# Patient Record
Sex: Male | Born: 1945 | Race: White | Hispanic: No
Health system: Southern US, Community
[De-identification: ages and names within clinical notes are randomized; demographics above are authoritative.]

## PROBLEM LIST (undated history)

## (undated) DIAGNOSIS — E7800 Pure hypercholesterolemia, unspecified: Secondary | ICD-10-CM

## (undated) DIAGNOSIS — K219 Gastro-esophageal reflux disease without esophagitis: Secondary | ICD-10-CM

## (undated) DIAGNOSIS — E119 Type 2 diabetes mellitus without complications: Secondary | ICD-10-CM

## (undated) DIAGNOSIS — I639 Cerebral infarction, unspecified: Secondary | ICD-10-CM

## (undated) DIAGNOSIS — IMO0001 Reserved for inherently not codable concepts without codable children: Secondary | ICD-10-CM

## (undated) DIAGNOSIS — I10 Essential (primary) hypertension: Secondary | ICD-10-CM

## (undated) DIAGNOSIS — K922 Gastrointestinal hemorrhage, unspecified: Secondary | ICD-10-CM

## (undated) DIAGNOSIS — M3313 Other dermatomyositis without myopathy: Secondary | ICD-10-CM

## (undated) DIAGNOSIS — K2970 Gastritis, unspecified, without bleeding: Secondary | ICD-10-CM

## (undated) DIAGNOSIS — K649 Unspecified hemorrhoids: Secondary | ICD-10-CM

## (undated) DIAGNOSIS — M3390 Dermatopolymyositis, unspecified, organ involvement unspecified: Secondary | ICD-10-CM

## (undated) HISTORY — DX: Other dermatomyositis without myopathy: M33.13

## (undated) HISTORY — DX: Cerebral infarction, unspecified: I63.9

## (undated) HISTORY — DX: Dermatopolymyositis, unspecified, organ involvement unspecified: M33.90

## (undated) HISTORY — DX: Type 2 diabetes mellitus without complications: E11.9

## (undated) HISTORY — DX: Gastritis, unspecified, without bleeding: K29.70

## (undated) HISTORY — DX: Gastrointestinal hemorrhage, unspecified: K92.2

---

## 2001-07-01 ENCOUNTER — Ambulatory Visit (HOSPITAL_COMMUNITY): Admission: RE | Admit: 2001-07-01 | Discharge: 2001-07-01 | Payer: Self-pay | Admitting: Family Medicine

## 2001-07-01 ENCOUNTER — Encounter: Payer: Self-pay | Admitting: Family Medicine

## 2001-07-04 ENCOUNTER — Ambulatory Visit (HOSPITAL_COMMUNITY): Admission: RE | Admit: 2001-07-04 | Discharge: 2001-07-04 | Payer: Self-pay | Admitting: Family Medicine

## 2001-07-04 ENCOUNTER — Encounter: Payer: Self-pay | Admitting: Family Medicine

## 2001-07-12 ENCOUNTER — Ambulatory Visit (HOSPITAL_COMMUNITY): Admission: RE | Admit: 2001-07-12 | Discharge: 2001-07-12 | Payer: Self-pay | Admitting: Family Medicine

## 2001-07-12 ENCOUNTER — Encounter: Payer: Self-pay | Admitting: Family Medicine

## 2003-08-03 ENCOUNTER — Ambulatory Visit (HOSPITAL_COMMUNITY): Admission: RE | Admit: 2003-08-03 | Discharge: 2003-08-03 | Payer: Self-pay | Admitting: Family Medicine

## 2003-10-19 ENCOUNTER — Ambulatory Visit (HOSPITAL_COMMUNITY): Admission: RE | Admit: 2003-10-19 | Discharge: 2003-10-19 | Payer: Self-pay | Admitting: Internal Medicine

## 2004-08-13 ENCOUNTER — Other Ambulatory Visit: Admission: RE | Admit: 2004-08-13 | Discharge: 2004-08-13 | Payer: Self-pay | Admitting: Dermatology

## 2006-08-09 ENCOUNTER — Ambulatory Visit (HOSPITAL_COMMUNITY): Admission: RE | Admit: 2006-08-09 | Discharge: 2006-08-09 | Payer: Self-pay | Admitting: Family Medicine

## 2007-07-04 ENCOUNTER — Ambulatory Visit (HOSPITAL_COMMUNITY): Admission: RE | Admit: 2007-07-04 | Discharge: 2007-07-04 | Payer: Self-pay | Admitting: *Deleted

## 2007-08-22 ENCOUNTER — Ambulatory Visit (HOSPITAL_COMMUNITY): Admission: RE | Admit: 2007-08-22 | Discharge: 2007-08-22 | Payer: Self-pay | Admitting: Family Medicine

## 2009-12-12 HISTORY — PX: COLONOSCOPY: SHX174

## 2009-12-12 HISTORY — PX: ESOPHAGOGASTRODUODENOSCOPY: SHX1529

## 2009-12-20 ENCOUNTER — Encounter (INDEPENDENT_AMBULATORY_CARE_PROVIDER_SITE_OTHER): Payer: Self-pay

## 2009-12-20 ENCOUNTER — Inpatient Hospital Stay (HOSPITAL_COMMUNITY): Admission: EM | Admit: 2009-12-20 | Discharge: 2009-12-22 | Payer: Self-pay | Source: Home / Self Care

## 2010-01-16 ENCOUNTER — Encounter: Payer: Self-pay | Admitting: Gastroenterology

## 2010-01-16 ENCOUNTER — Ambulatory Visit
Admission: RE | Admit: 2010-01-16 | Discharge: 2010-01-16 | Payer: Self-pay | Source: Home / Self Care | Attending: Gastroenterology | Admitting: Gastroenterology

## 2010-01-16 DIAGNOSIS — K922 Gastrointestinal hemorrhage, unspecified: Secondary | ICD-10-CM | POA: Insufficient documentation

## 2010-01-16 DIAGNOSIS — K219 Gastro-esophageal reflux disease without esophagitis: Secondary | ICD-10-CM | POA: Insufficient documentation

## 2010-01-29 ENCOUNTER — Encounter (INDEPENDENT_AMBULATORY_CARE_PROVIDER_SITE_OTHER): Payer: Self-pay

## 2010-01-29 LAB — CONVERTED CEMR LAB
Basophils Relative: 1 % (ref 0–1)
Eosinophils Absolute: 0.2 10*3/uL (ref 0.0–0.7)
Eosinophils Relative: 1 % (ref 0–5)
HCT: 36.5 % — ABNORMAL LOW (ref 39.0–52.0)
Lymphs Abs: 2.4 10*3/uL (ref 0.7–4.0)
MCHC: 31 g/dL (ref 30.0–36.0)
MCV: 88.8 fL (ref 78.0–100.0)
Platelets: 357 10*3/uL (ref 150–400)
WBC: 14.6 10*3/uL — ABNORMAL HIGH (ref 4.0–10.5)

## 2010-02-04 ENCOUNTER — Telehealth (INDEPENDENT_AMBULATORY_CARE_PROVIDER_SITE_OTHER): Payer: Self-pay

## 2010-02-13 NOTE — Progress Notes (Signed)
Summary: phone note/ in response to letter received  Phone Note Call from Patient   Caller: Patient Summary of Call: Pt received letter and returned call. (See lab and appends 01/16/2010 ) York Spaniel he was sick about that time, with a head cold that moved to his chest...Marland Kitchenhe had a cough for couple of weeks but is much improved now. Appreciated [place] checking on  him. Initial call taken by: Cloria Spring LPN,  February 04, 2010 10:23 AM

## 2010-02-13 NOTE — Assessment & Plan Note (Signed)
Summary: pt seen in ER recently,had EGD + TCS/told to fu in 2-4 weeks/ss   Visit Type:  Follow-up Visit Referring Provider:  ER Primary Care Provider:  Phillips Odor  CC:  F/U procedures.  History of Present Illness: Mr. David Pruitt is a pleasant 65 year old male who presents in hospital follow-up. was in [facility] from 12/9-12/11 secondary to lower GI bleed. Underwent upper endoscopy on 12/9 showing gastritis/duodenitis. Subsequently underwent colonoscopy 12/10 where source of bleed was noted to be secondary to diverticula, requiring clips. Biopsy negative for H. pylori. H/H stable at discharge at 9.6./28.3. Pt reports doing well. Has occasional reflux at night. Taking Protonix each morning. rare reflux during day. has tried pepcid in past and other medications which he is unsure about the name. Denies dysphagia/odynophagia. No melena or hematochezia.   Current Medications (verified): 1)  Plavix 75 Mg Tabs (Clopidogrel Bisulfate) .... Take 1 Tablet By Mouth Once A Day 2)  Januvia/met 1000mg  .... Take 1 Tablet By Mouth Two Times A Day 3)  Bp Med .... One Tablet Daily 4)  Cholesterol Med .... One Tablet Daily  Allergies (verified): No Known Drug Allergies  Past History:  Past Medical History: CVA DM EGD/colon: Dec 2011: gastritis, duodenitis, lower GI bleed secondary to diverticula, s/p bleed  Past Surgical History: None  Family History: +prostate ca No FH of colon ca or polyps  Social History: Married 2 biological children, 2 step-children Denies tobacco use ETOH once/week  Review of Systems General:  Denies fever, chills, and anorexia. Eyes:  Denies blurring, irritation, and discharge. ENT:  Denies sore throat, hoarseness, and difficulty swallowing. CV:  Denies chest pains and syncope. Resp:  Denies dyspnea at rest and wheezing. GI:  Complains of indigestion/heartburn; denies difficulty swallowing, pain on swallowing, nausea, abdominal pain, diarrhea, constipation, change in bowel  habits, and bloody BM's. GU:  Denies urinary burning, blood in urine, and urinary frequency. MS:  Denies joint pain / LOM, joint swelling, and joint stiffness. Derm:  Denies rash, itching, and dry skin. Neuro:  Denies weakness and syncope. Psych:  Denies depression and anxiety. Endo:  Denies cold intolerance and heat intolerance. Heme:  Denies bruising and bleeding.  Vital Signs:  Patient profile:   65 year old male Height:      70.5 inches Weight:      233 pounds BMI:     33.08 Temp:     98.9 degrees F oral Pulse rate:   76 / minute BP sitting:   130 / 92  (left arm) Cuff size:   large  Vitals Entered By: Cloria Spring LPN (January 16, 2010 11:24 AM)  Impression & Recommendations:  Problem # 1:  GASTROINTESTINAL HEMORRHAGE (ICD-58.57) 65 year old male recently admitted to hospital 12/9-12/11 secondary to lower GI bleed. EGD: gastritis/duodenitis. Colonoscopy showed source of bleed from single diverticula, s/p clips. Stable at d/c, denies any melena/hematochezia. No abdominal pain. Normal BMs. Stable.  Check CBC today  Orders: T-CBC w/Diff ([id number]) Est. Patient Level II (45409)  Problem # 2:  GERD (ICD-530.81) Hx of GERD, d/c home on protonix, still with occasional break-through symptoms. EGD showed gastritis/duodenitis.  Dexilant Samples, rx handwritten as well for 90 day supply with 4 refills, savings card given Wt loss of approximately 1-2# per week Follow-up 1 year or sooner as needed  Orders: T-CBC w/Diff ([id number]) Est. Patient Level II (56213)

## 2010-02-13 NOTE — Letter (Signed)
Summary: Plan of Care, Need to Discuss  [facility]  [address]   [place], [place] [address]   Phone: [phone]  Fax: [phone]    January 29, 2010  David Pruitt [address] [place], [place]  [address] [birth year]   Dear David Pruitt,   We are writing this letter to inform you of treatment plans and/or discuss your plan of care.  We have tried several times to contact you; however, we have yet to reach you.  We ask that you please contact our office for follow-up on your gastrointestinal issues.  We can  be reached at [phone] to schedule an appointment, or to speak with someone regarding your health care needs.  Please do not neglect your health.   Sincerely,    Cloria Spring LPN  [facility] Ph: [phone]    Fax: [phone]

## 2010-03-25 LAB — CBC
Hemoglobin: 12 g/dL — ABNORMAL LOW (ref 13.0–17.0)
Hemoglobin: 9.6 g/dL — ABNORMAL LOW (ref 13.0–17.0)
MCH: 30.7 pg (ref 26.0–34.0)
MCH: 31.2 pg (ref 26.0–34.0)
MCHC: 33.9 g/dL (ref 30.0–36.0)
MCV: 90.4 fL (ref 78.0–100.0)
Platelets: 208 10*3/uL (ref 150–400)
RBC: 3.13 MIL/uL — ABNORMAL LOW (ref 4.22–5.81)
RBC: 3.85 MIL/uL — ABNORMAL LOW (ref 4.22–5.81)

## 2010-03-25 LAB — DIFFERENTIAL
Basophils Relative: 1 % (ref 0–1)
Eosinophils Absolute: 0.2 10*3/uL (ref 0.0–0.7)
Eosinophils Absolute: 0.2 10*3/uL (ref 0.0–0.7)
Eosinophils Relative: 2 % (ref 0–5)
Lymphs Abs: 1.8 10*3/uL (ref 0.7–4.0)
Monocytes Absolute: 1.2 10*3/uL — ABNORMAL HIGH (ref 0.1–1.0)
Monocytes Relative: 11 % (ref 3–12)
Monocytes Relative: 11 % (ref 3–12)
Neutro Abs: 5.2 10*3/uL (ref 1.7–7.7)
Neutrophils Relative %: 65 % (ref 43–77)

## 2010-03-25 LAB — HEMOGLOBIN AND HEMATOCRIT, BLOOD
HCT: 28.6 % — ABNORMAL LOW (ref 39.0–52.0)
HCT: 31.3 % — ABNORMAL LOW (ref 39.0–52.0)
Hemoglobin: 10 g/dL — ABNORMAL LOW (ref 13.0–17.0)
Hemoglobin: 10.3 g/dL — ABNORMAL LOW (ref 13.0–17.0)
Hemoglobin: 11.1 g/dL — ABNORMAL LOW (ref 13.0–17.0)

## 2010-03-25 LAB — BASIC METABOLIC PANEL
CO2: 26 mEq/L (ref 19–32)
CO2: 27 mEq/L (ref 19–32)
Calcium: 8.5 mg/dL (ref 8.4–10.5)
Chloride: 102 mEq/L (ref 96–112)
Creatinine, Ser: 0.91 mg/dL (ref 0.4–1.5)
Creatinine, Ser: 1.26 mg/dL (ref 0.4–1.5)
GFR calc Af Amer: >60 mL/min (ref >60)
GFR calc Af Amer: >60 mL/min (ref >60)
GFR calc non Af Amer: >60 mL/min (ref >60)
Glucose, Bld: 157 mg/dL — ABNORMAL HIGH (ref 70–99)
Glucose, Bld: 158 mg/dL — ABNORMAL HIGH (ref 70–99)
Sodium: 139 mEq/L (ref 135–145)
Sodium: 139 mEq/L (ref 135–145)

## 2010-03-25 LAB — APTT: aPTT: 29 seconds (ref 24–37)

## 2010-03-25 LAB — GLUCOSE, CAPILLARY: Glucose-Capillary: 154 mg/dL — ABNORMAL HIGH (ref 70–99)

## 2010-03-25 LAB — PROTIME-INR: INR: 1 (ref 0.00–1.49)

## 2010-03-25 LAB — CROSSMATCH: ABO/RH(D): O POS

## 2010-03-25 LAB — HEPATIC FUNCTION PANEL: ALT: 51 U/L (ref 0–53)

## 2010-04-17 ENCOUNTER — Encounter: Payer: Self-pay | Admitting: Internal Medicine

## 2010-05-30 NOTE — Op Note (Signed)
NAME:  David Pruitt, David Pruitt             ACCOUNT NO.:  [id number]   MEDICAL RECORD NO.:  [id number]          PATIENT TYPE:  AMB   LOCATION:  DAY                           FACILITY:  [facility]   PHYSICIAN:  Lionel December, M.D.    DATE OF BIRTH:  February 01, 1945   DATE OF PROCEDURE:  10/19/2003  DATE OF DISCHARGE:                                 OPERATIVE REPORT   PROCEDURE:  Total colonoscopy.   INDICATIONS:  The patient is a 65 year old Caucasian male who is undergoing  screening colonoscopy.  He is not sure whether his father died of prostate  or colon carcinoma but he will find that out so that the family history is  fairly known.  Procedure risk reviewed with the patient and informed consent  was obtained.   PREOPERATIVE MEDICATIONS:  Demerol 50 mg IV, Versed 5 mg IV.   FINDINGS:  The procedure was performed in the endoscopy suite.  The  patient's vital signs and O2 saturation were monitored during the procedure  and remained stable.  The patient was placed in the left lateral recumbent  position.  Rectal examination was performed.  No abnormalities noted on  external or digital exam.  Preparation was satisfactory to fair.  He had a  lot of stool which had to be suctioned out.  Some areas washed to see the  underlying mucosa.  Scope was passed to the cecum.  Cecum was identified by  ileocecal valve and appendiceal orifice.  Pictures taken for the record.  There were a few small scattered diverticula at the sigmoid colon.   There were three small polyps, one at the cecum, second one at the  descending colon and third one at the sigmoid, all of which were ablated via  cold biopsy.  There was a swollen linear fold at the cecum which was  biopsied for histology, and there was another one at the mid transverse  colon which was also biopsied for histology.  Both of these were suspicious  for hyperplastic,  Mucoscopically did not appear to be edematous.  Mucosa  and the rest of the colon was normal.   Rectal mucosa similarly was normal.  Scope was retroflexed to examine anorectal junction and he had hemorrhoids  above and below the dentate line with erythema involving the internal  hemorrhoids.  Pictures taken for the record.  Endoscope was withdrawn.  The  patient tolerated the procedure well.   FINAL DIAGNOSIS:  1.  Three small polyps that were obliterated by cold biopsy (cecum,      transverse colon, and descending colon, and sigmoid).  2.  Swollen fold at cecum and transverse colon, biopsied for histology.      These changes are possibly hyperplastic.  3.  A few diverticula at the sigmoid.  4.  Internal and external hemorrhoids.   RECOMMENDATIONS:  1.  Standard instructions given.  2.  High-fiber diet.  3.  I will contact the patient with biopsy results and further      recommendations.  4.  He will resume his usual meds as before.     Naje  NR/MEDQ  D:  10/19/2003  T:  10/19/2003  Job:  [id number]   cc:   Kirk Ruths, M.D.  [address]  [place]  [place] [address]  Fax: [phone]

## 2010-08-15 ENCOUNTER — Other Ambulatory Visit: Payer: Self-pay | Admitting: Orthopedic Surgery

## 2010-08-15 ENCOUNTER — Ambulatory Visit
Admission: RE | Admit: 2010-08-15 | Discharge: 2010-08-15 | Disposition: A | Discharge status: Home / Self Care | Payer: 59 | Source: Ambulatory Visit | Attending: Orthopedic Surgery | Admitting: Orthopedic Surgery

## 2010-08-15 DIAGNOSIS — IMO0002 Reserved for concepts with insufficient information to code with codable children: Secondary | ICD-10-CM

## 2010-12-30 ENCOUNTER — Encounter: Payer: Self-pay | Admitting: Internal Medicine

## 2012-01-13 IMAGING — CR DG CHEST 1V PORT
1 series · 1 of 1 positions shown · non-contrast
Comparison: 08/22/2007

CLINICAL DATA: Shortness of breath.

PORTABLE CHEST - 1 VIEW

[view not recorded]
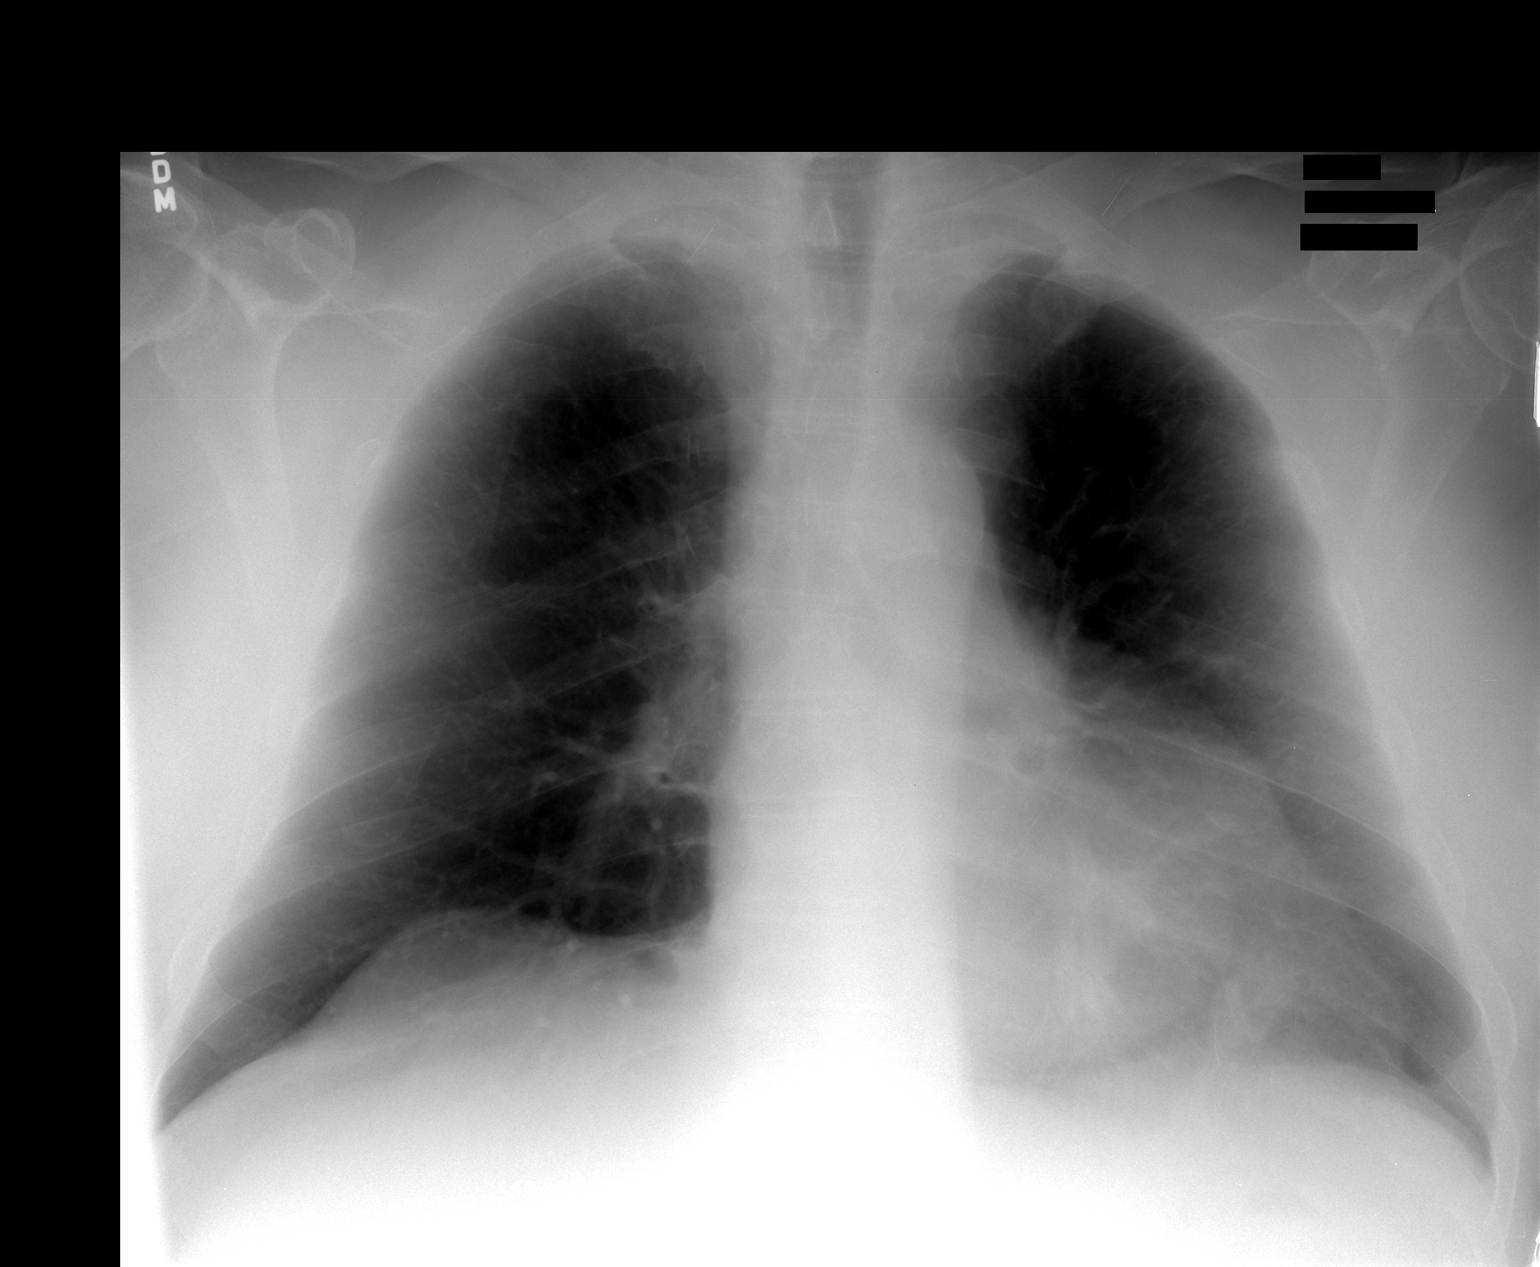

[1 of 1 positions shown; findings below may reference images not displayed]

FINDINGS: Heart is mildly enlarged.  There is mild peribronchial
thickening.  Chronic densities are noted in the left lung base,
unchanged.  No acute opacities or effusions.
IMPRESSION: Mild cardiomegaly.  Bronchitic changes.

## 2012-12-13 ENCOUNTER — Other Ambulatory Visit (HOSPITAL_COMMUNITY): Payer: Self-pay | Admitting: Orthopedic Surgery

## 2012-12-19 ENCOUNTER — Ambulatory Visit (HOSPITAL_COMMUNITY): Payer: 59

## 2013-03-17 ENCOUNTER — Ambulatory Visit (HOSPITAL_COMMUNITY)
Admission: RE | Admit: 2013-03-17 | Discharge: 2013-03-17 | Disposition: A | Discharge status: Home / Self Care | Payer: 59 | Source: Ambulatory Visit | Attending: Family Medicine | Admitting: Family Medicine

## 2013-03-17 ENCOUNTER — Other Ambulatory Visit (HOSPITAL_COMMUNITY): Payer: Self-pay | Admitting: Family Medicine

## 2013-03-17 DIAGNOSIS — I10 Essential (primary) hypertension: Secondary | ICD-10-CM

## 2013-03-17 DIAGNOSIS — E119 Type 2 diabetes mellitus without complications: Secondary | ICD-10-CM

## 2013-03-17 DIAGNOSIS — R05 Cough: Secondary | ICD-10-CM | POA: Insufficient documentation

## 2013-03-17 DIAGNOSIS — R059 Cough, unspecified: Secondary | ICD-10-CM | POA: Insufficient documentation

## 2013-03-17 DIAGNOSIS — J438 Other emphysema: Secondary | ICD-10-CM | POA: Insufficient documentation

## 2013-03-17 DIAGNOSIS — R0989 Other specified symptoms and signs involving the circulatory and respiratory systems: Secondary | ICD-10-CM | POA: Insufficient documentation

## 2013-03-17 DIAGNOSIS — R0609 Other forms of dyspnea: Secondary | ICD-10-CM | POA: Insufficient documentation

## 2013-03-17 DIAGNOSIS — E785 Hyperlipidemia, unspecified: Secondary | ICD-10-CM

## 2013-03-17 DIAGNOSIS — F17200 Nicotine dependence, unspecified, uncomplicated: Secondary | ICD-10-CM | POA: Insufficient documentation

## 2014-03-28 ENCOUNTER — Telehealth (INDEPENDENT_AMBULATORY_CARE_PROVIDER_SITE_OTHER): Payer: Self-pay | Admitting: *Deleted

## 2014-03-28 NOTE — Telephone Encounter (Signed)
Last exam was over 10 years ago. He therefore needs one this year. Please call patient.

## 2014-03-28 NOTE — Telephone Encounter (Signed)
Last TCS was 10/17/2003 - When is his next TCS due? Patient's return phone number is [phone].

## 2014-04-03 ENCOUNTER — Other Ambulatory Visit (INDEPENDENT_AMBULATORY_CARE_PROVIDER_SITE_OTHER): Payer: Self-pay | Admitting: *Deleted

## 2014-04-03 DIAGNOSIS — Z1211 Encounter for screening for malignant neoplasm of colon: Secondary | ICD-10-CM

## 2014-04-03 NOTE — Telephone Encounter (Signed)
TCS sch'd 05/24/14, patient aware

## 2014-04-13 ENCOUNTER — Telehealth (INDEPENDENT_AMBULATORY_CARE_PROVIDER_SITE_OTHER): Payer: Self-pay | Admitting: *Deleted

## 2014-04-13 DIAGNOSIS — Z1211 Encounter for screening for malignant neoplasm of colon: Secondary | ICD-10-CM

## 2014-04-13 MED ORDER — PEG 3350-KCL-NA BICARB-NACL 420 G PO SOLR: 4000.0000 mL | Freq: Once | ORAL | Status: DC

## 2014-04-13 NOTE — Telephone Encounter (Signed)
Patient needs trilyte 

## 2014-04-23 ENCOUNTER — Telehealth (INDEPENDENT_AMBULATORY_CARE_PROVIDER_SITE_OTHER): Payer: Self-pay | Admitting: *Deleted

## 2014-04-23 NOTE — Telephone Encounter (Signed)
Referring MD/PCP: golding   Procedure: tcs  Reason/Indication:  screening  Has patient had this procedure before?  Yes, 2005  If so, when, by whom and where?    Is there a family history of colon cancer?  no  Who?  What age when diagnosed?    Is patient diabetic?   yes      Does patient have prosthetic heart valve?  no  Do you have a pacemaker?  no  Has patient ever had endocarditis? no  Has patient had joint replacement within last 12 months?  no  Does patient tend to be constipated or take laxatives? no  Is patient on Coumadin, Plavix and/or Aspirin? yes  Medications: plavix 75 mg daily, janumet 50/1000 mg bid, glipizide 5 mg daily, lisinopril 10 mg daily, vytorin 10/20 mg daily, meloxicam 15 mg daily, fiber pill, muti vit, gingko biloba daily, chromium daily, biotin daily, garlic tab daily, co q 10 daily  Allergies: nkda  Medication Adjustment: plavix 5 days, hold diabetic meds evening before and morning of  Procedure date & time: 05/24/14 at 1030

## 2014-04-24 NOTE — Telephone Encounter (Signed)
agree

## 2014-05-24 ENCOUNTER — Encounter (HOSPITAL_COMMUNITY): Payer: Self-pay | Admitting: *Deleted

## 2014-05-24 ENCOUNTER — Ambulatory Visit (HOSPITAL_COMMUNITY)
Admission: RE | Admit: 2014-05-24 | Discharge: 2014-05-24 | Disposition: A | Discharge status: Home / Self Care | Payer: 59 | Source: Ambulatory Visit | Attending: Internal Medicine | Admitting: Internal Medicine

## 2014-05-24 ENCOUNTER — Encounter (HOSPITAL_COMMUNITY)
Admission: RE | Disposition: A | Discharge status: Home / Self Care | Payer: Self-pay | Source: Ambulatory Visit | Attending: Internal Medicine

## 2014-05-24 DIAGNOSIS — Z8673 Personal history of transient ischemic attack (TIA), and cerebral infarction without residual deficits: Secondary | ICD-10-CM | POA: Diagnosis not present

## 2014-05-24 DIAGNOSIS — Z7902 Long term (current) use of antithrombotics/antiplatelets: Secondary | ICD-10-CM | POA: Diagnosis not present

## 2014-05-24 DIAGNOSIS — K5730 Diverticulosis of large intestine without perforation or abscess without bleeding: Secondary | ICD-10-CM | POA: Insufficient documentation

## 2014-05-24 DIAGNOSIS — K219 Gastro-esophageal reflux disease without esophagitis: Secondary | ICD-10-CM | POA: Diagnosis not present

## 2014-05-24 DIAGNOSIS — K648 Other hemorrhoids: Secondary | ICD-10-CM | POA: Diagnosis not present

## 2014-05-24 DIAGNOSIS — Z1211 Encounter for screening for malignant neoplasm of colon: Secondary | ICD-10-CM

## 2014-05-24 DIAGNOSIS — Z791 Long term (current) use of non-steroidal anti-inflammatories (NSAID): Secondary | ICD-10-CM | POA: Diagnosis not present

## 2014-05-24 DIAGNOSIS — E780 Pure hypercholesterolemia: Secondary | ICD-10-CM | POA: Insufficient documentation

## 2014-05-24 DIAGNOSIS — K644 Residual hemorrhoidal skin tags: Secondary | ICD-10-CM | POA: Insufficient documentation

## 2014-05-24 DIAGNOSIS — Z09 Encounter for follow-up examination after completed treatment for conditions other than malignant neoplasm: Secondary | ICD-10-CM | POA: Diagnosis present

## 2014-05-24 DIAGNOSIS — I10 Essential (primary) hypertension: Secondary | ICD-10-CM | POA: Insufficient documentation

## 2014-05-24 DIAGNOSIS — Z87891 Personal history of nicotine dependence: Secondary | ICD-10-CM | POA: Insufficient documentation

## 2014-05-24 DIAGNOSIS — E119 Type 2 diabetes mellitus without complications: Secondary | ICD-10-CM | POA: Insufficient documentation

## 2014-05-24 DIAGNOSIS — Z79899 Other long term (current) drug therapy: Secondary | ICD-10-CM | POA: Diagnosis not present

## 2014-05-24 DIAGNOSIS — Z86010 Personal history of colonic polyps: Secondary | ICD-10-CM | POA: Diagnosis not present

## 2014-05-24 HISTORY — DX: Essential (primary) hypertension: I10

## 2014-05-24 HISTORY — DX: Unspecified hemorrhoids: K64.9

## 2014-05-24 HISTORY — DX: Pure hypercholesterolemia, unspecified: E78.00

## 2014-05-24 HISTORY — DX: Reserved for inherently not codable concepts without codable children: IMO0001

## 2014-05-24 HISTORY — PX: COLONOSCOPY: SHX5424

## 2014-05-24 HISTORY — DX: Gastro-esophageal reflux disease without esophagitis: K21.9

## 2014-05-24 HISTORY — DX: Type 2 diabetes mellitus without complications: E11.9

## 2014-05-24 LAB — GLUCOSE, CAPILLARY: Glucose-Capillary: 157 mg/dL — ABNORMAL HIGH (ref 65–99)

## 2014-05-24 SURGERY — COLONOSCOPY
Anesthesia: Moderate Sedation

## 2014-05-24 MED ORDER — SODIUM CHLORIDE 0.9 % IV SOLN
INTRAVENOUS | Status: DC
  Administered 2014-05-24: 10:00:00 via INTRAVENOUS

## 2014-05-24 MED ORDER — MIDAZOLAM HCL 5 MG/5ML IJ SOLN
INTRAMUSCULAR | Status: AC
  Filled 2014-05-24: qty 10

## 2014-05-24 MED ORDER — MEPERIDINE HCL 50 MG/ML IJ SOLN
INTRAMUSCULAR | Status: DC | PRN
Start: 2014-05-24 — End: 2014-05-24
  Administered 2014-05-24 (×2): 25 mg via INTRAVENOUS

## 2014-05-24 MED ORDER — MIDAZOLAM HCL 5 MG/5ML IJ SOLN
INTRAMUSCULAR | Status: DC | PRN
  Administered 2014-05-24: 1 mg via INTRAVENOUS
  Administered 2014-05-24: 2 mg via INTRAVENOUS
  Administered 2014-05-24: 1 mg via INTRAVENOUS
  Administered 2014-05-24: 2 mg via INTRAVENOUS

## 2014-05-24 MED ORDER — STERILE WATER FOR IRRIGATION IR SOLN
Status: DC | PRN
  Administered 2014-05-24: 10:00:00

## 2014-05-24 MED ORDER — MEPERIDINE HCL 50 MG/ML IJ SOLN
INTRAMUSCULAR | Status: AC
  Filled 2014-05-24: qty 1

## 2014-05-24 NOTE — OR Nursing (Signed)
1016 - [phone] Patient's respiration rate did not flow to CHL. Patient's respirations within normal limits during procedure. No complications noted. Ticket called in to Help Desk to assist with resolution.

## 2014-05-24 NOTE — Discharge Instructions (Signed)
Resume usual medications and high fiber diet. No driving for 24 hours. May wait 7 years before next colonoscopy..  Colonoscopy, Care After Refer to this sheet in the next few weeks. These instructions provide you with information on caring for yourself after your procedure. Your health care provider may also give you more specific instructions. Your treatment has been planned according to current medical practices, but problems sometimes occur. Call your health care provider if you have any problems or questions after your procedure. WHAT TO EXPECT AFTER THE PROCEDURE  After your procedure, it is typical to have the following:  A small amount of blood in your stool.  Moderate amounts of gas and mild abdominal cramping or bloating. HOME CARE INSTRUCTIONS  Do not drive, operate machinery, or sign important documents for 24 hours.  You may shower and resume your regular physical activities, but move at a slower pace for the first 24 hours.  Take frequent rest periods for the first 24 hours.  Walk around or put a warm pack on your abdomen to help reduce abdominal cramping and bloating.  Drink enough fluids to keep your urine clear or pale yellow.  You may resume your normal diet as instructed by your health care provider. Avoid heavy or fried foods that are hard to digest.  Avoid drinking alcohol for 24 hours or as instructed by your health care provider.  Only take over-the-counter or prescription medicines as directed by your health care provider.  If a tissue sample (biopsy) was taken during your procedure:  Do not take aspirin or blood thinners for 7 days, or as instructed by your health care provider.  Do not drink alcohol for 7 days, or as instructed by your health care provider.  Eat soft foods for the first 24 hours. SEEK MEDICAL CARE IF: You have persistent spotting of blood in your stool 2-3 days after the procedure. SEEK IMMEDIATE MEDICAL CARE IF:  You have more than a  small spotting of blood in your stool.  You pass large blood clots in your stool.  Your abdomen is swollen (distended).  You have nausea or vomiting.  You have a fever.  You have increasing abdominal pain that is not relieved with medicine. Document Released: 08/13/2003 Document Revised: 10/19/2012 Document Reviewed: 09/05/2012 [facility] Patient Information 2015 [place], [place]. This information is not intended to replace advice given to you by your health care provider. Make sure you discuss any questions you have with your health care provider.

## 2014-05-24 NOTE — Op Note (Signed)
COLONOSCOPY PROCEDURE REPORT  PATIENT:  David Pruitt  MR#:  [id number] Birthdate:  07/14/1945, 69 y.o., male Endoscopist:  Dr. Rogene Houston, MD Referred By:  Dr. Purvis Kilts, MD  Procedure Date: 05/24/2014  Procedure:   Colonoscopy  Indications:  Patient is 69 year old Caucasian male with history of colonic polyps. These were found on colonoscopy of 2005. He did not have any polyps when he had diagnostic colonoscopy for diverticular bleed in 2011. He is returning for surveillance colonoscopy. He does not have any GI symptoms in the form of abdominal pain change in bowel habits or rectal bleeding.  Informed Consent:  The procedure and risks were reviewed with the patient and informed consent was obtained.  Medications:  Demerol 50 mg IV Versed 6 mg IV  Description of procedure:  After a digital rectal exam was performed, that colonoscope was advanced from the anus through the rectum and colon to the area of the cecum, ileocecal valve and appendiceal orifice. The cecum was deeply intubated. These structures were well-seen and photographed for the record. From the level of the cecum and ileocecal valve, the scope was slowly and cautiously withdrawn. The mucosal surfaces were carefully surveyed utilizing scope tip to flexion to facilitate fold flattening as needed. The scope was pulled down into the rectum where a thorough exam including retroflexion was performed.  Findings:   Prep satisfactory. Scattered diverticula noted at sigmoid colon. Normal rectal mucosa. Small hemorrhoids noted below the dentate line.    Therapeutic/Diagnostic Maneuvers Performed:   None  Complications:  None  Cecal Withdrawal Time:  15 minutes  Impression:  Examination performed to cecum. Mild sigmoid colon diverticulosis. Small external hemorrhoids. No evidence of recurrent polyps.  Recommendations:  Standard instructions given. High fiber diet. May wait 7 years before next  colonoscopy.  REHMAN,NAJEEB U  05/24/2014 11:11 AM  CC: Dr. Hilma Favors, Betsy Coder, MD & Dr. Rayne Du ref. provider found

## 2014-05-24 NOTE — H&P (Signed)
David Pruitt is an 69 y.o. male.   Chief Complaint: Patient is here for colonoscopy. HPI: Patient is 69 year old Caucasian male with history of colonic polyps and is here for surveillance exam. Had 3 polyps removed back in 2005. He underwent colonoscopy in December 2011 for diverticular bleed and no polyp was found. He denies abdominal pain change in bowel habits or rectal bleeding. Family history is negative for CRC.  Past Medical History  Diagnosis Date  . CVA (cerebral infarction)   . DM (dermatomyositis)   . Gastritis   . Lower GI bleed     SECONDARY TO DIVERTICULA,S/P BLEED  . Hypertension   . Hypercholesteremia   . Diabetes mellitus without complication   . GERD (gastroesophageal reflux disease)   . Hemorrhoids   . Shortness of breath dyspnea     Past Surgical History  Procedure Laterality Date  . Esophagogastroduodenoscopy  12/11  . Colonoscopy  12/11    History reviewed. No pertinent family history. Social History:  reports that he has quit smoking. His smoking use included Cigarettes. He has a 40 pack-year smoking history. He does not have any smokeless tobacco history on file. He reports that he drinks alcohol. He reports that he does not use illicit drugs.  Allergies: No Known Allergies  Medications Prior to Admission  Medication Sig Dispense Refill  . clopidogrel (PLAVIX) 75 MG tablet Take 75 mg by mouth daily.    Marland Kitchen ezetimibe-simvastatin (VYTORIN) 10-20 MG per tablet Take 1 tablet by mouth daily.    Marland Kitchen glipiZIDE (GLUCOTROL) 5 MG tablet Take 5 mg by mouth daily before breakfast.    . lisinopril (PRINIVIL,ZESTRIL) 10 MG tablet Take 10 mg by mouth daily.    . meloxicam (MOBIC) 15 MG tablet Take 15 mg by mouth daily.    . Multiple Vitamins-Minerals (MULTIVITAMIN WITH MINERALS) tablet Take 1 tablet by mouth daily.    . polyethylene glycol-electrolytes (NULYTELY/GOLYTELY) 420 G solution Take 4,000 mLs by mouth once. 4000 mL 0  . SitaGLIPtin Phosphate (JANUVIA PO)  Take 1,000 mg by mouth 2 (two) times daily.        Results for orders placed or performed during the hospital encounter of 05/24/14 (from the past 48 hour(s))  Glucose, capillary     Status: Abnormal   Collection Time: 05/24/14  9:48 AM  Result Value Ref Range   Glucose-Capillary 157 (H) 65 - 99 mg/dL   Comment 1 Document in Chart    No results found.  ROS  Blood pressure 151/94, pulse 109, temperature 98.3 F (36.8 C), temperature source Oral, height 5' 10.5" (1.791 m), weight 212 lb (96.163 kg), SpO2 96 %. Physical Exam  Constitutional: He appears well-developed and well-nourished.  HENT:  Mouth/Throat: Oropharynx is clear and moist.  Eyes: Conjunctivae are normal.  Neck: No thyromegaly present.  Cardiovascular: Normal rate and regular rhythm.   No murmur heard. Respiratory: Effort normal and breath sounds normal.  GI: Soft. He exhibits no distension and no mass. There is no tenderness.  Musculoskeletal: He exhibits no edema.  Lymphadenopathy:    He has no cervical adenopathy.  Neurological: He is alert.  Skin: Skin is warm and dry.     Assessment/Plan History of colonic polyps. Surveillance colonoscopy.  REHMAN,NAJEEB U 05/24/2014, 10:26 AM

## 2014-05-25 ENCOUNTER — Encounter (HOSPITAL_COMMUNITY): Payer: Self-pay | Admitting: Internal Medicine

## 2015-08-02 DIAGNOSIS — N480 Leukoplakia of penis: Secondary | ICD-10-CM | POA: Insufficient documentation

## 2015-08-16 DIAGNOSIS — Z87891 Personal history of nicotine dependence: Secondary | ICD-10-CM | POA: Insufficient documentation

## 2015-08-16 DIAGNOSIS — G4733 Obstructive sleep apnea (adult) (pediatric): Secondary | ICD-10-CM | POA: Insufficient documentation

## 2015-08-16 DIAGNOSIS — E119 Type 2 diabetes mellitus without complications: Secondary | ICD-10-CM | POA: Insufficient documentation

## 2016-11-28 ENCOUNTER — Encounter (HOSPITAL_COMMUNITY): Payer: Self-pay | Admitting: Emergency Medicine

## 2016-11-28 ENCOUNTER — Emergency Department (HOSPITAL_COMMUNITY)
Admission: EM | Admit: 2016-11-28 | Discharge: 2016-11-28 | Disposition: A | Discharge status: Home / Self Care | Payer: 59 | Attending: Emergency Medicine | Admitting: Emergency Medicine

## 2016-11-28 DIAGNOSIS — K047 Periapical abscess without sinus: Secondary | ICD-10-CM | POA: Diagnosis not present

## 2016-11-28 DIAGNOSIS — Z87891 Personal history of nicotine dependence: Secondary | ICD-10-CM | POA: Diagnosis not present

## 2016-11-28 DIAGNOSIS — Z79899 Other long term (current) drug therapy: Secondary | ICD-10-CM | POA: Insufficient documentation

## 2016-11-28 DIAGNOSIS — Z7984 Long term (current) use of oral hypoglycemic drugs: Secondary | ICD-10-CM | POA: Diagnosis not present

## 2016-11-28 DIAGNOSIS — E119 Type 2 diabetes mellitus without complications: Secondary | ICD-10-CM | POA: Diagnosis not present

## 2016-11-28 DIAGNOSIS — I10 Essential (primary) hypertension: Secondary | ICD-10-CM | POA: Diagnosis not present

## 2016-11-28 DIAGNOSIS — K0889 Other specified disorders of teeth and supporting structures: Secondary | ICD-10-CM | POA: Diagnosis present

## 2016-11-28 LAB — CBC WITH DIFFERENTIAL/PLATELET
Basophils Absolute: 0 10*3/uL (ref 0.0–0.1)
Basophils Relative: 0 %
EOS PCT: 0 %
Eosinophils Absolute: 0 10*3/uL (ref 0.0–0.7)
HEMATOCRIT: 45.5 % (ref 39.0–52.0)
Hemoglobin: 14.7 g/dL (ref 13.0–17.0)
LYMPHS ABS: 0.8 10*3/uL (ref 0.7–4.0)
LYMPHS PCT: 7 %
MCH: 30.2 pg (ref 26.0–34.0)
MCHC: 32.3 g/dL (ref 30.0–36.0)
MCV: 93.6 fL (ref 78.0–100.0)
MONO ABS: 0.5 10*3/uL (ref 0.1–1.0)
Monocytes Relative: 4 %
NEUTROS ABS: 10.2 10*3/uL — AB (ref 1.7–7.7)
Neutrophils Relative %: 89 %
Platelets: 178 10*3/uL (ref 150–400)
RBC: 4.86 MIL/uL (ref 4.22–5.81)
RDW: 14 % (ref 11.5–15.5)
WBC: 11.5 10*3/uL — ABNORMAL HIGH (ref 4.0–10.5)

## 2016-11-28 LAB — BASIC METABOLIC PANEL
ANION GAP: 11 (ref 5–15)
BUN: 20 mg/dL (ref 6–20)
CHLORIDE: 101 mmol/L (ref 101–111)
CO2: 27 mmol/L (ref 22–32)
CREATININE: 1.28 mg/dL — AB (ref 0.61–1.24)
Calcium: 10.1 mg/dL (ref 8.9–10.3)
GFR calc non Af Amer: 55 mL/min — ABNORMAL LOW (ref >60)
Glucose, Bld: 115 mg/dL — ABNORMAL HIGH (ref 65–99)
POTASSIUM: 4.1 mmol/L (ref 3.5–5.1)
SODIUM: 139 mmol/L (ref 135–145)

## 2016-11-28 NOTE — ED Provider Notes (Signed)
[facility] EMERGENCY DEPARTMENT Provider Note   CSN: [id number] Arrival date & time: 11/28/16  David Pruitt     History   Chief Complaint Chief Complaint  Patient presents with  . Dental Pain  . Fever    HPI David Pruitt is a 71 y.o. male.  Patient states that he has a painful tooth that is swelling worse and he wanted to be checked in the emergency department although he has started clindamycin today   The history is provided by the patient.  Dental Pain   This is a new problem. The current episode started 12 to 24 hours ago. The problem occurs constantly. The problem has not changed since onset.The pain is at a severity of 5/10. The pain is moderate. Treatments tried: Antibiotic.    Past Medical History:  Diagnosis Date  . CVA (cerebral infarction)   . Diabetes mellitus without complication ([place])   . DM (dermatomyositis)   . Gastritis   . GERD (gastroesophageal reflux disease)   . Hemorrhoids   . Hypercholesteremia   . Hypertension   . Lower GI bleed    SECONDARY TO DIVERTICULA,S/P BLEED  . Shortness of breath dyspnea     Patient Active Problem List   Diagnosis Date Noted  . GERD 01/16/2010  . GASTROINTESTINAL HEMORRHAGE 01/16/2010    Past Surgical History:  Procedure Laterality Date  . COLONOSCOPY  12/11  . COLONOSCOPY N/A 05/24/2014   Performed by Rogene Houston, MD at [facility]  . ESOPHAGOGASTRODUODENOSCOPY  12/11       Home Medications    Prior to Admission medications   Medication Sig Start Date End Date Taking? Authorizing Provider  acetaminophen (TYLENOL) 500 MG tablet Take 1,000 mg every 6 (six) hours as needed by mouth.   Yes [provider]  clindamycin (CLEOCIN) 300 MG capsule Take 300 mg 4 (four) times daily by mouth.   Yes [provider]  clopidogrel (PLAVIX) 75 MG tablet Take 75 mg by mouth daily.   Yes [provider]  ezetimibe-simvastatin (VYTORIN) 10-20 MG per tablet Take 1 tablet by mouth daily.    Yes [provider]  glipiZIDE (GLUCOTROL XL) 5 MG 24 hr tablet Take 5 mg 2 (two) times daily by mouth.   Yes [provider]  lisinopril (PRINIVIL,ZESTRIL) 10 MG tablet Take 10 mg by mouth daily.   Yes [provider]  loratadine (CLARITIN) 10 MG tablet Take 10 mg daily by mouth.   Yes [provider]  meloxicam (MOBIC) 15 MG tablet Take 15 mg by mouth daily.   Yes [provider]  Multiple Vitamins-Minerals (MULTIVITAMIN WITH MINERALS) tablet Take 1 tablet by mouth daily.   Yes [provider]  sitaGLIPtin-metformin (JANUMET) 50-1000 MG tablet Take 1 tablet 2 (two) times daily with a meal by mouth.   Yes [provider]  vitamin B-12 (CYANOCOBALAMIN) 1000 MCG tablet Take 1,000 mcg daily by mouth.   Yes [provider]    Family History History reviewed. No pertinent family history.  Social History Social History   Tobacco Use  . Smoking status: Former Smoker    Packs/day: 1.00    Years: 40.00    Pack years: 40.00    Types: Cigarettes  Substance Use Topics  . Alcohol use: Yes    Comment: occasionally  . Drug use: No     Allergies   Patient has no known allergies.   Review of Systems Review of Systems  Constitutional: Negative for appetite  change and fatigue.  HENT: Negative for congestion, ear discharge and sinus pressure.        Toothache  Eyes: Negative for discharge.  Respiratory: Negative for cough.   Cardiovascular: Negative for chest pain.  Gastrointestinal: Negative for abdominal pain and diarrhea.  Genitourinary: Negative for frequency and hematuria.  Musculoskeletal: Negative for back pain.  Skin: Negative for rash.  Neurological: Negative for seizures and headaches.  Psychiatric/Behavioral: Negative for hallucinations.     Physical Exam Updated Vital Signs BP (!) 172/101 (BP Location: Right Arm)   Pulse (!) 122   Temp 99.6 F (37.6 C) (Oral)   Resp 16   Ht 5\' 10"  (1.778 m)   Wt  95.3 kg (210 lb)   SpO2 100%   BMI 30.13 kg/m   Physical Exam  Constitutional: He is oriented to person, place, and time. He appears well-developed.  HENT:  Head: Normocephalic.  Tender right upper tooth with swelling to go home.  Eyes: Conjunctivae and EOM are normal. No scleral icterus.  Neck: Neck supple. No thyromegaly present.  Cardiovascular: Normal rate and regular rhythm. Exam reveals no gallop and no friction rub.  No murmur heard. Pulmonary/Chest: No stridor. He has no wheezes. He has no rales. He exhibits no tenderness.  Abdominal: He exhibits no distension. There is no tenderness. There is no rebound.  Musculoskeletal: Normal range of motion. He exhibits no edema.  Lymphadenopathy:    He has no cervical adenopathy.  Neurological: He is oriented to person, place, and time. He exhibits normal muscle tone. Coordination normal.  Skin: No rash noted. No erythema.  Psychiatric: He has a normal mood and affect. His behavior is normal.     ED Treatments / Results  Labs (all labs ordered are listed, but only abnormal results are displayed) Labs Reviewed  CBC WITH DIFFERENTIAL/PLATELET - Abnormal; Notable for the following components:      Result Value   WBC 11.5 (*)    Neutro Abs 10.2 (*)    All other components within normal limits  BASIC METABOLIC PANEL - Abnormal; Notable for the following components:   Glucose, Bld 115 (*)    Creatinine, Ser 1.28 (*)    GFR calc non Af Amer 55 (*)    All other components within normal limits    EKG  EKG Interpretation None       Radiology No results found.  Procedures Procedures (including critical care time)  Medications Ordered in ED Medications - No data to display   Initial Impression / Assessment and Plan / ED Course  I have reviewed the triage vital signs and the nursing notes.  Pertinent labs & imaging results that were available during my care of the patient were reviewed by me and considered in my medical  decision making (see chart for details).     Patient with abscess tooth.  He will continue his clindamycin and follow-up with his dentist  Final Clinical Impressions(s) / ED Diagnoses   Final diagnoses:  Dental abscess    ED Discharge Orders    None       David Ferguson, MD 11/28/16 1950

## 2016-11-28 NOTE — Discharge Instructions (Signed)
Take your antibiotic and follow-up with your dentist as planned Monday

## 2016-11-28 NOTE — ED Triage Notes (Signed)
Pt reports right sided dental pain and swelling with fever and generally not feeling well.  Tylenol last taken at 1330.  Wife reports temp of 102 at home.  PCP called in Clindamycin with one dose taken.

## 2017-02-08 ENCOUNTER — Other Ambulatory Visit (HOSPITAL_COMMUNITY): Payer: Self-pay | Admitting: Family Medicine

## 2017-02-16 ENCOUNTER — Other Ambulatory Visit (HOSPITAL_COMMUNITY): Payer: Self-pay | Admitting: Family Medicine

## 2017-02-16 DIAGNOSIS — F17210 Nicotine dependence, cigarettes, uncomplicated: Secondary | ICD-10-CM

## 2017-02-26 DIAGNOSIS — E1165 Type 2 diabetes mellitus with hyperglycemia: Secondary | ICD-10-CM | POA: Diagnosis not present

## 2017-03-01 ENCOUNTER — Ambulatory Visit (HOSPITAL_COMMUNITY): Payer: 59

## 2017-03-03 ENCOUNTER — Ambulatory Visit (HOSPITAL_COMMUNITY): Payer: 59

## 2017-03-12 ENCOUNTER — Ambulatory Visit (HOSPITAL_COMMUNITY): Payer: 59

## 2017-03-19 ENCOUNTER — Ambulatory Visit (HOSPITAL_COMMUNITY): Payer: 59

## 2017-04-09 ENCOUNTER — Ambulatory Visit (HOSPITAL_COMMUNITY)
Admission: RE | Admit: 2017-04-09 | Discharge: 2017-04-09 | Disposition: A | Discharge status: Home / Self Care | Payer: 59 | Source: Ambulatory Visit | Attending: Family Medicine | Admitting: Family Medicine

## 2017-04-09 DIAGNOSIS — D510 Vitamin B12 deficiency anemia due to intrinsic factor deficiency: Secondary | ICD-10-CM | POA: Diagnosis not present

## 2017-04-09 DIAGNOSIS — I700 Atherosclerosis of aorta: Secondary | ICD-10-CM | POA: Insufficient documentation

## 2017-04-09 DIAGNOSIS — J439 Emphysema, unspecified: Secondary | ICD-10-CM | POA: Diagnosis not present

## 2017-04-09 DIAGNOSIS — F17210 Nicotine dependence, cigarettes, uncomplicated: Secondary | ICD-10-CM

## 2017-04-09 DIAGNOSIS — E6609 Other obesity due to excess calories: Secondary | ICD-10-CM | POA: Diagnosis not present

## 2017-04-09 DIAGNOSIS — J984 Other disorders of lung: Secondary | ICD-10-CM | POA: Diagnosis not present

## 2017-06-16 ENCOUNTER — Other Ambulatory Visit (HOSPITAL_COMMUNITY): Payer: Self-pay | Admitting: Family Medicine

## 2017-06-16 DIAGNOSIS — J984 Other disorders of lung: Secondary | ICD-10-CM

## 2017-06-24 ENCOUNTER — Ambulatory Visit (HOSPITAL_COMMUNITY): Payer: 59

## 2017-06-28 ENCOUNTER — Ambulatory Visit (HOSPITAL_COMMUNITY)
Admission: RE | Admit: 2017-06-28 | Discharge: 2017-06-28 | Disposition: A | Discharge status: Home / Self Care | Payer: 59 | Source: Ambulatory Visit | Attending: Family Medicine | Admitting: Family Medicine

## 2017-06-28 ENCOUNTER — Other Ambulatory Visit (HOSPITAL_COMMUNITY): Payer: Self-pay | Admitting: Family Medicine

## 2017-06-28 DIAGNOSIS — J984 Other disorders of lung: Secondary | ICD-10-CM

## 2017-06-28 DIAGNOSIS — J439 Emphysema, unspecified: Secondary | ICD-10-CM | POA: Insufficient documentation

## 2017-06-28 DIAGNOSIS — I2510 Atherosclerotic heart disease of native coronary artery without angina pectoris: Secondary | ICD-10-CM | POA: Insufficient documentation

## 2017-06-28 DIAGNOSIS — Z122 Encounter for screening for malignant neoplasm of respiratory organs: Secondary | ICD-10-CM | POA: Insufficient documentation

## 2017-06-28 DIAGNOSIS — I700 Atherosclerosis of aorta: Secondary | ICD-10-CM | POA: Diagnosis not present

## 2017-09-22 DIAGNOSIS — D510 Vitamin B12 deficiency anemia due to intrinsic factor deficiency: Secondary | ICD-10-CM | POA: Diagnosis not present

## 2017-10-20 DIAGNOSIS — Z6830 Body mass index (BMI) 30.0-30.9, adult: Secondary | ICD-10-CM | POA: Diagnosis not present

## 2017-10-20 DIAGNOSIS — D510 Vitamin B12 deficiency anemia due to intrinsic factor deficiency: Secondary | ICD-10-CM | POA: Diagnosis not present

## 2017-10-20 DIAGNOSIS — Z0001 Encounter for general adult medical examination with abnormal findings: Secondary | ICD-10-CM | POA: Diagnosis not present

## 2017-10-20 DIAGNOSIS — E6609 Other obesity due to excess calories: Secondary | ICD-10-CM | POA: Diagnosis not present

## 2017-10-20 DIAGNOSIS — E1140 Type 2 diabetes mellitus with diabetic neuropathy, unspecified: Secondary | ICD-10-CM | POA: Diagnosis not present

## 2017-10-20 DIAGNOSIS — I10 Essential (primary) hypertension: Secondary | ICD-10-CM | POA: Diagnosis not present

## 2017-10-20 DIAGNOSIS — E782 Mixed hyperlipidemia: Secondary | ICD-10-CM | POA: Diagnosis not present

## 2017-11-04 DIAGNOSIS — Z0001 Encounter for general adult medical examination with abnormal findings: Secondary | ICD-10-CM | POA: Diagnosis not present

## 2017-11-04 DIAGNOSIS — E6609 Other obesity due to excess calories: Secondary | ICD-10-CM | POA: Diagnosis not present

## 2017-11-04 DIAGNOSIS — Z23 Encounter for immunization: Secondary | ICD-10-CM | POA: Diagnosis not present

## 2017-11-04 DIAGNOSIS — Z6830 Body mass index (BMI) 30.0-30.9, adult: Secondary | ICD-10-CM | POA: Diagnosis not present

## 2017-11-17 DIAGNOSIS — C44622 Squamous cell carcinoma of skin of right upper limb, including shoulder: Secondary | ICD-10-CM | POA: Diagnosis not present

## 2017-11-17 DIAGNOSIS — L570 Actinic keratosis: Secondary | ICD-10-CM | POA: Diagnosis not present

## 2017-11-17 DIAGNOSIS — D485 Neoplasm of uncertain behavior of skin: Secondary | ICD-10-CM | POA: Diagnosis not present

## 2017-11-17 DIAGNOSIS — D0462 Carcinoma in situ of skin of left upper limb, including shoulder: Secondary | ICD-10-CM | POA: Diagnosis not present

## 2017-11-17 DIAGNOSIS — D239 Other benign neoplasm of skin, unspecified: Secondary | ICD-10-CM | POA: Diagnosis not present

## 2017-11-17 DIAGNOSIS — Z85828 Personal history of other malignant neoplasm of skin: Secondary | ICD-10-CM | POA: Diagnosis not present

## 2017-11-17 DIAGNOSIS — L819 Disorder of pigmentation, unspecified: Secondary | ICD-10-CM | POA: Diagnosis not present

## 2017-11-17 DIAGNOSIS — D0461 Carcinoma in situ of skin of right upper limb, including shoulder: Secondary | ICD-10-CM | POA: Diagnosis not present

## 2017-12-07 DIAGNOSIS — C44629 Squamous cell carcinoma of skin of left upper limb, including shoulder: Secondary | ICD-10-CM | POA: Diagnosis not present

## 2017-12-23 DIAGNOSIS — C44622 Squamous cell carcinoma of skin of right upper limb, including shoulder: Secondary | ICD-10-CM | POA: Diagnosis not present

## 2017-12-29 DIAGNOSIS — E538 Deficiency of other specified B group vitamins: Secondary | ICD-10-CM | POA: Diagnosis not present

## 2017-12-31 DIAGNOSIS — J069 Acute upper respiratory infection, unspecified: Secondary | ICD-10-CM | POA: Diagnosis not present

## 2017-12-31 DIAGNOSIS — Z6830 Body mass index (BMI) 30.0-30.9, adult: Secondary | ICD-10-CM | POA: Diagnosis not present

## 2017-12-31 DIAGNOSIS — E6609 Other obesity due to excess calories: Secondary | ICD-10-CM | POA: Diagnosis not present

## 2018-02-18 DIAGNOSIS — N401 Enlarged prostate with lower urinary tract symptoms: Secondary | ICD-10-CM | POA: Diagnosis not present

## 2018-02-18 DIAGNOSIS — N138 Other obstructive and reflux uropathy: Secondary | ICD-10-CM | POA: Diagnosis not present

## 2018-02-18 DIAGNOSIS — Z8042 Family history of malignant neoplasm of prostate: Secondary | ICD-10-CM | POA: Diagnosis not present

## 2018-03-29 DIAGNOSIS — J343 Hypertrophy of nasal turbinates: Secondary | ICD-10-CM | POA: Diagnosis not present

## 2018-03-29 DIAGNOSIS — E6609 Other obesity due to excess calories: Secondary | ICD-10-CM | POA: Diagnosis not present

## 2018-03-29 DIAGNOSIS — R05 Cough: Secondary | ICD-10-CM | POA: Diagnosis not present

## 2018-03-29 DIAGNOSIS — E538 Deficiency of other specified B group vitamins: Secondary | ICD-10-CM | POA: Diagnosis not present

## 2018-03-29 DIAGNOSIS — Z1389 Encounter for screening for other disorder: Secondary | ICD-10-CM | POA: Diagnosis not present

## 2018-03-29 DIAGNOSIS — Z6830 Body mass index (BMI) 30.0-30.9, adult: Secondary | ICD-10-CM | POA: Diagnosis not present

## 2018-03-29 DIAGNOSIS — J439 Emphysema, unspecified: Secondary | ICD-10-CM | POA: Diagnosis not present

## 2018-03-29 DIAGNOSIS — R070 Pain in throat: Secondary | ICD-10-CM | POA: Diagnosis not present

## 2018-06-20 DIAGNOSIS — L570 Actinic keratosis: Secondary | ICD-10-CM | POA: Diagnosis not present

## 2018-07-01 DIAGNOSIS — H52203 Unspecified astigmatism, bilateral: Secondary | ICD-10-CM | POA: Diagnosis not present

## 2018-07-01 DIAGNOSIS — H2513 Age-related nuclear cataract, bilateral: Secondary | ICD-10-CM | POA: Diagnosis not present

## 2018-07-01 DIAGNOSIS — E119 Type 2 diabetes mellitus without complications: Secondary | ICD-10-CM | POA: Diagnosis not present

## 2018-07-01 DIAGNOSIS — H5213 Myopia, bilateral: Secondary | ICD-10-CM | POA: Diagnosis not present

## 2018-07-01 DIAGNOSIS — H524 Presbyopia: Secondary | ICD-10-CM | POA: Diagnosis not present

## 2018-08-31 DIAGNOSIS — D510 Vitamin B12 deficiency anemia due to intrinsic factor deficiency: Secondary | ICD-10-CM | POA: Diagnosis not present

## 2018-08-31 DIAGNOSIS — E1140 Type 2 diabetes mellitus with diabetic neuropathy, unspecified: Secondary | ICD-10-CM | POA: Diagnosis not present

## 2018-08-31 DIAGNOSIS — E7849 Other hyperlipidemia: Secondary | ICD-10-CM | POA: Diagnosis not present

## 2018-08-31 DIAGNOSIS — Z6830 Body mass index (BMI) 30.0-30.9, adult: Secondary | ICD-10-CM | POA: Diagnosis not present

## 2018-08-31 DIAGNOSIS — I10 Essential (primary) hypertension: Secondary | ICD-10-CM | POA: Diagnosis not present

## 2018-09-02 DIAGNOSIS — E7849 Other hyperlipidemia: Secondary | ICD-10-CM | POA: Diagnosis not present

## 2018-09-02 DIAGNOSIS — Z125 Encounter for screening for malignant neoplasm of prostate: Secondary | ICD-10-CM | POA: Diagnosis not present

## 2018-09-02 DIAGNOSIS — E6609 Other obesity due to excess calories: Secondary | ICD-10-CM | POA: Diagnosis not present

## 2018-09-02 DIAGNOSIS — D510 Vitamin B12 deficiency anemia due to intrinsic factor deficiency: Secondary | ICD-10-CM | POA: Diagnosis not present

## 2018-09-02 DIAGNOSIS — Z6830 Body mass index (BMI) 30.0-30.9, adult: Secondary | ICD-10-CM | POA: Diagnosis not present

## 2018-09-02 DIAGNOSIS — E1140 Type 2 diabetes mellitus with diabetic neuropathy, unspecified: Secondary | ICD-10-CM | POA: Diagnosis not present

## 2018-09-02 DIAGNOSIS — E559 Vitamin D deficiency, unspecified: Secondary | ICD-10-CM | POA: Diagnosis not present

## 2018-11-03 DIAGNOSIS — Z23 Encounter for immunization: Secondary | ICD-10-CM | POA: Diagnosis not present

## 2018-11-29 DIAGNOSIS — Z6831 Body mass index (BMI) 31.0-31.9, adult: Secondary | ICD-10-CM | POA: Diagnosis not present

## 2018-11-29 DIAGNOSIS — E119 Type 2 diabetes mellitus without complications: Secondary | ICD-10-CM | POA: Diagnosis not present

## 2018-11-29 DIAGNOSIS — E6609 Other obesity due to excess calories: Secondary | ICD-10-CM | POA: Diagnosis not present

## 2018-11-29 DIAGNOSIS — Z1389 Encounter for screening for other disorder: Secondary | ICD-10-CM | POA: Diagnosis not present

## 2018-11-29 DIAGNOSIS — Z0001 Encounter for general adult medical examination with abnormal findings: Secondary | ICD-10-CM | POA: Diagnosis not present

## 2018-12-02 ENCOUNTER — Other Ambulatory Visit: Payer: Self-pay

## 2018-12-02 ENCOUNTER — Emergency Department (HOSPITAL_COMMUNITY)
Admission: EM | Admit: 2018-12-02 | Discharge: 2018-12-02 | Disposition: A | Discharge status: Home / Self Care | Payer: Medicare Other | Attending: Emergency Medicine | Admitting: Emergency Medicine

## 2018-12-02 ENCOUNTER — Emergency Department (HOSPITAL_COMMUNITY): Payer: Medicare Other

## 2018-12-02 ENCOUNTER — Encounter (HOSPITAL_COMMUNITY): Payer: Self-pay | Admitting: Emergency Medicine

## 2018-12-02 DIAGNOSIS — I10 Essential (primary) hypertension: Secondary | ICD-10-CM | POA: Insufficient documentation

## 2018-12-02 DIAGNOSIS — Z87891 Personal history of nicotine dependence: Secondary | ICD-10-CM | POA: Diagnosis not present

## 2018-12-02 DIAGNOSIS — Z7984 Long term (current) use of oral hypoglycemic drugs: Secondary | ICD-10-CM | POA: Insufficient documentation

## 2018-12-02 DIAGNOSIS — Z79899 Other long term (current) drug therapy: Secondary | ICD-10-CM | POA: Insufficient documentation

## 2018-12-02 DIAGNOSIS — E119 Type 2 diabetes mellitus without complications: Secondary | ICD-10-CM | POA: Insufficient documentation

## 2018-12-02 DIAGNOSIS — N200 Calculus of kidney: Secondary | ICD-10-CM | POA: Diagnosis not present

## 2018-12-02 DIAGNOSIS — K625 Hemorrhage of anus and rectum: Secondary | ICD-10-CM

## 2018-12-02 DIAGNOSIS — K5732 Diverticulitis of large intestine without perforation or abscess without bleeding: Secondary | ICD-10-CM | POA: Diagnosis not present

## 2018-12-02 LAB — URINALYSIS, ROUTINE W REFLEX MICROSCOPIC
Bacteria, UA: NONE SEEN
Bilirubin Urine: NEGATIVE
Glucose, UA: NEGATIVE mg/dL
Ketones, ur: 5 mg/dL — AB
Nitrite: NEGATIVE
Protein, ur: NEGATIVE mg/dL
RBC / HPF: >50 RBC/hpf — ABNORMAL HIGH (ref 0–5)
Specific Gravity, Urine: 1.014 (ref 1.005–1.030)
pH: 5 (ref 5.0–8.0)

## 2018-12-02 LAB — TYPE AND SCREEN
ABO/RH(D): O POS
Antibody Screen: NEGATIVE

## 2018-12-02 LAB — COMPREHENSIVE METABOLIC PANEL
ALT: 26 U/L (ref 0–44)
AST: 21 U/L (ref 15–41)
Albumin: 4.5 g/dL (ref 3.5–5.0)
Alkaline Phosphatase: 49 U/L (ref 38–126)
Anion gap: 11 (ref 5–15)
BUN: 32 mg/dL — ABNORMAL HIGH (ref 8–23)
CO2: 25 mmol/L (ref 22–32)
Calcium: 10.5 mg/dL — ABNORMAL HIGH (ref 8.9–10.3)
Chloride: 104 mmol/L (ref 98–111)
Creatinine, Ser: 1.57 mg/dL — ABNORMAL HIGH (ref 0.61–1.24)
GFR calc Af Amer: 51 mL/min — ABNORMAL LOW (ref >60)
GFR calc non Af Amer: 44 mL/min — ABNORMAL LOW (ref >60)
Glucose, Bld: 178 mg/dL — ABNORMAL HIGH (ref 70–99)
Potassium: 4.6 mmol/L (ref 3.5–5.1)
Sodium: 140 mmol/L (ref 135–145)
Total Bilirubin: 1.2 mg/dL (ref 0.3–1.2)
Total Protein: 7.2 g/dL (ref 6.5–8.1)

## 2018-12-02 LAB — CBC
HCT: 40.8 % (ref 39.0–52.0)
Hemoglobin: 13 g/dL (ref 13.0–17.0)
MCH: 31.9 pg (ref 26.0–34.0)
MCHC: 31.9 g/dL (ref 30.0–36.0)
MCV: 100 fL (ref 80.0–100.0)
Platelets: 238 10*3/uL (ref 150–400)
RBC: 4.08 MIL/uL — ABNORMAL LOW (ref 4.22–5.81)
RDW: 13.1 % (ref 11.5–15.5)
WBC: 14 10*3/uL — ABNORMAL HIGH (ref 4.0–10.5)
nRBC: 0 % (ref 0.0–0.2)

## 2018-12-02 LAB — PROTIME-INR
INR: 1.1 (ref 0.8–1.2)
Prothrombin Time: 13.6 seconds (ref 11.4–15.2)

## 2018-12-02 LAB — LIPASE, BLOOD: Lipase: 34 U/L (ref 11–51)

## 2018-12-02 MED ORDER — IOHEXOL 300 MG/ML  SOLN
75.0000 mL | Freq: Once | INTRAMUSCULAR | Status: AC | PRN
  Administered 2018-12-02: 75 mL via INTRAVENOUS

## 2018-12-02 MED ORDER — AMOXICILLIN-POT CLAVULANATE 875-125 MG PO TABS: 1.0000 | ORAL_TABLET | Freq: Two times a day (BID) | ORAL | 0 refills | Status: DC

## 2018-12-02 MED ORDER — IOHEXOL 9 MG/ML PO SOLN
ORAL | Status: AC
  Filled 2018-12-02: qty 1000

## 2018-12-02 MED ORDER — SODIUM CHLORIDE 0.9 % IV BOLUS
1000.0000 mL | Freq: Once | INTRAVENOUS | Status: AC
  Administered 2018-12-02: 1000 mL via INTRAVENOUS

## 2018-12-02 MED ORDER — IOHEXOL 9 MG/ML PO SOLN: 500.0000 mL | Freq: Once | ORAL | Status: DC | PRN

## 2018-12-02 NOTE — ED Provider Notes (Signed)
[facility] EMERGENCY DEPARTMENT Provider Note   CSN: [id number] Arrival date & time: 12/02/18  [id number]     History   Chief Complaint Chief Complaint  Patient presents with   Rectal Bleeding    HPI [name] is a 73 y.o. male.     HPI   David Pruitt is a 73 y.o. male with a PMD of CVA, DM, GERD, HTN and previous GI bleed, who presents to the Emergency Department complaining of bright red blood from his rectum that began last evening.  He reports having urge to defecate and noticed a large amount of bright red blood in the toilet.  He states he has had multiple episodes since onset, but last BM was earlier today and he noticed the amount of blood is now minimal.  He also complains of a mild lower abdominal tenderness and nausea.  He states that he had a similar incident in 2011 in which he was found to have a diverticular tear and the area was clamped.  No fever or vomiting.  He does take Plavix and meloxicam daily.  He denies chest pain, dizziness, weakness, syncope, and shortness of breath.      Past Medical History:  Diagnosis Date   CVA (cerebral infarction)    Diabetes mellitus without complication (HCC)    DM (dermatomyositis)    Gastritis    GERD (gastroesophageal reflux disease)    Hemorrhoids    Hypercholesteremia    Hypertension    Lower GI bleed    SECONDARY TO DIVERTICULA,S/P BLEED   Shortness of breath dyspnea     Patient Active Problem List   Diagnosis Date Noted   GERD 01/16/2010   GASTROINTESTINAL HEMORRHAGE 01/16/2010    Past Surgical History:  Procedure Laterality Date   COLONOSCOPY  12/11   COLONOSCOPY N/A 05/24/2014   Procedure: COLONOSCOPY;  Surgeon: Rogene Houston, MD;  Location: [facility];  Service: Endoscopy;  Laterality: N/A;  1030   ESOPHAGOGASTRODUODENOSCOPY  12/11        Home Medications    Prior to Admission medications   Medication Sig Start Date End Date Taking? Authorizing Provider    acetaminophen (TYLENOL) 500 MG tablet Take 1,000 mg every 6 (six) hours as needed by mouth.    [provider]  clindamycin (CLEOCIN) 300 MG capsule Take 300 mg 4 (four) times daily by mouth.    [provider]  clopidogrel (PLAVIX) 75 MG tablet Take 75 mg by mouth daily.    [provider]  ezetimibe-simvastatin (VYTORIN) 10-20 MG per tablet Take 1 tablet by mouth daily.    [provider]  glipiZIDE (GLUCOTROL XL) 5 MG 24 hr tablet Take 5 mg 2 (two) times daily by mouth.    [provider]  lisinopril (PRINIVIL,ZESTRIL) 10 MG tablet Take 10 mg by mouth daily.    [provider]  loratadine (CLARITIN) 10 MG tablet Take 10 mg daily by mouth.    [provider]  meloxicam (MOBIC) 15 MG tablet Take 15 mg by mouth daily.    [provider]  Multiple Vitamins-Minerals (MULTIVITAMIN WITH MINERALS) tablet Take 1 tablet by mouth daily.    [provider]  sitaGLIPtin-metformin (JANUMET) 50-1000 MG tablet Take 1 tablet 2 (two) times daily with a meal by mouth.    [provider]  vitamin B-12 (CYANOCOBALAMIN) 1000 MCG tablet Take 1,000 mcg daily by mouth.    [provider]    Family History Family History  Problem Relation Age of Onset   Colon cancer Father    Stroke Father     Social History Social History   Tobacco Use   Smoking status: Former Smoker    Packs/day: 1.00    Years: 40.00    Pack years: 40.00    Types: Cigarettes   Smokeless tobacco: Never Used  Substance Use Topics   Alcohol use: Yes    Comment: occasionally   Drug use: No     Allergies   Patient has no known allergies.   Review of Systems Review of Systems  Constitutional: Negative for appetite change, chills, fatigue and fever.  Respiratory: Negative for shortness of breath.   Cardiovascular: Negative for chest pain.  Gastrointestinal: Positive for abdominal pain, blood in stool and nausea. Negative for  abdominal distention, rectal pain and vomiting.  Genitourinary: Negative for decreased urine volume, dysuria and flank pain.  Musculoskeletal: Negative for back pain and myalgias.  Skin: Negative for color change and rash.  Neurological: Negative for dizziness, syncope, weakness, numbness and headaches.  Hematological: Negative for adenopathy.     Physical Exam Updated Vital Signs BP (!) 141/96    Pulse (!) 114    Temp 98.6 F (37 C) (Oral)    Resp 16    Ht 5\' 10"  (1.778 m)    Wt 95.3 kg    SpO2 100%    BMI 30.13 kg/m   Physical Exam Vitals signs and nursing note reviewed. Exam conducted with a chaperone present.  Constitutional:      Appearance: Normal appearance. He is not ill-appearing or toxic-appearing.  HENT:     Mouth/Throat:     Mouth: Mucous membranes are moist.  Neck:     Musculoskeletal: Normal range of motion.  Cardiovascular:     Rate and Rhythm: Normal rate and regular rhythm.     Pulses: Normal pulses.  Pulmonary:     Effort: Pulmonary effort is normal.     Breath sounds: Normal breath sounds.  Abdominal:     General: There is no distension.     Palpations: Abdomen is soft.     Tenderness: There is abdominal tenderness (mild ttp of the LLQ.  no guarding or  rebound tenderness. ).  Genitourinary:    Rectum: Guaiac result negative. No mass, tenderness or external hemorrhoid. Normal anal tone.     Comments: insufficient stool in the rectal vault.  No palpable rectal masses, no anal fissures or external hemorrhoids Musculoskeletal: Normal range of motion.     Right lower leg: No edema.     Left lower leg: No edema.  Skin:    General: Skin is warm.     Capillary Refill: Capillary refill takes less than 2 seconds.     Findings: No erythema or rash.  Neurological:     General: No focal deficit present.     Mental Status: He is alert.     Sensory: No sensory deficit.     Motor: No weakness.      ED Treatments / Results  Labs (all labs ordered are listed,  but only abnormal results are displayed) Labs Reviewed  COMPREHENSIVE METABOLIC PANEL - Abnormal; Notable for the following components:      Result Value   Glucose, Bld 178 (*)    BUN 32 (*)    Creatinine, Ser 1.57 (*)    Calcium 10.5 (*)    GFR calc non Af Amer 44 (*)    GFR calc Af Amer 51 (*)  All other components within normal limits  CBC - Abnormal; Notable for the following components:   WBC 14.0 (*)    RBC 4.08 (*)    All other components within normal limits  POC OCCULT BLOOD, ED  TYPE AND SCREEN    EKG None  Radiology Ct Abdomen Pelvis W Contrast  Result Date: 12/02/2018 CLINICAL DATA:  Abdominal pain, diverticulitis suspected, rectal bleeding last night EXAM: CT ABDOMEN AND PELVIS WITH CONTRAST TECHNIQUE: Multidetector CT imaging of the abdomen and pelvis was performed using the standard protocol following bolus administration of intravenous contrast. CONTRAST:  67mL OMNIPAQUE IOHEXOL 300 MG/ML  SOLN COMPARISON:  CT chest June 28, 2017, CT abdomen pelvis January 30, 2008 FINDINGS: Lower chest: Partially calcified pleural-based nodules in the left lung base and smaller nodule in the right lung base are unchanged from comparison chest CT. Additional bandlike areas of atelectasis and/or scarring in the lung bases. Normal heart size. No pericardial effusion. Hepatobiliary: Diffuse hepatic hypoattenuation compatible with hepatic steatosis. No focal liver abnormality is seen. No gallstones, gallbladder wall thickening, or biliary dilatation. Pancreas: Fatty replacement of the pancreas. No pancreatic ductal dilatation or surrounding inflammatory changes. Spleen: Normal in size without focal abnormality. Adrenals/Urinary Tract: Normal adrenal glands. Regions of bilateral cortical scarring. There are bilateral fluid attenuation cysts in both kidneys similar in distribution to the comparison exam with several appearing decreased in size. There is moderate right hydroureteronephrosis to  the level of a 3 mm calculus just proximal to the right UVJ (2/73) no other obstructing urolithiasis. No left hydronephrosis. Mild stable bilateral symmetric perinephric stranding, a nonspecific finding though may correlate with either age or decreased renal function. Normal bladder. Stomach/Bowel: Distal esophagus, stomach and duodenal sweep are unremarkable. No small bowel wall thickening or dilatation. No evidence of obstruction. Contrast media passes to the level of the splenic flexure. Appendix is not well visualized. Proximal colon has a normal appearance. Scattered sigmoid colonic diverticula 8 with a focally thickened segment of the distal sigmoid and faint adjacent pericolonic stranding suggestive of acute diverticulitis (axial series 2, image 59). No extraluminal gas. No organized collection or abscess formation. Vascular/Lymphatic: Atherosclerotic plaque within the normal caliber aorta. Few reactive lymph nodes in the abdomen. No pathologically enlarged nodes. Reproductive: The prostate and seminal vesicles are unremarkable. Other: No abdominopelvic free fluid or free gas. No bowel containing hernias. Musculoskeletal: Multilevel degenerative changes are present in the imaged portions of the spine. No acute osseous abnormality or suspicious osseous lesion. IMPRESSION: 1. Acute uncomplicated sigmoid diverticulitis. No evidence of perforation or abscess formation. 2. Moderate right hydroureteronephrosis to the level of a 3 mm calculus just proximal to the right UVJ. 3. Diminishing size of several bilateral renal cysts and bilateral cortical scarring. 4. Hepatic steatosis. 5. Aortic Atherosclerosis (ICD10-I70.0). 6. Stable calcified pleural nodules, may reflect sequela of prior asbestos related exposure or prior infection/inflammation. Electronically Signed   By: Lovena Le M.D.   On: 12/02/2018 19:21    Procedures Procedures (including critical care time)  Medications Ordered in ED Medications - No  data to display   Initial Impression / Assessment and Plan / ED Course  I have reviewed the triage vital signs and the nursing notes.  Pertinent labs & imaging results that were available during my care of the patient were reviewed by me and considered in my medical decision making (see chart for details).        Attempted hemoccult, but no stool in the rectal vault.    Patient  with bright red blood per rectum.  No evidence of external hemorrhoid or anal fissure on exam.  No rectal tenderness.  CT scan shows sigmoid diverticulitis without evidence of perforation or abscess. Incidental finding of ureteral stone, pt w/o flank pain, or dysuria  Patient tachycardic upon arrival, he has received IV fluids, he remains tachycardic but denies any cardiac symptoms and clinical suspicion for PE is low.Marland Kitchen  He is ambulatory in the department and nontoxic-appearing.  Patient seen by Dr. Eulis Foster and discussed findings with him.  Patient appears appropriate for discharge home, agrees to strict return precautions and close follow-up with GI on Monday.   Final Clinical Impressions(s) / ED Diagnoses   Final diagnoses:  Rectal bleeding  Sigmoid diverticulitis  Kidney stone    ED Discharge Orders    None       Kem Parkinson, PA-C 12/04/18 1401    Daleen Bo, MD 12/05/18 [phone]

## 2018-12-02 NOTE — ED Notes (Signed)
Pt ambulated from waiting room to room 18 without any difficultly or sob/dizziness.

## 2018-12-02 NOTE — Discharge Instructions (Addendum)
As discussed, your CT scan shows that you have diverticulitis of your sigmoid colon.  Start the antibiotics and take them as directed until finished.  Call Dr. Olevia Perches office on Monday to arrange a follow-up appointment.  I would recommend stopping your Mobic until you are seen by Dr. Laural Golden.  Return to the ER if you develop fever, vomiting, worsening abdominal pain or your rectal bleeding increases.  The CT scan also shows a kidney stone on the right side that is 3 mm millimeters in size.  You may be able to pass this stone.  You may follow-up with your urologist if you develop worsening pain to your right side or difficulty urinating.

## 2018-12-02 NOTE — ED Triage Notes (Signed)
Delay explained to patient. NAD noted, sitting with wife in waiting area.

## 2018-12-02 NOTE — ED Triage Notes (Signed)
Patient reports rectal bleeding that started last night. States there is a large amount of bleeding. Has history of prior colon tear.

## 2018-12-02 NOTE — ED Notes (Signed)
ED Provider at bedside. 

## 2018-12-02 NOTE — ED Provider Notes (Signed)
  Face-to-face evaluation   History: He presents for evaluation of rectal bleeding, multiple times since yesterday associated with left lower quadrant abdominal pain.  No fever, vomiting, dizziness or weakness.  Physical exam: Heart, calm and comfortable.  He is lucid.  No respiratory distress.  He is ambulating with a normal gait.  Medical screening examination/treatment/procedure(s) were conducted as a shared visit with non-physician practitioner(s) and myself.  I personally evaluated the patient during the encounter    Daleen Bo, MD 12/05/18 [phone]

## 2018-12-02 NOTE — ED Notes (Signed)
Nausea at times but denies at present.  Loose bloody stools that started yesterday and last night, less today per pt. Denies pain at present

## 2018-12-05 ENCOUNTER — Telehealth: Payer: Self-pay

## 2018-12-05 DIAGNOSIS — D649 Anemia, unspecified: Secondary | ICD-10-CM

## 2018-12-05 NOTE — Telephone Encounter (Signed)
Borderline anemia in ED;  Uncomplicated sigmoid diverticulitis on CT;  WBC 14K;  So, lets get him in to see app tomorrow or Wednesday - Need repeat CBC early tomorrow so its available when he is seen

## 2018-12-05 NOTE — Telephone Encounter (Signed)
Pts wife called- pt was seen in the ED on Friday, CT done and pt was diagnosed with diverticulitis and put on antibiotics. Pt is still having some lower abd pain but their concern is the rectal bleeding he is having.  He is seeing [facility] after each bowel movement for the past 7 days. They stopped his plavix on Wednesday.  They are asking for an ov.   Pt had a tcs done by [facility] in 2011. Pt had another tcs done by NUR in 2016. Pt has not seen any other GI since 2016. They called NUR office to get an appointment and said they were told they couldn't be seen until April. pts wife is concerned and wanted to know if he could be seen here. Please advise.   wifes phone number is [phone].

## 2018-12-05 NOTE — Telephone Encounter (Signed)
pts wife is aware. Lab order done and she will take him first thing in the morning to get it done. appt made with [facility] on Wed 12/07/18 at 8:30am. They are aware of date and time.

## 2018-12-06 DIAGNOSIS — D649 Anemia, unspecified: Secondary | ICD-10-CM | POA: Diagnosis not present

## 2018-12-06 DIAGNOSIS — E1140 Type 2 diabetes mellitus with diabetic neuropathy, unspecified: Secondary | ICD-10-CM | POA: Diagnosis not present

## 2018-12-06 DIAGNOSIS — D510 Vitamin B12 deficiency anemia due to intrinsic factor deficiency: Secondary | ICD-10-CM | POA: Diagnosis not present

## 2018-12-06 DIAGNOSIS — L0889 Other specified local infections of the skin and subcutaneous tissue: Secondary | ICD-10-CM | POA: Diagnosis not present

## 2018-12-06 DIAGNOSIS — K922 Gastrointestinal hemorrhage, unspecified: Secondary | ICD-10-CM | POA: Diagnosis not present

## 2018-12-06 DIAGNOSIS — E782 Mixed hyperlipidemia: Secondary | ICD-10-CM | POA: Diagnosis not present

## 2018-12-06 DIAGNOSIS — K529 Noninfective gastroenteritis and colitis, unspecified: Secondary | ICD-10-CM | POA: Diagnosis not present

## 2018-12-06 DIAGNOSIS — E538 Deficiency of other specified B group vitamins: Secondary | ICD-10-CM | POA: Diagnosis not present

## 2018-12-06 DIAGNOSIS — I10 Essential (primary) hypertension: Secondary | ICD-10-CM | POA: Diagnosis not present

## 2018-12-06 DIAGNOSIS — K5732 Diverticulitis of large intestine without perforation or abscess without bleeding: Secondary | ICD-10-CM | POA: Diagnosis not present

## 2018-12-06 DIAGNOSIS — K219 Gastro-esophageal reflux disease without esophagitis: Secondary | ICD-10-CM | POA: Diagnosis not present

## 2018-12-06 LAB — CBC WITH DIFFERENTIAL/PLATELET
Absolute Monocytes: 959 cells/uL — ABNORMAL HIGH (ref 200–950)
Basophils Absolute: 94 cells/uL (ref 0–200)
Basophils Relative: 1 %
Eosinophils Absolute: 385 cells/uL (ref 15–500)
Eosinophils Relative: 4.1 %
HCT: 30.5 % — ABNORMAL LOW (ref 38.5–50.0)
Hemoglobin: 9.9 g/dL — ABNORMAL LOW (ref 13.2–17.1)
Lymphs Abs: 1767 cells/uL (ref 850–3900)
MCH: 31.7 pg (ref 27.0–33.0)
MCHC: 32.5 g/dL (ref 32.0–36.0)
MCV: 97.8 fL (ref 80.0–100.0)
MPV: 9.9 fL (ref 7.5–12.5)
Monocytes Relative: 10.2 %
Neutro Abs: 6195 cells/uL (ref 1500–7800)
Neutrophils Relative %: 65.9 %
Platelets: 222 10*3/uL (ref 140–400)
RBC: 3.12 10*6/uL — ABNORMAL LOW (ref 4.20–5.80)
RDW: 12.9 % (ref 11.0–15.0)
Total Lymphocyte: 18.8 %
WBC: 9.4 10*3/uL (ref 3.8–10.8)

## 2018-12-06 NOTE — Progress Notes (Signed)
Primary Care Physician:  Sharilyn Sites, MD  Primary Gastroenterologist:  Garfield Cornea, MD   Chief Complaint  Patient presents with  . Rectal Bleeding    x thursday. Went to ER friday  . Abdominal Pain    slight lower abd discomfort    HPI:  David Pruitt is a 73 y.o. male here for a semiurgent visit.  He was seen in the emergency department on Friday, CT showed acute uncomplicated sigmoid diverticulitis, moderate right hydroureteronephrosis to the level of a 3 mm calculus just proximal to the right UVJ.  Patient was placed on Augmentin.  He has also had several episodes of large-volume hematochezia which was tapering off.  DRE in the ED with insufficient stool in the rectal vault, no overt bleeding noted.  Patient called in with concerns of ongoing abdominal pain and rectal bleeding yesterday therefore advised to come in for office visit.  Patient stopped his Plavix on Wednesday.  Hemoglobin dropped from 13 on November 20 down to 9.9 yesterday.  We called patient to check on him and he reported that rectal bleeding had tapered significantly.  Patient reports that he had 3 episodes of large volume hematochezia Thursday. Some bleeding Friday after CT. Stopped Plavix on Thursday night.  Over the past several days his bleeding has tapered.  Last bleeding noted yesterday.  Last BM last night, very small amount of stool but no blood. Really hasn't had any significant abdominal pain.  Episode similar to diverticular bleed in 2011.  No nausea or vomiting.  No urinary symptoms.  No upper GI symptoms.  History of diverticular bleed in 2011, colonoscopy by Dr. Gala Romney at that time with therapeutic intervention.  EGD with mild gastritis/duodenitis/esophagitis.  No H. pylori.  His last colonoscopy was in 2016 by Dr. Laural Golden.  Mild sigmoid colon diverticulosis, small external hemorrhoids.  Advised follow-up colonoscopy in 7 years.  Current Outpatient Medications  Medication Sig Dispense Refill  .  acetaminophen (TYLENOL) 500 MG tablet Take 1,000 mg every 6 (six) hours as needed by mouth.    Marland Kitchen amoxicillin-clavulanate (AUGMENTIN) 875-125 MG tablet Take 1 tablet by mouth every 12 (twelve) hours. 20 tablet 0  . ezetimibe-simvastatin (VYTORIN) 10-20 MG per tablet Take 1 tablet by mouth daily.    Marland Kitchen glipiZIDE (GLUCOTROL XL) 5 MG 24 hr tablet Take 5 mg 2 (two) times daily by mouth.    Marland Kitchen lisinopril (PRINIVIL,ZESTRIL) 10 MG tablet Take 10 mg by mouth daily.    Marland Kitchen loratadine (CLARITIN) 10 MG tablet Take 10 mg daily by mouth.    . Multiple Vitamins-Minerals (MULTIVITAMIN WITH MINERALS) tablet Take 1 tablet by mouth daily.    . simvastatin (ZOCOR) 20 MG tablet Take 20 mg by mouth at bedtime.    . sitaGLIPtin-metformin (JANUMET) 50-1000 MG tablet Take 1 tablet 2 (two) times daily with a meal by mouth.    . vitamin B-12 (CYANOCOBALAMIN) 1000 MCG tablet Take 1,000 mcg daily by mouth.    . clopidogrel (PLAVIX) 75 MG tablet Take 75 mg by mouth daily.    . meloxicam (MOBIC) 15 MG tablet Take 15 mg by mouth daily.     No current facility-administered medications for this visit.     Allergies as of 12/07/2018  . (No Known Allergies)    Past Medical History:  Diagnosis Date  . CVA (cerebral infarction)   . Diabetes mellitus ([place])   . Diabetes mellitus without complication ([place])   . DM (dermatomyositis)   . Gastritis   . GERD (  gastroesophageal reflux disease)   . Hemorrhoids   . Hypercholesteremia   . Hypertension   . Lower GI bleed    SECONDARY TO DIVERTICULA,S/P BLEED  . Shortness of breath dyspnea     Past Surgical History:  Procedure Laterality Date  . COLONOSCOPY  12/2009   RMR: Lower GI bleed due to a single diverticulum status post Endo clipping.  Scattered diverticulosis.  . COLONOSCOPY N/A 05/24/2014   Procedure: COLONOSCOPY;  Surgeon: Rogene Houston, MD;  Location: [facility];  Service: Endoscopy;  Laterality: N/A;  1030  . ESOPHAGOGASTRODUODENOSCOPY  12/2009   RMR: Mild  esophagitis, gastritis, duodenitis.  Gastric biopsies negative for H. pylori    Family History  Problem Relation Age of Onset  . Colon cancer Father   . Stroke Father     Social History   Socioeconomic History  . Marital status: Married    Spouse name: Not on file  . Number of children: Not on file  . Years of education: Not on file  . Highest education level: Not on file  Occupational History  . Not on file  Social Needs  . Financial resource strain: Not on file  . Food insecurity    Worry: Not on file    Inability: Not on file  . Transportation needs    Medical: Not on file    Non-medical: Not on file  Tobacco Use  . Smoking status: Former Smoker    Packs/day: 1.00    Years: 40.00    Pack years: 40.00    Types: Cigarettes  . Smokeless tobacco: Never Used  Substance and Sexual Activity  . Alcohol use: Yes    Comment: occasionally  . Drug use: No  . Sexual activity: Not on file  Lifestyle  . Physical activity    Days per week: Not on file    Minutes per session: Not on file  . Stress: Not on file  Relationships  . Social Herbalist on phone: Not on file    Gets together: Not on file    Attends religious service: Not on file    Active member of club or organization: Not on file    Attends meetings of clubs or organizations: Not on file    Relationship status: Not on file  . Intimate partner violence    Fear of current or ex partner: Not on file    Emotionally abused: Not on file    Physically abused: Not on file    Forced sexual activity: Not on file  Other Topics Concern  . Not on file  Social History Narrative  . Not on file      ROS:  General: Negative for anorexia, weight loss, fever, chills, fatigue, positive weakness. Eyes: Negative for vision changes.  ENT: Negative for hoarseness, difficulty swallowing , nasal congestion. CV: Negative for chest pain, angina, palpitations, dyspnea on exertion, peripheral edema.  Respiratory:  Negative for dyspnea at rest, dyspnea on exertion, cough, sputum, wheezing.  GI: See history of present illness. GU:  Negative for dysuria, hematuria, urinary incontinence, urinary frequency, nocturnal urination.  MS: Negative for joint pain, low back pain.  Derm: Negative for rash or itching.  Neuro: Negative for weakness, abnormal sensation, seizure, frequent headaches, memory loss, confusion.  Psych: Negative for anxiety, depression, suicidal ideation, hallucinations.  Endo: Negative for unusual weight change.  Heme: Negative for bruising or bleeding. Allergy: Negative for rash or hives.    Physical Examination:  BP 124/81  Pulse (!) 110   Temp 97.9 F (36.6 C) (Oral)   Ht 5\' 10"  (1.778 m)   Wt 211 lb 9.6 oz (96 kg)   BMI 30.36 kg/m    General: Well-nourished, well-developed in no acute distress.  Head: Normocephalic, atraumatic.   Eyes: Conjunctiva pink, no icterus. Mouth: Oropharyngeal mucosa moist and pink , no lesions erythema or exudate. Neck: Supple without thyromegaly, masses, or lymphadenopathy.  Lungs: Clear to auscultation bilaterally.  Heart: Regular rate and rhythm, no murmurs rubs or gallops.  Abdomen: Bowel sounds are normal, nontender, nondistended, no hepatosplenomegaly or masses, no abdominal bruits or    hernia , no rebound or guarding.   Rectal: Not performed.  Done in the ED. Extremities: No lower extremity edema. No clubbing or deformities.  Neuro: Alert and oriented x 4 , grossly normal neurologically.  Skin: Warm and dry, no rash or jaundice.   Psych: Alert and cooperative, normal mood and affect.  Labs: Lab Results  Component Value Date   CREATININE 1.57 (H) 12/02/2018   BUN 32 (H) 12/02/2018   NA 140 12/02/2018   K 4.6 12/02/2018   CL 104 12/02/2018   CO2 25 12/02/2018   Lab Results  Component Value Date   ALT 26 12/02/2018   AST 21 12/02/2018   ALKPHOS 49 12/02/2018   BILITOT 1.2 12/02/2018   Lab Results  Component Value Date    LIPASE 34 12/02/2018   Lab Results  Component Value Date   INR 1.1 12/02/2018   INR 1.00 12/20/2009   Lab Results  Component Value Date   WBC 9.4 12/06/2018   HGB 9.9 (L) 12/06/2018   HCT 30.5 (L) 12/06/2018   MCV 97.8 12/06/2018   PLT 222 12/06/2018    December 02, 2018: White blood cell count 14,000, hemoglobin 13.  Imaging Studies: Ct Abdomen Pelvis W Contrast  Result Date: 12/02/2018 CLINICAL DATA:  Abdominal pain, diverticulitis suspected, rectal bleeding last night EXAM: CT ABDOMEN AND PELVIS WITH CONTRAST TECHNIQUE: Multidetector CT imaging of the abdomen and pelvis was performed using the standard protocol following bolus administration of intravenous contrast. CONTRAST:  26mL OMNIPAQUE IOHEXOL 300 MG/ML  SOLN COMPARISON:  CT chest June 28, 2017, CT abdomen pelvis January 30, 2008 FINDINGS: Lower chest: Partially calcified pleural-based nodules in the left lung base and smaller nodule in the right lung base are unchanged from comparison chest CT. Additional bandlike areas of atelectasis and/or scarring in the lung bases. Normal heart size. No pericardial effusion. Hepatobiliary: Diffuse hepatic hypoattenuation compatible with hepatic steatosis. No focal liver abnormality is seen. No gallstones, gallbladder wall thickening, or biliary dilatation. Pancreas: Fatty replacement of the pancreas. No pancreatic ductal dilatation or surrounding inflammatory changes. Spleen: Normal in size without focal abnormality. Adrenals/Urinary Tract: Normal adrenal glands. Regions of bilateral cortical scarring. There are bilateral fluid attenuation cysts in both kidneys similar in distribution to the comparison exam with several appearing decreased in size. There is moderate right hydroureteronephrosis to the level of a 3 mm calculus just proximal to the right UVJ (2/73) no other obstructing urolithiasis. No left hydronephrosis. Mild stable bilateral symmetric perinephric stranding, a nonspecific finding  though may correlate with either age or decreased renal function. Normal bladder. Stomach/Bowel: Distal esophagus, stomach and duodenal sweep are unremarkable. No small bowel wall thickening or dilatation. No evidence of obstruction. Contrast media passes to the level of the splenic flexure. Appendix is not well visualized. Proximal colon has a normal appearance. Scattered sigmoid colonic diverticula 8 with a focally  thickened segment of the distal sigmoid and faint adjacent pericolonic stranding suggestive of acute diverticulitis (axial series 2, image 59). No extraluminal gas. No organized collection or abscess formation. Vascular/Lymphatic: Atherosclerotic plaque within the normal caliber aorta. Few reactive lymph nodes in the abdomen. No pathologically enlarged nodes. Reproductive: The prostate and seminal vesicles are unremarkable. Other: No abdominopelvic free fluid or free gas. No bowel containing hernias. Musculoskeletal: Multilevel degenerative changes are present in the imaged portions of the spine. No acute osseous abnormality or suspicious osseous lesion. IMPRESSION: 1. Acute uncomplicated sigmoid diverticulitis. No evidence of perforation or abscess formation. 2. Moderate right hydroureteronephrosis to the level of a 3 mm calculus just proximal to the right UVJ. 3. Diminishing size of several bilateral renal cysts and bilateral cortical scarring. 4. Hepatic steatosis. 5. Aortic Atherosclerosis (ICD10-I70.0). 6. Stable calcified pleural nodules, may reflect sequela of prior asbestos related exposure or prior infection/inflammation. Electronically Signed   By: Lovena Le M.D.   On: 12/02/2018 19:21

## 2018-12-07 ENCOUNTER — Telehealth: Payer: Self-pay | Admitting: Gastroenterology

## 2018-12-07 ENCOUNTER — Other Ambulatory Visit: Payer: Self-pay

## 2018-12-07 ENCOUNTER — Ambulatory Visit (INDEPENDENT_AMBULATORY_CARE_PROVIDER_SITE_OTHER): Payer: Medicare Other | Admitting: Gastroenterology

## 2018-12-07 ENCOUNTER — Encounter: Payer: Self-pay | Admitting: Gastroenterology

## 2018-12-07 VITALS — BP 124/81 | HR 110 | Temp 97.9°F | Ht 70.0 in | Wt 211.6 lb

## 2018-12-07 DIAGNOSIS — K922 Gastrointestinal hemorrhage, unspecified: Secondary | ICD-10-CM | POA: Diagnosis not present

## 2018-12-07 DIAGNOSIS — R933 Abnormal findings on diagnostic imaging of other parts of digestive tract: Secondary | ICD-10-CM

## 2018-12-07 DIAGNOSIS — E538 Deficiency of other specified B group vitamins: Secondary | ICD-10-CM | POA: Diagnosis not present

## 2018-12-07 DIAGNOSIS — D62 Acute posthemorrhagic anemia: Secondary | ICD-10-CM | POA: Insufficient documentation

## 2018-12-07 DIAGNOSIS — Z8719 Personal history of other diseases of the digestive system: Secondary | ICD-10-CM | POA: Insufficient documentation

## 2018-12-07 NOTE — Patient Instructions (Signed)
1. Please go for labs in four weeks.  2. Increase dietary iron. You can also take ferrous sulfate 325mg  twice per day. 3. I will reach out to you later today with further instructions regarding your plavix and possible colonoscopy.  4. Complete Augmentin. 5. Contact your urologist regarding kidney stone. See CT report.  6. Please go to ER if you have recurrent significant bleeding, you feel lightheaded, dizzy, or short of breath.    Iron-Rich Diet  Iron is a mineral that helps your body to produce hemoglobin. Hemoglobin is a protein in red blood cells that carries oxygen to your body's tissues. Eating too little iron may cause you to feel weak and tired, and it can increase your risk of infection. Iron is naturally found in many foods, and many foods have iron added to them (iron-fortified foods). You may need to follow an iron-rich diet if you do not have enough iron in your body due to certain medical conditions. The amount of iron that you need each day depends on your age, your sex, and any medical conditions you have. Follow instructions from your health care provider or a diet and nutrition specialist (dietitian) about how much iron you should eat each day. What are tips for following this plan? Reading food labels  Check food labels to see how many milligrams (mg) of iron are in each serving. Cooking  Cook foods in pots and pans that are made from iron.  Take these steps to make it easier for your body to absorb iron from certain foods: ? Soak beans overnight before cooking. ? Soak whole grains overnight and drain them before using. ? Ferment flours before baking, such as by using yeast in bread dough. Meal planning  When you eat foods that contain iron, you should eat them with foods that are high in vitamin C. These include oranges, peppers, tomatoes, potatoes, and mango. Vitamin C helps your body to absorb iron. General information  Take iron supplements only as told by your  health care provider. An overdose of iron can be life-threatening. If you were prescribed iron supplements, take them with orange juice or a vitamin C supplement.  When you eat iron-fortified foods or take an iron supplement, you should also eat foods that naturally contain iron, such as meat, poultry, and fish. Eating naturally iron-rich foods helps your body to absorb the iron that is added to other foods or contained in a supplement.  Certain foods and drinks prevent your body from absorbing iron properly. Avoid eating these foods in the same meal as iron-rich foods or with iron supplements. These foods include: ? Coffee, black tea, and red wine. ? Milk, dairy products, and foods that are high in calcium. ? Beans and soybeans. ? Whole grains. What foods should I eat? Fruits Prunes. Raisins. Eat fruits high in vitamin C, such as oranges, grapefruits, and strawberries, alongside iron-rich foods. Vegetables Spinach (cooked). Green peas. Broccoli. Fermented vegetables. Eat vegetables high in vitamin C, such as leafy greens, potatoes, bell peppers, and tomatoes, alongside iron-rich foods. Grains Iron-fortified breakfast cereal. Iron-fortified whole-wheat bread. Enriched rice. Sprouted grains. Meats and other proteins Beef liver. Oysters. Beef. Shrimp. [place]. Chicken. [place]. Sardines. Chickpeas. Nuts. Tofu. Pumpkin seeds. Beverages Tomato juice. Fresh orange juice. Prune juice. Hibiscus tea. Fortified instant breakfast shakes. Sweets and desserts Blackstrap molasses. Seasonings and condiments Tahini. Fermented soy sauce. Other foods Wheat germ. The items listed above may not be a complete list of recommended foods and beverages. Contact a  dietitian for more information. What foods should I avoid? Grains Whole grains. Bran cereal. Bran flour. Oats. Meats and other proteins Soybeans. Products made from soy protein. Black beans. Lentils. Mung beans. Split peas. Dairy Milk. Cream. Cheese.  Yogurt. Cottage cheese. Beverages Coffee. Black tea. Red wine. Sweets and desserts Cocoa. Chocolate. Ice cream. Other foods Basil. Oregano. Large amounts of parsley. The items listed above may not be a complete list of foods and beverages to avoid. Contact a dietitian for more information. Summary  Iron is a mineral that helps your body to produce hemoglobin. Hemoglobin is a protein in red blood cells that carries oxygen to your body's tissues.  Iron is naturally found in many foods, and many foods have iron added to them (iron-fortified foods).  When you eat foods that contain iron, you should eat them with foods that are high in vitamin C. Vitamin C helps your body to absorb iron.  Certain foods and drinks prevent your body from absorbing iron properly, such as whole grains and dairy products. You should avoid eating these foods in the same meal as iron-rich foods or with iron supplements. This information is not intended to replace advice given to you by your health care provider. Make sure you discuss any questions you have with your health care provider. Document Released: 08/12/2004 Document Revised: 12/11/2016 Document Reviewed: 11/24/2016 Elsevier Patient Education  2020 [employer].

## 2018-12-07 NOTE — Assessment & Plan Note (Addendum)
Very pleasant 73 year old gentleman who presented to the emergency department on November 20 with 1 day history of large-volume hematochezia.  He had several episodes on November 19 prior to presentation.  Had at least one episode while in the emergency department on November 20.  At that time his hemoglobin was in the low 13 range.  CT suggested acute uncomplicated sigmoid diverticulitis.  He denies any abdominal pain.  He is on Augmentin prescribed by the ED.  He stopped his Plavix on his own when the bleeding started.  I suspect he had diverticular bleed.  Unusual to have both diverticulitis and bleeding at the same time.  He will likely need colonoscopy in several weeks to evaluate abnormal sigmoid colon to rule out underlying malignancy.  Repeat CBC yesterday with drop in hemoglobin to 9.9.  He will continue to monitor for further bleeding, lightheadedness, weakness, shortness of breath.  If any of these develop, he may require going back to the ED for intervention.  I will discuss further with Dr. Gala Romney regarding resuming Plavix and let patient know later today.  Increase iron rich food.  Add ferrous sulfate 325 mg twice daily.  Recheck labs in 4 weeks.  Patient was noted to have moderate right hydroureteronephrosis to the level of a 3 mm calculus just proximal to the right UVJ.  Slight bump in creatinine.  Denies urinary symptoms.  Request that he follow-up with his urologist for recommendations.

## 2018-12-07 NOTE — Assessment & Plan Note (Signed)
check B12 level at time of CBC in 4 weeks.

## 2018-12-07 NOTE — Telephone Encounter (Signed)
Spoke with patient's wife, Adela Lank. She is aware that RMR advised to restart Plavix on Friday.  Please schedule colonoscopy with RMR for abnormal sigmoid colon on CT. Cannot be before 01/2019 (due to recent diverticulitis).   Day of prep: glipizide 2.5mg  BID. Janumet 1/2 tablet BID AM of TCS: hold glipizide, janumet

## 2018-12-12 ENCOUNTER — Other Ambulatory Visit: Payer: Self-pay | Admitting: *Deleted

## 2018-12-12 DIAGNOSIS — R933 Abnormal findings on diagnostic imaging of other parts of digestive tract: Secondary | ICD-10-CM

## 2018-12-12 NOTE — Telephone Encounter (Signed)
Called pt. He is scheduled for TCS 2/19 at 7:30am. Patient also aware will need covid testing prior to procedure. This has been scheduled for 2/17 at 10:00am. Patient aware of location for testing. Also aware will mail instructions to him. Rx sent to pharmacy. Orders entered.  Patient states he restarted his plavix but can he also restart his meloxicam?

## 2018-12-20 DIAGNOSIS — Z85828 Personal history of other malignant neoplasm of skin: Secondary | ICD-10-CM | POA: Diagnosis not present

## 2018-12-20 DIAGNOSIS — D1801 Hemangioma of skin and subcutaneous tissue: Secondary | ICD-10-CM | POA: Diagnosis not present

## 2018-12-20 DIAGNOSIS — L570 Actinic keratosis: Secondary | ICD-10-CM | POA: Diagnosis not present

## 2018-12-29 NOTE — Telephone Encounter (Signed)
Called made patient aware of recs. He voiced understanding. He will go have labs done as planned

## 2018-12-29 NOTE — Telephone Encounter (Signed)
Would not advise restarting mobic at this time given anemia, h/o gi bleeding.   Patient needs to have his labs done this week or next as planned.

## 2019-01-03 LAB — CBC WITH DIFFERENTIAL/PLATELET
Absolute Monocytes: 1019 cells/uL — ABNORMAL HIGH (ref 200–950)
Basophils Absolute: 59 cells/uL (ref 0–200)
Basophils Relative: 0.6 %
Eosinophils Absolute: 314 cells/uL (ref 15–500)
Eosinophils Relative: 3.2 %
HCT: 37.5 % — ABNORMAL LOW (ref 38.5–50.0)
Hemoglobin: 12.3 g/dL — ABNORMAL LOW (ref 13.2–17.1)
Lymphs Abs: 1362 cells/uL (ref 850–3900)
MCH: 32 pg (ref 27.0–33.0)
MCHC: 32.8 g/dL (ref 32.0–36.0)
MCV: 97.7 fL (ref 80.0–100.0)
MPV: 10.1 fL (ref 7.5–12.5)
Monocytes Relative: 10.4 %
Neutro Abs: 7046 cells/uL (ref 1500–7800)
Neutrophils Relative %: 71.9 %
Platelets: 241 10*3/uL (ref 140–400)
RBC: 3.84 10*6/uL — ABNORMAL LOW (ref 4.20–5.80)
RDW: 13.1 % (ref 11.0–15.0)
Total Lymphocyte: 13.9 %
WBC: 9.8 10*3/uL (ref 3.8–10.8)

## 2019-01-03 LAB — VITAMIN B12: Vitamin B-12: >2000 pg/mL — ABNORMAL HIGH (ref 200–1100)

## 2019-01-03 LAB — FERRITIN: Ferritin: 9 ng/mL — ABNORMAL LOW (ref 24–380)

## 2019-01-09 ENCOUNTER — Other Ambulatory Visit: Payer: Self-pay

## 2019-01-09 DIAGNOSIS — K922 Gastrointestinal hemorrhage, unspecified: Secondary | ICD-10-CM

## 2019-01-10 ENCOUNTER — Other Ambulatory Visit: Payer: Self-pay

## 2019-01-10 DIAGNOSIS — K922 Gastrointestinal hemorrhage, unspecified: Secondary | ICD-10-CM

## 2019-01-17 ENCOUNTER — Other Ambulatory Visit (HOSPITAL_COMMUNITY): Payer: Self-pay | Admitting: Family Medicine

## 2019-01-17 ENCOUNTER — Other Ambulatory Visit: Payer: Self-pay | Admitting: Family Medicine

## 2019-01-17 DIAGNOSIS — Z1389 Encounter for screening for other disorder: Secondary | ICD-10-CM

## 2019-01-17 DIAGNOSIS — Z122 Encounter for screening for malignant neoplasm of respiratory organs: Secondary | ICD-10-CM

## 2019-01-17 DIAGNOSIS — F17210 Nicotine dependence, cigarettes, uncomplicated: Secondary | ICD-10-CM

## 2019-02-01 ENCOUNTER — Other Ambulatory Visit: Payer: Self-pay

## 2019-02-01 ENCOUNTER — Ambulatory Visit (HOSPITAL_COMMUNITY)
Admission: RE | Admit: 2019-02-01 | Discharge: 2019-02-01 | Disposition: A | Discharge status: Home / Self Care | Payer: Medicare Other | Source: Ambulatory Visit | Attending: Family Medicine | Admitting: Family Medicine

## 2019-02-01 DIAGNOSIS — Z122 Encounter for screening for malignant neoplasm of respiratory organs: Secondary | ICD-10-CM | POA: Diagnosis not present

## 2019-02-01 DIAGNOSIS — F17210 Nicotine dependence, cigarettes, uncomplicated: Secondary | ICD-10-CM | POA: Diagnosis not present

## 2019-02-01 DIAGNOSIS — Z87891 Personal history of nicotine dependence: Secondary | ICD-10-CM | POA: Diagnosis not present

## 2019-02-01 DIAGNOSIS — Z1389 Encounter for screening for other disorder: Secondary | ICD-10-CM | POA: Diagnosis not present

## 2019-02-09 DIAGNOSIS — K922 Gastrointestinal hemorrhage, unspecified: Secondary | ICD-10-CM | POA: Diagnosis not present

## 2019-02-09 LAB — CBC WITH DIFFERENTIAL/PLATELET
Absolute Monocytes: 1025 cells/uL — ABNORMAL HIGH (ref 200–950)
Basophils Absolute: 103 cells/uL (ref 0–200)
Basophils Relative: 1.1 %
Eosinophils Absolute: 489 cells/uL (ref 15–500)
Eosinophils Relative: 5.2 %
HCT: 41.3 % (ref 38.5–50.0)
Hemoglobin: 13.7 g/dL (ref 13.2–17.1)
Lymphs Abs: 2002 cells/uL (ref 850–3900)
MCH: 31.4 pg (ref 27.0–33.0)
MCHC: 33.2 g/dL (ref 32.0–36.0)
MCV: 94.7 fL (ref 80.0–100.0)
MPV: 10.4 fL (ref 7.5–12.5)
Monocytes Relative: 10.9 %
Neutro Abs: 5781 cells/uL (ref 1500–7800)
Neutrophils Relative %: 61.5 %
Platelets: 197 10*3/uL (ref 140–400)
RBC: 4.36 10*6/uL (ref 4.20–5.80)
RDW: 13.2 % (ref 11.0–15.0)
Total Lymphocyte: 21.3 %
WBC: 9.4 10*3/uL (ref 3.8–10.8)

## 2019-02-16 ENCOUNTER — Telehealth: Payer: Self-pay

## 2019-02-16 ENCOUNTER — Other Ambulatory Visit: Payer: Self-pay

## 2019-02-16 DIAGNOSIS — K922 Gastrointestinal hemorrhage, unspecified: Secondary | ICD-10-CM

## 2019-02-16 DIAGNOSIS — D72821 Monocytosis (symptomatic): Secondary | ICD-10-CM

## 2019-02-16 MED ORDER — SUPREP BOWEL PREP KIT 17.5-3.13-1.6 GM/177ML PO SOLN: 1.0000 | ORAL | 0 refills | Status: DC

## 2019-02-16 NOTE — Telephone Encounter (Signed)
Pt's wife called, rx for TCS prep sent to pharmacy.

## 2019-02-20 DIAGNOSIS — N480 Leukoplakia of penis: Secondary | ICD-10-CM | POA: Diagnosis not present

## 2019-02-20 DIAGNOSIS — Z8042 Family history of malignant neoplasm of prostate: Secondary | ICD-10-CM | POA: Diagnosis not present

## 2019-03-01 ENCOUNTER — Other Ambulatory Visit (HOSPITAL_COMMUNITY)
Admission: RE | Admit: 2019-03-01 | Discharge: 2019-03-01 | Disposition: A | Discharge status: Home / Self Care | Payer: Medicare Other | Source: Ambulatory Visit | Attending: Internal Medicine | Admitting: Internal Medicine

## 2019-03-01 ENCOUNTER — Other Ambulatory Visit: Payer: Self-pay

## 2019-03-01 DIAGNOSIS — Z20822 Contact with and (suspected) exposure to covid-19: Secondary | ICD-10-CM | POA: Insufficient documentation

## 2019-03-01 DIAGNOSIS — Z01812 Encounter for preprocedural laboratory examination: Secondary | ICD-10-CM | POA: Diagnosis not present

## 2019-03-01 LAB — SARS CORONAVIRUS 2 (TAT 6-24 HRS): SARS Coronavirus 2: NEGATIVE

## 2019-03-06 ENCOUNTER — Telehealth: Payer: Self-pay

## 2019-03-06 ENCOUNTER — Other Ambulatory Visit: Payer: Self-pay

## 2019-03-06 MED ORDER — SUPREP BOWEL PREP KIT 17.5-3.13-1.6 GM/177ML PO SOLN: 1.0000 | ORAL | 0 refills | Status: DC

## 2019-03-06 NOTE — Telephone Encounter (Signed)
Called pt, TCS w/RMR rescheduled to 03/15/19 at 7:30am. COVID test 03/13/19 at 1:00pm. New instructions given on phone since he had previous instructions. New instructions also mailed and he has MyChart. Rx sent to pharmacy since he had mixed one bottle. Endo scheduler informed

## 2019-03-06 NOTE — Telephone Encounter (Signed)
Pt missed his TCS due to the weather last week. Pt lmom 2.19.2020. please call to r/s

## 2019-03-09 DIAGNOSIS — N480 Leukoplakia of penis: Secondary | ICD-10-CM | POA: Diagnosis not present

## 2019-03-09 DIAGNOSIS — Z87448 Personal history of other diseases of urinary system: Secondary | ICD-10-CM | POA: Diagnosis not present

## 2019-03-09 DIAGNOSIS — Z7984 Long term (current) use of oral hypoglycemic drugs: Secondary | ICD-10-CM | POA: Diagnosis not present

## 2019-03-09 DIAGNOSIS — R3129 Other microscopic hematuria: Secondary | ICD-10-CM | POA: Diagnosis not present

## 2019-03-09 DIAGNOSIS — E119 Type 2 diabetes mellitus without complications: Secondary | ICD-10-CM | POA: Diagnosis not present

## 2019-03-09 DIAGNOSIS — Z87442 Personal history of urinary calculi: Secondary | ICD-10-CM | POA: Diagnosis not present

## 2019-03-09 DIAGNOSIS — R7989 Other specified abnormal findings of blood chemistry: Secondary | ICD-10-CM | POA: Diagnosis not present

## 2019-03-09 DIAGNOSIS — N4883 Acquired buried penis: Secondary | ICD-10-CM | POA: Diagnosis not present

## 2019-03-09 DIAGNOSIS — N132 Hydronephrosis with renal and ureteral calculous obstruction: Secondary | ICD-10-CM | POA: Diagnosis not present

## 2019-03-13 ENCOUNTER — Other Ambulatory Visit (HOSPITAL_COMMUNITY)
Admission: RE | Admit: 2019-03-13 | Discharge: 2019-03-13 | Disposition: A | Discharge status: Home / Self Care | Payer: Medicare Other | Source: Ambulatory Visit | Attending: Internal Medicine | Admitting: Internal Medicine

## 2019-03-13 ENCOUNTER — Other Ambulatory Visit: Payer: Self-pay

## 2019-03-13 DIAGNOSIS — Z01812 Encounter for preprocedural laboratory examination: Secondary | ICD-10-CM | POA: Diagnosis not present

## 2019-03-13 DIAGNOSIS — Z20822 Contact with and (suspected) exposure to covid-19: Secondary | ICD-10-CM | POA: Insufficient documentation

## 2019-03-13 LAB — SARS CORONAVIRUS 2 (TAT 6-24 HRS): SARS Coronavirus 2: NEGATIVE

## 2019-03-14 DIAGNOSIS — Z87442 Personal history of urinary calculi: Secondary | ICD-10-CM | POA: Diagnosis not present

## 2019-03-14 DIAGNOSIS — N132 Hydronephrosis with renal and ureteral calculous obstruction: Secondary | ICD-10-CM | POA: Diagnosis not present

## 2019-03-15 ENCOUNTER — Encounter: Payer: Self-pay | Admitting: Internal Medicine

## 2019-03-15 ENCOUNTER — Encounter (HOSPITAL_COMMUNITY)
Admission: RE | Disposition: A | Discharge status: Home / Self Care | Payer: Self-pay | Source: Home / Self Care | Attending: Internal Medicine

## 2019-03-15 ENCOUNTER — Encounter (HOSPITAL_COMMUNITY): Payer: Self-pay | Admitting: Internal Medicine

## 2019-03-15 ENCOUNTER — Other Ambulatory Visit: Payer: Self-pay

## 2019-03-15 ENCOUNTER — Ambulatory Visit (HOSPITAL_COMMUNITY)
Admission: RE | Admit: 2019-03-15 | Discharge: 2019-03-15 | Disposition: A | Discharge status: Home / Self Care | Payer: Medicare Other | Attending: Internal Medicine | Admitting: Internal Medicine

## 2019-03-15 DIAGNOSIS — Z791 Long term (current) use of non-steroidal anti-inflammatories (NSAID): Secondary | ICD-10-CM | POA: Insufficient documentation

## 2019-03-15 DIAGNOSIS — E7800 Pure hypercholesterolemia, unspecified: Secondary | ICD-10-CM | POA: Insufficient documentation

## 2019-03-15 DIAGNOSIS — Z79899 Other long term (current) drug therapy: Secondary | ICD-10-CM | POA: Diagnosis not present

## 2019-03-15 DIAGNOSIS — Z7984 Long term (current) use of oral hypoglycemic drugs: Secondary | ICD-10-CM | POA: Diagnosis not present

## 2019-03-15 DIAGNOSIS — I10 Essential (primary) hypertension: Secondary | ICD-10-CM | POA: Insufficient documentation

## 2019-03-15 DIAGNOSIS — K5730 Diverticulosis of large intestine without perforation or abscess without bleeding: Secondary | ICD-10-CM | POA: Diagnosis not present

## 2019-03-15 DIAGNOSIS — D122 Benign neoplasm of ascending colon: Secondary | ICD-10-CM | POA: Diagnosis not present

## 2019-03-15 DIAGNOSIS — K640 First degree hemorrhoids: Secondary | ICD-10-CM | POA: Insufficient documentation

## 2019-03-15 DIAGNOSIS — Z8719 Personal history of other diseases of the digestive system: Secondary | ICD-10-CM | POA: Diagnosis not present

## 2019-03-15 DIAGNOSIS — R933 Abnormal findings on diagnostic imaging of other parts of digestive tract: Secondary | ICD-10-CM | POA: Insufficient documentation

## 2019-03-15 DIAGNOSIS — Z87891 Personal history of nicotine dependence: Secondary | ICD-10-CM | POA: Insufficient documentation

## 2019-03-15 DIAGNOSIS — D124 Benign neoplasm of descending colon: Secondary | ICD-10-CM | POA: Insufficient documentation

## 2019-03-15 DIAGNOSIS — K635 Polyp of colon: Secondary | ICD-10-CM | POA: Diagnosis not present

## 2019-03-15 DIAGNOSIS — Z8673 Personal history of transient ischemic attack (TIA), and cerebral infarction without residual deficits: Secondary | ICD-10-CM | POA: Diagnosis not present

## 2019-03-15 DIAGNOSIS — E119 Type 2 diabetes mellitus without complications: Secondary | ICD-10-CM | POA: Diagnosis not present

## 2019-03-15 HISTORY — PX: POLYPECTOMY: SHX5525

## 2019-03-15 HISTORY — PX: COLONOSCOPY: SHX5424

## 2019-03-15 LAB — GLUCOSE, CAPILLARY: Glucose-Capillary: 176 mg/dL — ABNORMAL HIGH (ref 70–99)

## 2019-03-15 SURGERY — COLONOSCOPY
Anesthesia: Moderate Sedation

## 2019-03-15 MED ORDER — SODIUM CHLORIDE 0.9 % IV SOLN: INTRAVENOUS | Status: DC

## 2019-03-15 MED ORDER — MIDAZOLAM HCL 5 MG/5ML IJ SOLN
INTRAMUSCULAR | Status: DC | PRN
  Administered 2019-03-15: 2 mg via INTRAVENOUS
  Administered 2019-03-15: 1 mg via INTRAVENOUS
  Administered 2019-03-15: 2 mg via INTRAVENOUS

## 2019-03-15 MED ORDER — MEPERIDINE HCL 100 MG/ML IJ SOLN
INTRAMUSCULAR | Status: DC | PRN
  Administered 2019-03-15: 15 mg
  Administered 2019-03-15: 25 mg

## 2019-03-15 MED ORDER — ONDANSETRON HCL 4 MG/2ML IJ SOLN
INTRAMUSCULAR | Status: AC
  Filled 2019-03-15: qty 2

## 2019-03-15 MED ORDER — MIDAZOLAM HCL 5 MG/5ML IJ SOLN
INTRAMUSCULAR | Status: AC
  Filled 2019-03-15: qty 5

## 2019-03-15 MED ORDER — MEPERIDINE HCL 50 MG/ML IJ SOLN
INTRAMUSCULAR | Status: AC
  Filled 2019-03-15: qty 1

## 2019-03-15 MED ORDER — ONDANSETRON HCL 4 MG/2ML IJ SOLN
INTRAMUSCULAR | Status: DC | PRN
  Administered 2019-03-15: 4 mg via INTRAVENOUS

## 2019-03-15 NOTE — H&P (Signed)
$'@LOGO'R$ @   Primary Care Physician:  Sharilyn Sites, MD Primary Gastroenterologist:  Dr. Gala Romney  Pre-Procedure History & Physical: HPI:  David Pruitt is a 74 y.o. male here for further evaluation of abnormal sigmoid colon on CT last fall.  Treated for diverticulitis.  Abdominal pain and bleeding have resolved.  Here for diagnostic colonoscopy.  Past Medical History:  Diagnosis Date  . CVA (cerebral infarction)    2006  . Diabetes mellitus ([place])   . Diabetes mellitus without complication ([place])   . DM (dermatomyositis)   . Gastritis   . GERD (gastroesophageal reflux disease)   . Hemorrhoids   . Hypercholesteremia   . Hypertension   . Lower GI bleed    SECONDARY TO DIVERTICULA,S/P BLEED  . Shortness of breath dyspnea     Past Surgical History:  Procedure Laterality Date  . COLONOSCOPY  12/2009   RMR: Lower GI bleed due to a single diverticulum status post Endo clipping.  Scattered diverticulosis.  . COLONOSCOPY N/A 05/24/2014   Procedure: COLONOSCOPY;  Surgeon: Rogene Houston, MD;  Location: [facility];  Service: Endoscopy;  Laterality: N/A;  1030  . ESOPHAGOGASTRODUODENOSCOPY  12/2009   RMR: Mild esophagitis, gastritis, duodenitis.  Gastric biopsies negative for H. pylori    Prior to Admission medications   Medication Sig Start Date End Date Taking? Authorizing Provider  clopidogrel (PLAVIX) 75 MG tablet Take 75 mg by mouth daily.   Yes [provider]  Coenzyme Q10 (CO Q 10) 100 MG CAPS Take 100 mg by mouth daily.   Yes [provider]  ezetimibe-simvastatin (VYTORIN) 10-20 MG per tablet Take 1 tablet by mouth daily.   Yes [provider]  ferrous sulfate 325 (65 FE) MG tablet Take 325 mg by mouth 2 (two) times daily with a meal.   Yes [provider]  Ginkgo Biloba 120 MG CAPS Take 120 mg by mouth daily.   Yes [provider]  glipiZIDE (GLUCOTROL XL) 5 MG 24 hr tablet Take 5 mg by mouth daily.    Yes [provider]   glucosamine-chondroitin 500-400 MG tablet Take 1 tablet by mouth daily.   Yes [provider]  lisinopril (PRINIVIL,ZESTRIL) 10 MG tablet Take 10 mg by mouth daily.   Yes [provider]  loratadine (CLARITIN) 10 MG tablet Take 10 mg daily by mouth.   Yes [provider]  meloxicam (MOBIC) 15 MG tablet Take 15 mg by mouth daily.   Yes [provider]  Multiple Vitamins-Minerals (MULTIVITAMIN WITH MINERALS) tablet Take 1 tablet by mouth daily.   Yes [provider]  Na Sulfate-K Sulfate-Mg Sulf (SUPREP BOWEL PREP KIT) 17.5-3.13-1.6 GM/177ML SOLN Take 1 kit by mouth as directed. 02/16/19  Yes Renel Ende, Cristopher Estimable, MD  Omega 3 1200 MG CAPS Take 1,200 mg by mouth daily.   Yes [provider]  simvastatin (ZOCOR) 20 MG tablet Take 20 mg by mouth at bedtime. 10/14/18  Yes [provider]  sitaGLIPtin-metformin (JANUMET) 50-1000 MG tablet Take 1 tablet 2 (two) times daily with a meal by mouth.   Yes [provider]  vitamin B-12 (CYANOCOBALAMIN) 1000 MCG tablet Take 1,000 mcg daily by mouth.   Yes [provider]  acetaminophen (TYLENOL) 500 MG tablet Take 1,000 mg by mouth every 6 (six) hours as needed for headache.     [provider]  amoxicillin-clavulanate (AUGMENTIN) 875-125 MG tablet Take 1 tablet by mouth every 12 (twelve) hours. Patient not taking: Reported on  02/24/2019 12/02/18   Triplett, Tammy, PA-C  Na Sulfate-K Sulfate-Mg Sulf (SUPREP BOWEL PREP KIT) 17.5-3.13-1.6 GM/177ML SOLN Take 1 kit by mouth as directed. 03/06/19   Daneil Dolin, MD    Allergies as of 12/12/2018  . (No Known Allergies)    Family History  Problem Relation Age of Onset  . Colon cancer Father   . Stroke Father     Social History   Socioeconomic History  . Marital status: Married    Spouse name: Not on file  . Number of children: Not on file  . Years of education: Not on file  . Highest education level: Not on file   Occupational History  . Not on file  Tobacco Use  . Smoking status: Former Smoker    Packs/day: 1.00    Years: 40.00    Pack years: 40.00    Types: Cigarettes  . Smokeless tobacco: Never Used  Substance and Sexual Activity  . Alcohol use: Yes    Comment: occasionally  . Drug use: No  . Sexual activity: Not on file  Other Topics Concern  . Not on file  Social History Narrative  . Not on file   Social Determinants of Health   Financial Resource Strain:   . Difficulty of Paying Living Expenses: Not on file  Food Insecurity:   . Worried About Charity fundraiser in the Last Year: Not on file  . Ran Out of Food in the Last Year: Not on file  Transportation Needs:   . Lack of Transportation (Medical): Not on file  . Lack of Transportation (Non-Medical): Not on file  Physical Activity:   . Days of Exercise per Week: Not on file  . Minutes of Exercise per Session: Not on file  Stress:   . Feeling of Stress : Not on file  Social Connections:   . Frequency of Communication with Friends and Family: Not on file  . Frequency of Social Gatherings with Friends and Family: Not on file  . Attends Religious Services: Not on file  . Active Member of Clubs or Organizations: Not on file  . Attends Archivist Meetings: Not on file  . Marital Status: Not on file  Intimate Partner Violence:   . Fear of Current or Ex-Partner: Not on file  . Emotionally Abused: Not on file  . Physically Abused: Not on file  . Sexually Abused: Not on file    Review of Systems: See HPI, otherwise negative ROS  Physical Exam: BP (!) 149/93   Pulse (!) 113   Temp 97.8 F (36.6 C) (Oral)   Resp 15   Ht '5\' 10"'$  (1.778 m)   Wt 99.8 kg   SpO2 97%   BMI 31.57 kg/m  General:   Alert,  Well-developed, well-nourished, pleasant and cooperative in NAD Skin:  Intact without significant lesions or rashes. Neck:  Supple; no masses or thyromegaly. No significant cervical adenopathy. Lungs:  Clear  throughout to auscultation.   No wheezes, crackles, or rhonchi. No acute distress. Heart:  Regular rate and rhythm; no murmurs, clicks, rubs,  or gallops. Abdomen: Non-distended, normal bowel sounds.  Soft and nontender without appreciable mass or hepatosplenomegaly.  Pulses:  Normal pulses noted. Extremities:  Without clubbing or edema.  Impression/Plan: Abnormal colon on CT.  Here for diagnostic colonoscopy per plan.  The risks, benefits, limitations, alternatives and imponderables have been reviewed with the patient. Questions have been answered. All parties are agreeable.      Notice:  This dictation was prepared with Dragon dictation along with smaller phrase technology. Any transcriptional errors that result from this process are unintentional and may not be corrected upon review.

## 2019-03-15 NOTE — Discharge Instructions (Signed)
Colonoscopy Discharge Instructions  Read the instructions outlined below and refer to this sheet in the next few weeks. These discharge instructions provide you with general information on caring for yourself after you leave the hospital. Your doctor may also give you specific instructions. While your treatment has been planned according to the most current medical practices available, unavoidable complications occasionally occur. If you have any problems or questions after discharge, call Dr. Gala Romney at [phone]. ACTIVITY  You may resume your regular activity, but move at a slower pace for the next 24 hours.   Take frequent rest periods for the next 24 hours.   Walking will help get rid of the air and reduce the bloated feeling in your belly (abdomen).   No driving for 24 hours (because of the medicine (anesthesia) used during the test).    Do not sign any important legal documents or operate any machinery for 24 hours (because of the anesthesia used during the test).  NUTRITION  Drink plenty of fluids.   You may resume your normal diet as instructed by your doctor.   Begin with a light meal and progress to your normal diet. Heavy or fried foods are harder to digest and may make you feel sick to your stomach (nauseated).   Avoid alcoholic beverages for 24 hours or as instructed.  MEDICATIONS  You may resume your normal medications unless your doctor tells you otherwise.  WHAT YOU CAN EXPECT TODAY  Some feelings of bloating in the abdomen.   Passage of more gas than usual.   Spotting of blood in your stool or on the toilet paper.  IF YOU HAD POLYPS REMOVED DURING THE COLONOSCOPY:  No aspirin products for 7 days or as instructed.   No alcohol for 7 days or as instructed.   Eat a soft diet for the next 24 hours.  FINDING OUT THE RESULTS OF YOUR TEST Not all test results are available during your visit. If your test results are not back during the visit, make an appointment  with your caregiver to find out the results. Do not assume everything is normal if you have not heard from your caregiver or the medical facility. It is important for you to follow up on all of your test results.  SEEK IMMEDIATE MEDICAL ATTENTION IF:  You have more than a spotting of blood in your stool.   Your belly is swollen (abdominal distention).   You are nauseated or vomiting.   You have a temperature over 101.   You have abdominal pain or discomfort that is severe or gets worse throughout the day.    Colon polyp, hemorrhoid and diverticulosis information provided  Further recommendations to follow pending review of pathology report  Patient request I called wife at [phone] and reviewed results.   Hemorrhoids Hemorrhoids are swollen veins in and around the rectum or anus. There are two types of hemorrhoids:  Internal hemorrhoids. These occur in the veins that are just inside the rectum. They may poke through to the outside and become irritated and painful.  External hemorrhoids. These occur in the veins that are outside the anus and can be felt as a painful swelling or hard lump near the anus. Most hemorrhoids do not cause serious problems, and they can be managed with home treatments such as diet and lifestyle changes. If home treatments do not help the symptoms, procedures can be done to shrink or remove the hemorrhoids. What are the causes? This condition is caused by increased  pressure in the anal area. This pressure may result from various things, including:  Constipation.  Straining to have a bowel movement.  Diarrhea.  Pregnancy.  Obesity.  Sitting for long periods of time.  Heavy lifting or other activity that causes you to strain.  Anal sex.  Riding a bike for a long period of time. What are the signs or symptoms? Symptoms of this condition include:  Pain.  Anal itching or irritation.  Rectal bleeding.  Leakage of stool (feces).  Anal  swelling.  One or more lumps around the anus. How is this diagnosed? This condition can often be diagnosed through a visual exam. Other exams or tests may also be done, such as:  An exam that involves feeling the rectal area with a gloved hand (digital rectal exam).  An exam of the anal canal that is done using a small tube (anoscope).  A blood test, if you have lost a significant amount of blood.  A test to look inside the colon using a flexible tube with a camera on the end (sigmoidoscopy or colonoscopy). How is this treated? This condition can usually be treated at home. However, various procedures may be done if dietary changes, lifestyle changes, and other home treatments do not help your symptoms. These procedures can help make the hemorrhoids smaller or remove them completely. Some of these procedures involve surgery, and others do not. Common procedures include:  Rubber band ligation. Rubber bands are placed at the base of the hemorrhoids to cut off their blood supply.  Sclerotherapy. Medicine is injected into the hemorrhoids to shrink them.  Infrared coagulation. A type of light energy is used to get rid of the hemorrhoids.  Hemorrhoidectomy surgery. The hemorrhoids are surgically removed, and the veins that supply them are tied off.  Stapled hemorrhoidopexy surgery. The surgeon staples the base of the hemorrhoid to the rectal wall. Follow these instructions at home: Eating and drinking   Eat foods that have a lot of fiber in them, such as whole grains, beans, nuts, fruits, and vegetables.  Ask your health care provider about taking products that have added fiber (fiber supplements).  Reduce the amount of fat in your diet. You can do this by eating low-fat dairy products, eating less red meat, and avoiding processed foods.  Drink enough fluid to keep your urine pale yellow. Managing pain and swelling   Take warm sitz baths for 20 minutes, 3-4 times a day to ease pain  and discomfort. You may do this in a bathtub or using a portable sitz bath that fits over the toilet.  If directed, apply ice to the affected area. Using ice packs between sitz baths may be helpful. ? Put ice in a plastic bag. ? Place a towel between your skin and the bag. ? Leave the ice on for 20 minutes, 2-3 times a day. General instructions  Take over-the-counter and prescription medicines only as told by your health care provider.  Use medicated creams or suppositories as told.  Get regular exercise. Ask your health care provider how much and what kind of exercise is best for you. In general, you should do moderate exercise for at least 30 minutes on most days of the week (150 minutes each week). This can include activities such as walking, biking, or yoga.  Go to the bathroom when you have the urge to have a bowel movement. Do not wait.  Avoid straining to have bowel movements.  Keep the anal area dry and clean.  Use wet toilet paper or moist towelettes after a bowel movement.  Do not sit on the toilet for long periods of time. This increases blood pooling and pain.  Keep all follow-up visits as told by your health care provider. This is important. Contact a health care provider if you have:  Increasing pain and swelling that are not controlled by treatment or medicine.  Difficulty having a bowel movement, or you are unable to have a bowel movement.  Pain or inflammation outside the area of the hemorrhoids. Get help right away if you have:  Uncontrolled bleeding from your rectum. Summary  Hemorrhoids are swollen veins in and around the rectum or anus.  Most hemorrhoids can be managed with home treatments such as diet and lifestyle changes.  Taking warm sitz baths can help ease pain and discomfort.  In severe cases, procedures or surgery can be done to shrink or remove the hemorrhoids. This information is not intended to replace advice given to you by your health care  provider. Make sure you discuss any questions you have with your health care provider. Document Revised: 05/27/2018 Document Reviewed: 05/20/2017 Elsevier Patient Education  [place].  Diverticulosis  Diverticulosis is a condition that develops when small pouches (diverticula) form in the wall of the large intestine (colon). The colon is where water is absorbed and stool (feces) is formed. The pouches form when the inside layer of the colon pushes through weak spots in the outer layers of the colon. You may have a few pouches or many of them. The pouches usually do not cause problems unless they become inflamed or infected. When this happens, the condition is called diverticulitis. What are the causes? The cause of this condition is not known. What increases the risk? The following factors may make you more likely to develop this condition:  Being older than age 25. Your risk for this condition increases with age. Diverticulosis is rare among people younger than age 48. By age 98, many people have it.  Eating a low-fiber diet.  Having frequent constipation.  Being overweight.  Not getting enough exercise.  Smoking.  Taking over-the-counter pain medicines, like aspirin and ibuprofen.  Having a family history of diverticulosis. What are the signs or symptoms? In most people, there are no symptoms of this condition. If you do have symptoms, they may include:  Bloating.  Cramps in the abdomen.  Constipation or diarrhea.  Pain in the lower left side of the abdomen. How is this diagnosed? Because diverticulosis usually has no symptoms, it is most often diagnosed during an exam for other colon problems. The condition may be diagnosed by:  Using a flexible scope to examine the colon (colonoscopy).  Taking an X-ray of the colon after dye has been put into the colon (barium enema).  Having a CT scan. How is this treated? You may not need treatment for this condition.  Your health care provider may recommend treatment to prevent problems. You may need treatment if you have symptoms or if you previously had diverticulitis. Treatment may include:  Eating a high-fiber diet.  Taking a fiber supplement.  Taking a live bacteria supplement (probiotic).  Taking medicine to relax your colon. Follow these instructions at home: Medicines  Take over-the-counter and prescription medicines only as told by your health care provider.  If told by your health care provider, take a fiber supplement or probiotic. Constipation prevention Your condition may cause constipation. To prevent or treat constipation, you may need to:  Drink enough fluid to keep your urine pale yellow.  Take over-the-counter or prescription medicines.  Eat foods that are high in fiber, such as beans, whole grains, and fresh fruits and vegetables.  Limit foods that are high in fat and processed sugars, such as fried or sweet foods.  General instructions  Try not to strain when you have a bowel movement.  Keep all follow-up visits as told by your health care provider. This is important. Contact a health care provider if you:  Have pain in your abdomen.  Have bloating.  Have cramps.  Have not had a bowel movement in 3 days. Get help right away if:  Your pain gets worse.  Your bloating becomes very bad.  You have a fever or chills, and your symptoms suddenly get worse.  You vomit.  You have bowel movements that are bloody or black.  You have bleeding from your rectum. Summary  Diverticulosis is a condition that develops when small pouches (diverticula) form in the wall of the large intestine (colon).  You may have a few pouches or many of them.  This condition is most often diagnosed during an exam for other colon problems.  Treatment may include increasing the fiber in your diet, taking supplements, or taking medicines. This information is not intended to replace  advice given to you by your health care provider. Make sure you discuss any questions you have with your health care provider. Document Revised: 07/28/2018 Document Reviewed: 07/28/2018 Elsevier Patient Education  [place].  Colon Polyps  Polyps are tissue growths inside the body. Polyps can grow in many places, including the large intestine (colon). A polyp may be a round bump or a mushroom-shaped growth. You could have one polyp or several. Most colon polyps are noncancerous (benign). However, some colon polyps can become cancerous over time. Finding and removing the polyps early can help prevent this. What are the causes? The exact cause of colon polyps is not known. What increases the risk? You are more likely to develop this condition if you:  Have a family history of colon cancer or colon polyps.  Are older than 6 or older than 45 if you are African American.  Have inflammatory bowel disease, such as ulcerative colitis or Crohn's disease.  Have certain hereditary conditions, such as: ? Familial adenomatous polyposis. ? Lynch syndrome. ? Turcot syndrome. ? Peutz-Jeghers syndrome.  Are overweight.  Smoke cigarettes.  Do not get enough exercise.  Drink too much alcohol.  Eat a diet that is high in fat and red meat and low in fiber.  Had childhood cancer that was treated with abdominal radiation. What are the signs or symptoms? Most polyps do not cause symptoms. If you have symptoms, they may include:  Blood coming from your rectum when having a bowel movement.  Blood in your stool. The stool may look dark red or black.  Abdominal pain.  A change in bowel habits, such as constipation or diarrhea. How is this diagnosed? This condition is diagnosed with a colonoscopy. This is a procedure in which a lighted, flexible scope is inserted into the anus and then passed into the colon to examine the area. Polyps are sometimes found when a colonoscopy is done as  part of routine cancer screening tests. How is this treated? Treatment for this condition involves removing any polyps that are found. Most polyps can be removed during a colonoscopy. Those polyps will then be tested for cancer. Additional treatment may be needed depending  on the results of testing. Follow these instructions at home: Lifestyle  Maintain a healthy weight, or lose weight if recommended by your health care provider.  Exercise every day or as told by your health care provider.  Do not use any products that contain nicotine or tobacco, such as cigarettes and e-cigarettes. If you need help quitting, ask your health care provider.  If you drink alcohol, limit how much you have: ? 0-1 drink a day for women. ? 0-2 drinks a day for men.  Be aware of how much alcohol is in your drink. In the [place] one drink equals one 12 oz bottle of beer (355 mL), one 5 oz glass of wine (148 mL), or one 1 oz shot of hard liquor (44 mL). Eating and drinking   Eat foods that are high in fiber, such as fruits, vegetables, and whole grains.  Eat foods that are high in calcium and vitamin D, such as milk, cheese, yogurt, eggs, liver, fish, and broccoli.  Limit foods that are high in fat, such as fried foods and desserts.  Limit the amount of red meat and processed meat you eat, such as hot dogs, sausage, bacon, and lunch meats. General instructions  Keep all follow-up visits as told by your health care provider. This is important. ? This includes having regularly scheduled colonoscopies. ? Talk to your health care provider about when you need a colonoscopy. Contact a health care provider if:  You have new or worsening bleeding during a bowel movement.  You have new or increased blood in your stool.  You have a change in bowel habits.  You lose weight for no known reason. Summary  Polyps are tissue growths inside the body. Polyps can grow in many places, including the colon.  Most colon  polyps are noncancerous (benign), but some can become cancerous over time.  This condition is diagnosed with a colonoscopy.  Treatment for this condition involves removing any polyps that are found. Most polyps can be removed during a colonoscopy. This information is not intended to replace advice given to you by your health care provider. Make sure you discuss any questions you have with your health care provider. Document Revised: 04/15/2017 Document Reviewed: 04/15/2017 Elsevier Patient Education  [place].

## 2019-03-15 NOTE — Op Note (Signed)
[facility] Patient Name: David Pruitt Procedure Date: 03/15/2019 7:21 AM MRN: [id number] Date of Birth: 21-Apr-1945 Attending MD: Norvel Richards , MD CSN: [id number] Age: 74 Admit Type: Outpatient Procedure:                Colonoscopy Indications:              Abnormal CT of the GI tract Providers:                Norvel Richards, MD, Janeece Riggers, RN, Nelma Rothman, Technician Referring MD:              Medicines:                Midazolam 5 mg IV, Meperidine 40 mg IV Complications:            No immediate complications. Estimated Blood Loss:     Estimated blood loss was minimal. Procedure:                After obtaining informed consent, the colonoscope                            was passed under direct vision. Throughout the                            procedure, the patient's blood pressure, pulse, and                            oxygen saturations were monitored continuously. The                            [id number] scope was introduced through                            the anus and advanced to the the cecum, identified                            by appendiceal orifice and ileocecal valve. Scope In: 7:48:50 AM Scope Out: 8:13:54 AM Scope Withdrawal Time: 0 hours 19 minutes 21 seconds  Total Procedure Duration: 0 hours 25 minutes 4 seconds  Findings:      The perianal and digital rectal examinations were normal.      Multiple medium-mouthed diverticula were found in the sigmoid colon.      Five sessile polyps were found in the descending colon and ascending       colon. The polyps were 5 to 8 mm in size. These polyps were removed with       a cold snare. Resection and retrieval were complete. Estimated blood       loss was minimal.      Non-bleeding internal hemorrhoids were found during retroflexion. The       hemorrhoids were mild, small and Grade I (internal hemorrhoids that do       not prolapse).      The exam was otherwise  without abnormality on direct and retroflexion       views. Impression:               -  Diverticulosis in the sigmoid colon.                           - Five 5 to 8 mm polyps in the descending colon and                            in the ascending colon, removed with a cold snare.                            Resected and retrieved.                           - Non-bleeding internal hemorrhoids.                           - The examination was otherwise normal on direct                            and retroflexion views. Moderate Sedation:      Moderate (conscious) sedation was administered by the endoscopy nurse       and supervised by the endoscopist. The following parameters were       monitored: oxygen saturation, heart rate, blood pressure, respiratory       rate, EKG, adequacy of pulmonary ventilation, and response to care.       Total physician intraservice time was 30 minutes. Recommendation:           - Patient has a contact number available for                            emergencies. The signs and symptoms of potential                            delayed complications were discussed with the                            patient. Return to normal activities tomorrow.                            Written discharge instructions were provided to the                            patient.                           - Resume previous diet.                           - Continue present medications.                           - Repeat colonoscopy date to be determined after                            pending pathology results are reviewed for  surveillance.                           - Return to GI office (date not yet determined). Procedure Code(s):        --- Professional ---                           [id number], Colonoscopy, flexible; with removal of                            tumor(s), polyp(s), or other lesion(s) by snare                            technique                            99153, Moderate sedation; each additional 15                            minutes intraservice time                           G0500, Moderate sedation services provided by the                            same physician or other qualified health care                            professional performing a gastrointestinal                            endoscopic service that sedation supports,                            requiring the presence of an independent trained                            observer to assist in the monitoring of the                            patient's level of consciousness and physiological                            status; initial 15 minutes of intra-service time;                            patient age 45 years or older (additional time may                            be reported with [phone], as appropriate) Diagnosis Code(s):        --- Professional ---                           K64.0, First degree hemorrhoids  K63.5, Polyp of colon                           K57.30, Diverticulosis of large intestine without                            perforation or abscess without bleeding                           R93.3, Abnormal findings on diagnostic imaging of                            other parts of digestive tract CPT copyright 2019 [employer]. All rights reserved. The codes documented in this report are preliminary and upon coder review may  be revised to meet current compliance requirements. Cristopher Estimable. Lytle Malburg, MD Norvel Richards, MD 03/15/2019 8:25:15 AM This report has been signed electronically. Number of Addenda: 0

## 2019-03-16 ENCOUNTER — Encounter: Payer: Self-pay | Admitting: Internal Medicine

## 2019-03-16 LAB — SURGICAL PATHOLOGY

## 2019-03-24 DIAGNOSIS — N4883 Acquired buried penis: Secondary | ICD-10-CM | POA: Diagnosis not present

## 2019-05-12 DIAGNOSIS — N4883 Acquired buried penis: Secondary | ICD-10-CM | POA: Diagnosis not present

## 2019-05-12 DIAGNOSIS — N35919 Unspecified urethral stricture, male, unspecified site: Secondary | ICD-10-CM | POA: Diagnosis not present

## 2019-05-12 DIAGNOSIS — J449 Chronic obstructive pulmonary disease, unspecified: Secondary | ICD-10-CM | POA: Diagnosis not present

## 2019-05-12 DIAGNOSIS — E785 Hyperlipidemia, unspecified: Secondary | ICD-10-CM | POA: Diagnosis not present

## 2019-05-12 DIAGNOSIS — G4733 Obstructive sleep apnea (adult) (pediatric): Secondary | ICD-10-CM | POA: Diagnosis not present

## 2019-05-12 DIAGNOSIS — I10 Essential (primary) hypertension: Secondary | ICD-10-CM | POA: Diagnosis not present

## 2019-05-12 DIAGNOSIS — Z8673 Personal history of transient ischemic attack (TIA), and cerebral infarction without residual deficits: Secondary | ICD-10-CM | POA: Diagnosis not present

## 2019-05-12 DIAGNOSIS — E119 Type 2 diabetes mellitus without complications: Secondary | ICD-10-CM | POA: Diagnosis not present

## 2019-05-15 DIAGNOSIS — Z9189 Other specified personal risk factors, not elsewhere classified: Secondary | ICD-10-CM | POA: Diagnosis not present

## 2019-05-15 DIAGNOSIS — Z20822 Contact with and (suspected) exposure to covid-19: Secondary | ICD-10-CM | POA: Diagnosis not present

## 2019-05-22 DIAGNOSIS — J449 Chronic obstructive pulmonary disease, unspecified: Secondary | ICD-10-CM | POA: Diagnosis not present

## 2019-05-22 DIAGNOSIS — N480 Leukoplakia of penis: Secondary | ICD-10-CM | POA: Diagnosis not present

## 2019-05-22 DIAGNOSIS — I10 Essential (primary) hypertension: Secondary | ICD-10-CM | POA: Diagnosis not present

## 2019-05-22 DIAGNOSIS — E785 Hyperlipidemia, unspecified: Secondary | ICD-10-CM | POA: Diagnosis not present

## 2019-05-22 DIAGNOSIS — N35919 Unspecified urethral stricture, male, unspecified site: Secondary | ICD-10-CM | POA: Diagnosis not present

## 2019-05-22 DIAGNOSIS — N35914 Unspecified anterior urethral stricture, male: Secondary | ICD-10-CM | POA: Diagnosis not present

## 2019-05-22 DIAGNOSIS — N481 Balanitis: Secondary | ICD-10-CM | POA: Diagnosis not present

## 2019-05-22 DIAGNOSIS — N4883 Acquired buried penis: Secondary | ICD-10-CM | POA: Diagnosis not present

## 2019-05-22 DIAGNOSIS — G4733 Obstructive sleep apnea (adult) (pediatric): Secondary | ICD-10-CM | POA: Diagnosis not present

## 2019-05-23 DIAGNOSIS — N35919 Unspecified urethral stricture, male, unspecified site: Secondary | ICD-10-CM | POA: Diagnosis not present

## 2019-05-23 DIAGNOSIS — E785 Hyperlipidemia, unspecified: Secondary | ICD-10-CM | POA: Diagnosis not present

## 2019-05-23 DIAGNOSIS — N4883 Acquired buried penis: Secondary | ICD-10-CM | POA: Diagnosis not present

## 2019-05-23 DIAGNOSIS — I10 Essential (primary) hypertension: Secondary | ICD-10-CM | POA: Diagnosis not present

## 2019-05-23 DIAGNOSIS — J449 Chronic obstructive pulmonary disease, unspecified: Secondary | ICD-10-CM | POA: Diagnosis not present

## 2019-05-23 DIAGNOSIS — G4733 Obstructive sleep apnea (adult) (pediatric): Secondary | ICD-10-CM | POA: Diagnosis not present

## 2019-06-02 DIAGNOSIS — Z466 Encounter for fitting and adjustment of urinary device: Secondary | ICD-10-CM | POA: Diagnosis not present

## 2019-06-02 DIAGNOSIS — R339 Retention of urine, unspecified: Secondary | ICD-10-CM | POA: Diagnosis not present

## 2019-06-22 DIAGNOSIS — N35914 Unspecified anterior urethral stricture, male: Secondary | ICD-10-CM | POA: Insufficient documentation

## 2019-08-03 DIAGNOSIS — E6609 Other obesity due to excess calories: Secondary | ICD-10-CM | POA: Diagnosis not present

## 2019-08-03 DIAGNOSIS — Z6830 Body mass index (BMI) 30.0-30.9, adult: Secondary | ICD-10-CM | POA: Diagnosis not present

## 2019-08-03 DIAGNOSIS — R000 Tachycardia, unspecified: Secondary | ICD-10-CM | POA: Diagnosis not present

## 2019-08-22 DIAGNOSIS — Z8042 Family history of malignant neoplasm of prostate: Secondary | ICD-10-CM | POA: Diagnosis not present

## 2019-08-22 DIAGNOSIS — N480 Leukoplakia of penis: Secondary | ICD-10-CM | POA: Diagnosis not present

## 2019-08-22 DIAGNOSIS — Z87442 Personal history of urinary calculi: Secondary | ICD-10-CM | POA: Diagnosis not present

## 2019-08-22 DIAGNOSIS — N35914 Unspecified anterior urethral stricture, male: Secondary | ICD-10-CM | POA: Diagnosis not present

## 2019-09-01 DIAGNOSIS — N4883 Acquired buried penis: Secondary | ICD-10-CM | POA: Diagnosis not present

## 2019-09-07 ENCOUNTER — Ambulatory Visit: Payer: Medicare Other | Attending: Internal Medicine

## 2019-09-07 DIAGNOSIS — Z23 Encounter for immunization: Secondary | ICD-10-CM

## 2019-09-07 NOTE — Progress Notes (Signed)
   Covid-19 Vaccination Clinic  Name:  DAMONT BALLES    MRN: [id number] DOB: Jun 22, 1945  09/07/2019  Mr. Cortright was observed post Covid-19 immunization for 15 minutes without incident. He was provided with Vaccine Information Sheet and instruction to access the V-Safe system.   Mr. Diveley was instructed to call 911 with any severe reactions post vaccine: Marland Kitchen Difficulty breathing  . Swelling of face and throat  . A fast heartbeat  . A bad rash all over body  . Dizziness and weakness

## 2019-09-12 DIAGNOSIS — Z7984 Long term (current) use of oral hypoglycemic drugs: Secondary | ICD-10-CM | POA: Diagnosis not present

## 2019-09-12 DIAGNOSIS — E1140 Type 2 diabetes mellitus with diabetic neuropathy, unspecified: Secondary | ICD-10-CM | POA: Diagnosis not present

## 2019-09-12 DIAGNOSIS — I10 Essential (primary) hypertension: Secondary | ICD-10-CM | POA: Diagnosis not present

## 2019-09-12 DIAGNOSIS — K219 Gastro-esophageal reflux disease without esophagitis: Secondary | ICD-10-CM | POA: Diagnosis not present

## 2019-10-09 DIAGNOSIS — Z7984 Long term (current) use of oral hypoglycemic drugs: Secondary | ICD-10-CM | POA: Diagnosis not present

## 2019-10-09 DIAGNOSIS — H52223 Regular astigmatism, bilateral: Secondary | ICD-10-CM | POA: Diagnosis not present

## 2019-10-09 DIAGNOSIS — E119 Type 2 diabetes mellitus without complications: Secondary | ICD-10-CM | POA: Diagnosis not present

## 2019-10-09 DIAGNOSIS — H2513 Age-related nuclear cataract, bilateral: Secondary | ICD-10-CM | POA: Diagnosis not present

## 2019-10-09 DIAGNOSIS — H524 Presbyopia: Secondary | ICD-10-CM | POA: Diagnosis not present

## 2019-10-17 DIAGNOSIS — E538 Deficiency of other specified B group vitamins: Secondary | ICD-10-CM | POA: Diagnosis not present

## 2019-10-17 DIAGNOSIS — E1165 Type 2 diabetes mellitus with hyperglycemia: Secondary | ICD-10-CM | POA: Diagnosis not present

## 2019-10-17 DIAGNOSIS — Z6830 Body mass index (BMI) 30.0-30.9, adult: Secondary | ICD-10-CM | POA: Diagnosis not present

## 2019-10-17 DIAGNOSIS — Z23 Encounter for immunization: Secondary | ICD-10-CM | POA: Diagnosis not present

## 2019-10-17 DIAGNOSIS — I10 Essential (primary) hypertension: Secondary | ICD-10-CM | POA: Diagnosis not present

## 2019-10-17 DIAGNOSIS — E7849 Other hyperlipidemia: Secondary | ICD-10-CM | POA: Diagnosis not present

## 2019-10-25 ENCOUNTER — Encounter: Payer: Self-pay | Admitting: Gastroenterology

## 2019-10-25 ENCOUNTER — Ambulatory Visit (INDEPENDENT_AMBULATORY_CARE_PROVIDER_SITE_OTHER): Payer: Medicare Other | Admitting: Gastroenterology

## 2019-10-25 ENCOUNTER — Other Ambulatory Visit: Payer: Self-pay

## 2019-10-25 DIAGNOSIS — K5732 Diverticulitis of large intestine without perforation or abscess without bleeding: Secondary | ICD-10-CM

## 2019-10-25 DIAGNOSIS — K625 Hemorrhage of anus and rectum: Secondary | ICD-10-CM | POA: Insufficient documentation

## 2019-10-25 MED ORDER — AMOXICILLIN-POT CLAVULANATE 875-125 MG PO TABS: 1.0000 | ORAL_TABLET | Freq: Two times a day (BID) | ORAL | 0 refills | Status: AC

## 2019-10-25 NOTE — Progress Notes (Signed)
Primary Care Physician: Sharilyn Sites, MD  Primary Gastroenterologist:  Garfield Cornea, MD   Chief Complaint  Patient presents with  . Rectal Bleeding    x 4-5 days, solid stool, not straining. Reports last time this happened he had infected colon    HPI: JAHLANI LORENTZ is a 74 y.o. male here for follow-up.  Patient last seen in November 2020 for acute uncomplicated sigmoid diverticulitis and several episodes of large-volume hematochezia.  History of diverticular bleed in 2011.  Required therapeutic intervention.  Updated colonoscopy back in March.  He had diverticulosis and 5 polyps removed.  Multiple tubular adenomas.  Next colonoscopy recommended for 3 years.  New fecal urgency since last colonoscopy. Sometimes urgent and has to find bathroom quick. No fecal incontinence. With episodes of fecal urgency his stools will be loose but usually no more than one BM daily. Saturday 3 BMs. Some blood in it. Less blood noted Sunday. Last episode of blood in stool was Tuesday, small amount. This morning no blood in it. Sometimes stools are very dark, but still brown. Very minimal lower abdominal pain.Good appetite. No recent antibiotics. Seems very similar to symptoms when he had diverticulitis before.   Current Outpatient Medications  Medication Sig Dispense Refill  . acetaminophen (TYLENOL) 500 MG tablet Take 1,000 mg by mouth every 6 (six) hours as needed for headache.     . clopidogrel (PLAVIX) 75 MG tablet Take 75 mg by mouth daily.    . Coenzyme Q10 (CO Q 10) 100 MG CAPS Take 100 mg by mouth daily.    Marland Kitchen ezetimibe-simvastatin (VYTORIN) 10-20 MG per tablet Take 1 tablet by mouth daily.    Marland Kitchen glipiZIDE (GLUCOTROL XL) 5 MG 24 hr tablet Take 5 mg by mouth daily.     Marland Kitchen glucosamine-chondroitin 500-400 MG tablet Take 1 tablet by mouth daily.    Marland Kitchen lisinopril (PRINIVIL,ZESTRIL) 10 MG tablet Take 10 mg by mouth daily.    Marland Kitchen loratadine (CLARITIN) 10 MG tablet Take 10 mg daily by mouth.    .  meloxicam (MOBIC) 15 MG tablet Take 15 mg by mouth daily.    . metoprolol succinate (TOPROL-XL) 50 MG 24 hr tablet Take 50 mg by mouth daily.    . Multiple Vitamins-Minerals (MULTIVITAMIN WITH MINERALS) tablet Take 1 tablet by mouth daily.    . pantoprazole (PROTONIX) 40 MG tablet Take 40 mg by mouth daily.    . simvastatin (ZOCOR) 20 MG tablet Take 20 mg by mouth at bedtime.    . sitaGLIPtin-metformin (JANUMET) 50-1000 MG tablet Take 1 tablet 2 (two) times daily with a meal by mouth.    . vitamin B-12 (CYANOCOBALAMIN) 1000 MCG tablet Take 1,000 mcg daily by mouth.     No current facility-administered medications for this visit.    Allergies as of 10/25/2019  . (No Known Allergies)    ROS:  General: Negative for anorexia, weight loss, fever, chills, fatigue, weakness. ENT: Negative for hoarseness, difficulty swallowing , nasal congestion. CV: Negative for chest pain, angina, palpitations, dyspnea on exertion, peripheral edema.  Respiratory: Negative for dyspnea at rest, dyspnea on exertion, cough, sputum, wheezing.  GI: See history of present illness. GU:  Negative for dysuria, hematuria, urinary incontinence, urinary frequency, nocturnal urination.  Endo: Negative for unusual weight change.    Physical Examination:   BP 134/80   Pulse 77   Temp (!) 97.5 F (36.4 C) (Oral)   Ht 5\' 10"  (1.778 m)   Wt 207 lb  9.6 oz (94.2 kg)   BMI 29.79 kg/m   General: Well-nourished, well-developed in no acute distress.  Eyes: No icterus. Mouth: masked. Lungs: Clear to auscultation bilaterally.  Heart: Regular rate and rhythm, no murmurs rubs or gallops.  Abdomen: Bowel sounds are normal, nondistended, no hepatosplenomegaly or masses, no abdominal bruits or hernia , no rebound or guarding. Mild llq tenderness Extremities: No lower extremity edema. No clubbing or deformities. Neuro: Alert and oriented x 4   Skin: Warm and dry, no jaundice.   Psych: Alert and cooperative, normal mood and  affect.   Imaging Studies: No results found.  Impression/Plan:  Pleasant 74 y/o male presenting for further evaluation of recurrent rectal bleeding and fecal urgency. Similar symptoms to when he had diverticulitis last year, well documented on CT 11/2018. Follow up colonoscopy 03/2019 showed diverticulosis and several colon polyps removed. Since his colonoscopy he has had some urgency for BMs, although no more than one stool per day. No medication changes. developes rectal bleeding over the weekend which is tapering off with no blood in the stool yesterday. Mild lower abdominal discomfort on exam. May have mild diverticulitis vs diverticular bleeding (resolving). Empiric treatment with augmentin for 10 days. Monitor for further bleeding. Once finished with antibiotics, restart high fiber diet and add good fiber supplement as outlined. He should call with ongoing fecal urgency or persistent diarrhea/rectal bleeding.

## 2019-10-25 NOTE — Patient Instructions (Signed)
1. Start Augmentin one tablet twice daily with food for 10 days for diverticulitis. 2. After you complete Augmentin, increase dietary fiber and restart fiber supplement.  3. Good fiber supplements include: FiberChoice chew two daily. Benefiber chewables or powder per package instructions to achieve 3 to 4 grams of fiber per day.  4. Call if you continue to have stool urgency or develop persistent diarrhea.  5. Call if ongoing rectal bleeding.

## 2019-11-11 DIAGNOSIS — I10 Essential (primary) hypertension: Secondary | ICD-10-CM | POA: Diagnosis not present

## 2019-11-11 DIAGNOSIS — E1140 Type 2 diabetes mellitus with diabetic neuropathy, unspecified: Secondary | ICD-10-CM | POA: Diagnosis not present

## 2019-11-11 DIAGNOSIS — K219 Gastro-esophageal reflux disease without esophagitis: Secondary | ICD-10-CM | POA: Diagnosis not present

## 2019-11-11 DIAGNOSIS — E7849 Other hyperlipidemia: Secondary | ICD-10-CM | POA: Diagnosis not present

## 2019-11-23 DIAGNOSIS — Z87442 Personal history of urinary calculi: Secondary | ICD-10-CM | POA: Diagnosis not present

## 2019-11-23 DIAGNOSIS — Z8042 Family history of malignant neoplasm of prostate: Secondary | ICD-10-CM | POA: Diagnosis not present

## 2019-11-23 DIAGNOSIS — N4883 Acquired buried penis: Secondary | ICD-10-CM | POA: Diagnosis not present

## 2019-11-23 DIAGNOSIS — N35914 Unspecified anterior urethral stricture, male: Secondary | ICD-10-CM | POA: Diagnosis not present

## 2019-12-04 DIAGNOSIS — Z6830 Body mass index (BMI) 30.0-30.9, adult: Secondary | ICD-10-CM | POA: Diagnosis not present

## 2019-12-04 DIAGNOSIS — E6609 Other obesity due to excess calories: Secondary | ICD-10-CM | POA: Diagnosis not present

## 2019-12-04 DIAGNOSIS — Z0000 Encounter for general adult medical examination without abnormal findings: Secondary | ICD-10-CM | POA: Diagnosis not present

## 2019-12-04 DIAGNOSIS — Z1331 Encounter for screening for depression: Secondary | ICD-10-CM | POA: Diagnosis not present

## 2019-12-12 DIAGNOSIS — I10 Essential (primary) hypertension: Secondary | ICD-10-CM | POA: Diagnosis not present

## 2019-12-12 DIAGNOSIS — E7849 Other hyperlipidemia: Secondary | ICD-10-CM | POA: Diagnosis not present

## 2019-12-12 DIAGNOSIS — K219 Gastro-esophageal reflux disease without esophagitis: Secondary | ICD-10-CM | POA: Diagnosis not present

## 2019-12-12 DIAGNOSIS — E1140 Type 2 diabetes mellitus with diabetic neuropathy, unspecified: Secondary | ICD-10-CM | POA: Diagnosis not present

## 2019-12-19 DIAGNOSIS — L304 Erythema intertrigo: Secondary | ICD-10-CM | POA: Diagnosis not present

## 2019-12-19 DIAGNOSIS — L570 Actinic keratosis: Secondary | ICD-10-CM | POA: Diagnosis not present

## 2019-12-19 DIAGNOSIS — L814 Other melanin hyperpigmentation: Secondary | ICD-10-CM | POA: Diagnosis not present

## 2019-12-19 DIAGNOSIS — Z85828 Personal history of other malignant neoplasm of skin: Secondary | ICD-10-CM | POA: Diagnosis not present

## 2020-02-06 DIAGNOSIS — U071 COVID-19: Secondary | ICD-10-CM | POA: Diagnosis not present

## 2020-02-12 DIAGNOSIS — K219 Gastro-esophageal reflux disease without esophagitis: Secondary | ICD-10-CM | POA: Diagnosis not present

## 2020-02-12 DIAGNOSIS — E782 Mixed hyperlipidemia: Secondary | ICD-10-CM | POA: Diagnosis not present

## 2020-02-12 DIAGNOSIS — I10 Essential (primary) hypertension: Secondary | ICD-10-CM | POA: Diagnosis not present

## 2020-02-12 DIAGNOSIS — E1140 Type 2 diabetes mellitus with diabetic neuropathy, unspecified: Secondary | ICD-10-CM | POA: Diagnosis not present

## 2020-02-27 DIAGNOSIS — Z6831 Body mass index (BMI) 31.0-31.9, adult: Secondary | ICD-10-CM | POA: Diagnosis not present

## 2020-02-27 DIAGNOSIS — I10 Essential (primary) hypertension: Secondary | ICD-10-CM | POA: Diagnosis not present

## 2020-02-27 DIAGNOSIS — Z125 Encounter for screening for malignant neoplasm of prostate: Secondary | ICD-10-CM | POA: Diagnosis not present

## 2020-02-27 DIAGNOSIS — E6609 Other obesity due to excess calories: Secondary | ICD-10-CM | POA: Diagnosis not present

## 2020-02-27 DIAGNOSIS — R978 Other abnormal tumor markers: Secondary | ICD-10-CM | POA: Diagnosis not present

## 2020-02-27 DIAGNOSIS — E118 Type 2 diabetes mellitus with unspecified complications: Secondary | ICD-10-CM | POA: Diagnosis not present

## 2020-02-27 DIAGNOSIS — E7849 Other hyperlipidemia: Secondary | ICD-10-CM | POA: Diagnosis not present

## 2020-03-09 DIAGNOSIS — R6889 Other general symptoms and signs: Secondary | ICD-10-CM | POA: Diagnosis not present

## 2020-03-11 DIAGNOSIS — K219 Gastro-esophageal reflux disease without esophagitis: Secondary | ICD-10-CM | POA: Diagnosis not present

## 2020-03-11 DIAGNOSIS — E1140 Type 2 diabetes mellitus with diabetic neuropathy, unspecified: Secondary | ICD-10-CM | POA: Diagnosis not present

## 2020-03-11 DIAGNOSIS — I10 Essential (primary) hypertension: Secondary | ICD-10-CM | POA: Diagnosis not present

## 2020-03-11 DIAGNOSIS — E7849 Other hyperlipidemia: Secondary | ICD-10-CM | POA: Diagnosis not present

## 2020-03-19 ENCOUNTER — Other Ambulatory Visit: Payer: Self-pay | Admitting: Family Medicine

## 2020-03-19 ENCOUNTER — Other Ambulatory Visit (HOSPITAL_COMMUNITY): Payer: Self-pay | Admitting: Family Medicine

## 2020-03-19 DIAGNOSIS — F17210 Nicotine dependence, cigarettes, uncomplicated: Secondary | ICD-10-CM

## 2020-04-10 DIAGNOSIS — I10 Essential (primary) hypertension: Secondary | ICD-10-CM | POA: Diagnosis not present

## 2020-04-10 DIAGNOSIS — E7849 Other hyperlipidemia: Secondary | ICD-10-CM | POA: Diagnosis not present

## 2020-04-10 DIAGNOSIS — E1140 Type 2 diabetes mellitus with diabetic neuropathy, unspecified: Secondary | ICD-10-CM | POA: Diagnosis not present

## 2020-04-10 DIAGNOSIS — K219 Gastro-esophageal reflux disease without esophagitis: Secondary | ICD-10-CM | POA: Diagnosis not present

## 2020-04-15 ENCOUNTER — Ambulatory Visit (HOSPITAL_COMMUNITY)
Admission: RE | Admit: 2020-04-15 | Discharge: 2020-04-15 | Disposition: A | Discharge status: Home / Self Care | Payer: Medicare Other | Source: Ambulatory Visit | Attending: Family Medicine | Admitting: Family Medicine

## 2020-04-15 ENCOUNTER — Other Ambulatory Visit: Payer: Self-pay

## 2020-04-15 DIAGNOSIS — Z87891 Personal history of nicotine dependence: Secondary | ICD-10-CM | POA: Diagnosis not present

## 2020-04-15 DIAGNOSIS — F17210 Nicotine dependence, cigarettes, uncomplicated: Secondary | ICD-10-CM | POA: Insufficient documentation

## 2020-05-11 DIAGNOSIS — K219 Gastro-esophageal reflux disease without esophagitis: Secondary | ICD-10-CM | POA: Diagnosis not present

## 2020-05-11 DIAGNOSIS — I10 Essential (primary) hypertension: Secondary | ICD-10-CM | POA: Diagnosis not present

## 2020-05-11 DIAGNOSIS — E7849 Other hyperlipidemia: Secondary | ICD-10-CM | POA: Diagnosis not present

## 2020-05-11 DIAGNOSIS — E1140 Type 2 diabetes mellitus with diabetic neuropathy, unspecified: Secondary | ICD-10-CM | POA: Diagnosis not present

## 2020-07-11 DIAGNOSIS — E782 Mixed hyperlipidemia: Secondary | ICD-10-CM | POA: Diagnosis not present

## 2020-07-11 DIAGNOSIS — K219 Gastro-esophageal reflux disease without esophagitis: Secondary | ICD-10-CM | POA: Diagnosis not present

## 2020-07-11 DIAGNOSIS — I10 Essential (primary) hypertension: Secondary | ICD-10-CM | POA: Diagnosis not present

## 2020-07-11 DIAGNOSIS — E1140 Type 2 diabetes mellitus with diabetic neuropathy, unspecified: Secondary | ICD-10-CM | POA: Diagnosis not present

## 2020-07-24 DIAGNOSIS — Z23 Encounter for immunization: Secondary | ICD-10-CM | POA: Diagnosis not present

## 2020-08-14 ENCOUNTER — Other Ambulatory Visit: Payer: Self-pay

## 2020-08-14 ENCOUNTER — Encounter (HOSPITAL_COMMUNITY): Payer: Self-pay | Admitting: *Deleted

## 2020-08-14 ENCOUNTER — Inpatient Hospital Stay (HOSPITAL_COMMUNITY)
Admission: EM | Admit: 2020-08-14 | Discharge: 2020-08-16 | DRG: 377 | Disposition: A | Discharge status: Home / Self Care | Payer: Medicare Other | Attending: Internal Medicine | Admitting: Internal Medicine

## 2020-08-14 DIAGNOSIS — I10 Essential (primary) hypertension: Secondary | ICD-10-CM | POA: Diagnosis present

## 2020-08-14 DIAGNOSIS — U071 COVID-19: Secondary | ICD-10-CM | POA: Diagnosis present

## 2020-08-14 DIAGNOSIS — Z823 Family history of stroke: Secondary | ICD-10-CM

## 2020-08-14 DIAGNOSIS — N1831 Chronic kidney disease, stage 3a: Secondary | ICD-10-CM | POA: Diagnosis not present

## 2020-08-14 DIAGNOSIS — D62 Acute posthemorrhagic anemia: Secondary | ICD-10-CM | POA: Diagnosis present

## 2020-08-14 DIAGNOSIS — N183 Chronic kidney disease, stage 3 unspecified: Secondary | ICD-10-CM | POA: Diagnosis present

## 2020-08-14 DIAGNOSIS — E7800 Pure hypercholesterolemia, unspecified: Secondary | ICD-10-CM | POA: Diagnosis present

## 2020-08-14 DIAGNOSIS — K625 Hemorrhage of anus and rectum: Secondary | ICD-10-CM | POA: Diagnosis not present

## 2020-08-14 DIAGNOSIS — Z7984 Long term (current) use of oral hypoglycemic drugs: Secondary | ICD-10-CM

## 2020-08-14 DIAGNOSIS — K5730 Diverticulosis of large intestine without perforation or abscess without bleeding: Secondary | ICD-10-CM | POA: Diagnosis present

## 2020-08-14 DIAGNOSIS — K449 Diaphragmatic hernia without obstruction or gangrene: Secondary | ICD-10-CM | POA: Diagnosis present

## 2020-08-14 DIAGNOSIS — K219 Gastro-esophageal reflux disease without esophagitis: Secondary | ICD-10-CM | POA: Diagnosis present

## 2020-08-14 DIAGNOSIS — Z6830 Body mass index (BMI) 30.0-30.9, adult: Secondary | ICD-10-CM

## 2020-08-14 DIAGNOSIS — E1165 Type 2 diabetes mellitus with hyperglycemia: Secondary | ICD-10-CM | POA: Diagnosis present

## 2020-08-14 DIAGNOSIS — K648 Other hemorrhoids: Secondary | ICD-10-CM | POA: Diagnosis present

## 2020-08-14 DIAGNOSIS — I129 Hypertensive chronic kidney disease with stage 1 through stage 4 chronic kidney disease, or unspecified chronic kidney disease: Secondary | ICD-10-CM | POA: Diagnosis not present

## 2020-08-14 DIAGNOSIS — Z7902 Long term (current) use of antithrombotics/antiplatelets: Secondary | ICD-10-CM

## 2020-08-14 DIAGNOSIS — K921 Melena: Principal | ICD-10-CM

## 2020-08-14 DIAGNOSIS — Z79899 Other long term (current) drug therapy: Secondary | ICD-10-CM

## 2020-08-14 DIAGNOSIS — E6609 Other obesity due to excess calories: Secondary | ICD-10-CM

## 2020-08-14 DIAGNOSIS — Z800 Family history of malignant neoplasm of digestive organs: Secondary | ICD-10-CM

## 2020-08-14 DIAGNOSIS — Z8673 Personal history of transient ischemic attack (TIA), and cerebral infarction without residual deficits: Secondary | ICD-10-CM

## 2020-08-14 DIAGNOSIS — Z87891 Personal history of nicotine dependence: Secondary | ICD-10-CM

## 2020-08-14 DIAGNOSIS — E1122 Type 2 diabetes mellitus with diabetic chronic kidney disease: Secondary | ICD-10-CM | POA: Diagnosis not present

## 2020-08-14 DIAGNOSIS — E663 Overweight: Secondary | ICD-10-CM | POA: Diagnosis present

## 2020-08-14 HISTORY — DX: Type 2 diabetes mellitus without complications: E11.9

## 2020-08-14 LAB — TYPE AND SCREEN
ABO/RH(D): O POS
Antibody Screen: NEGATIVE

## 2020-08-14 LAB — COMPREHENSIVE METABOLIC PANEL
ALT: 20 U/L (ref 0–44)
AST: 20 U/L (ref 15–41)
Albumin: 4 g/dL (ref 3.5–5.0)
Alkaline Phosphatase: 50 U/L (ref 38–126)
Anion gap: 8 (ref 5–15)
BUN: 21 mg/dL (ref 8–23)
CO2: 24 mmol/L (ref 22–32)
Calcium: 9.3 mg/dL (ref 8.9–10.3)
Chloride: 106 mmol/L (ref 98–111)
Creatinine, Ser: 1.41 mg/dL — ABNORMAL HIGH (ref 0.61–1.24)
GFR, Estimated: 53 mL/min — ABNORMAL LOW (ref >60)
Glucose, Bld: 281 mg/dL — ABNORMAL HIGH (ref 70–99)
Potassium: 4.3 mmol/L (ref 3.5–5.1)
Sodium: 138 mmol/L (ref 135–145)
Total Bilirubin: 0.7 mg/dL (ref 0.3–1.2)
Total Protein: 6.9 g/dL (ref 6.5–8.1)

## 2020-08-14 LAB — HEMOGLOBIN: Hemoglobin: 8.7 g/dL — ABNORMAL LOW (ref 13.0–17.0)

## 2020-08-14 LAB — CBC
HCT: 30.1 % — ABNORMAL LOW (ref 39.0–52.0)
Hemoglobin: 9.6 g/dL — ABNORMAL LOW (ref 13.0–17.0)
MCH: 29.5 pg (ref 26.0–34.0)
MCHC: 31.9 g/dL (ref 30.0–36.0)
MCV: 92.6 fL (ref 80.0–100.0)
Platelets: 255 10*3/uL (ref 150–400)
RBC: 3.25 MIL/uL — ABNORMAL LOW (ref 4.22–5.81)
RDW: 15.9 % — ABNORMAL HIGH (ref 11.5–15.5)
WBC: 12.8 10*3/uL — ABNORMAL HIGH (ref 4.0–10.5)
nRBC: 0 % (ref 0.0–0.2)

## 2020-08-14 LAB — POC OCCULT BLOOD, ED: Fecal Occult Bld: POSITIVE — AB

## 2020-08-14 MED ORDER — LACTATED RINGERS IV SOLN: INTRAVENOUS | Status: DC

## 2020-08-14 MED ORDER — PANTOPRAZOLE SODIUM 40 MG IV SOLR
40.0000 mg | Freq: Once | INTRAVENOUS | Status: AC
  Administered 2020-08-14: 40 mg via INTRAVENOUS
  Filled 2020-08-14: qty 40

## 2020-08-14 MED ORDER — ACETAMINOPHEN 325 MG PO TABS: 650.0000 mg | ORAL_TABLET | Freq: Four times a day (QID) | ORAL | Status: DC | PRN

## 2020-08-14 MED ORDER — SODIUM CHLORIDE 0.9 % IV BOLUS
1000.0000 mL | Freq: Once | INTRAVENOUS | Status: AC
  Administered 2020-08-14: 1000 mL via INTRAVENOUS

## 2020-08-14 MED ORDER — PANTOPRAZOLE SODIUM 40 MG IV SOLR
40.0000 mg | Freq: Two times a day (BID) | INTRAVENOUS | Status: DC
  Administered 2020-08-15 – 2020-08-16 (×3): 40 mg via INTRAVENOUS
  Filled 2020-08-14 (×3): qty 40

## 2020-08-14 MED ORDER — ACETAMINOPHEN 650 MG RE SUPP: 650.0000 mg | Freq: Four times a day (QID) | RECTAL | Status: DC | PRN

## 2020-08-14 MED ORDER — ONDANSETRON HCL 4 MG/2ML IJ SOLN: 4.0000 mg | Freq: Four times a day (QID) | INTRAMUSCULAR | Status: DC | PRN

## 2020-08-14 MED ORDER — ONDANSETRON HCL 4 MG PO TABS: 4.0000 mg | ORAL_TABLET | Freq: Four times a day (QID) | ORAL | Status: DC | PRN

## 2020-08-14 NOTE — ED Triage Notes (Signed)
On plavix currently, CVA in 2006

## 2020-08-14 NOTE — ED Triage Notes (Signed)
Pt states black stool few days ago and today noted stools with blood that was bright red in color. Pt with hx of same and diverticulitis in the past.

## 2020-08-14 NOTE — ED Provider Notes (Addendum)
[facility], [place] EMERGENCY DEPARTMENT Provider Note   CSN: [id number] Arrival date & time: 08/14/20  1448     History Chief Complaint  Patient presents with   Rectal Bleeding    David Pruitt is a 75 y.o. male.  Patient states he has been having black stools for 2 to 3 days.  And he had some red blood today.  Patient has a history of bleeding from his diverticulitis twice and his last upper GI was 10 years ago and essentially was negative  The history is provided by the patient and medical records. No language interpreter was used.  Rectal Bleeding Quality:  Black and tarry Duration:  3 days Timing:  Constant Chronicity:  New Context: not anal fissures   Similar prior episodes: no   Relieved by:  Nothing Worsened by:  Nothing Associated symptoms: no abdominal pain   Risk factors: anticoagulant use       Past Medical History:  Diagnosis Date   CVA (cerebral infarction)    2006   Diabetes mellitus ([place])    Diabetes mellitus without complication ([place])    DM (dermatomyositis)    Gastritis    GERD (gastroesophageal reflux disease)    Hemorrhoids    Hypercholesteremia    Hypertension    Lower GI bleed    SECONDARY TO DIVERTICULA,S/P BLEED   Shortness of breath dyspnea     Patient Active Problem List   Diagnosis Date Noted   Gastrointestinal hemorrhage with melena 08/14/2020   Rectal bleeding 10/25/2019   Diverticulitis of colon 10/25/2019   History of GI diverticular bleed 12/07/2018   Lower GI bleed 12/07/2018   B12 deficiency 12/07/2018   Abnormal CT scan, sigmoid colon 12/07/2018   Acute blood loss anemia 12/07/2018   GERD 01/16/2010   GASTROINTESTINAL HEMORRHAGE 01/16/2010    Past Surgical History:  Procedure Laterality Date   COLONOSCOPY  12/2009   RMR: Lower GI bleed due to a single diverticulum status post Endo clipping.  Scattered diverticulosis.   COLONOSCOPY N/A 05/24/2014   Procedure: COLONOSCOPY;  Surgeon: Rogene Houston, MD;  Location: [facility];  Service: Endoscopy;  Laterality: N/A;  1030   COLONOSCOPY N/A 03/15/2019   Diverticulosis, 5 polyps removed.  Pathology revealed tubular adenomas.  Next colonoscopy in 3 years.   ESOPHAGOGASTRODUODENOSCOPY  12/2009   RMR: Mild esophagitis, gastritis, duodenitis.  Gastric biopsies negative for H. pylori   POLYPECTOMY  03/15/2019   Procedure: POLYPECTOMY;  Surgeon: Daneil Dolin, MD;  Location: [facility];  Service: Endoscopy;;       Family History  Problem Relation Age of Onset   Colon cancer Father    Stroke Father     Social History   Tobacco Use   Smoking status: Former    Packs/day: 1.00    Years: 40.00    Pack years: 40.00    Types: Cigarettes   Smokeless tobacco: Never  Vaping Use   Vaping Use: Never used  Substance Use Topics   Alcohol use: Yes    Comment: occasionally   Drug use: No    Home Medications Prior to Admission medications   Medication Sig Start Date End Date Taking? Authorizing Provider  acetaminophen (TYLENOL) 500 MG tablet Take 1,000 mg by mouth every 6 (six) hours as needed for headache.     [provider]  clopidogrel (PLAVIX) 75 MG tablet Take 75 mg by mouth daily.    [provider]  Coenzyme Q10 (CO Q 10) 100 MG  CAPS Take 100 mg by mouth daily.    [provider]  ezetimibe-simvastatin (VYTORIN) 10-20 MG per tablet Take 1 tablet by mouth daily.    [provider]  glipiZIDE (GLUCOTROL XL) 5 MG 24 hr tablet Take 5 mg by mouth daily.     [provider]  glucosamine-chondroitin 500-400 MG tablet Take 1 tablet by mouth daily.    [provider]  lisinopril (PRINIVIL,ZESTRIL) 10 MG tablet Take 10 mg by mouth daily.    [provider]  loratadine (CLARITIN) 10 MG tablet Take 10 mg daily by mouth.    [provider]  meloxicam (MOBIC) 15 MG tablet Take 15 mg by mouth daily.    [provider]  metoprolol succinate (TOPROL-XL) 50 MG 24 hr tablet Take 50 mg by  mouth daily. 10/23/19   [provider]  Multiple Vitamins-Minerals (MULTIVITAMIN WITH MINERALS) tablet Take 1 tablet by mouth daily.    [provider]  pantoprazole (PROTONIX) 40 MG tablet Take 40 mg by mouth daily.    [provider]  simvastatin (ZOCOR) 20 MG tablet Take 20 mg by mouth at bedtime. 10/14/18   [provider]  sitaGLIPtin-metformin (JANUMET) 50-1000 MG tablet Take 1 tablet 2 (two) times daily with a meal by mouth.    [provider]  vitamin B-12 (CYANOCOBALAMIN) 1000 MCG tablet Take 1,000 mcg daily by mouth.    [provider]    Allergies    Patient has no known allergies.  Review of Systems   Review of Systems  Constitutional:  Negative for appetite change and fatigue.  HENT:  Negative for congestion, ear discharge and sinus pressure.   Eyes:  Negative for discharge.  Respiratory:  Negative for cough.   Cardiovascular:  Negative for chest pain.  Gastrointestinal:  Positive for hematochezia. Negative for abdominal pain and diarrhea.       Black stools  Genitourinary:  Negative for frequency and hematuria.  Musculoskeletal:  Negative for back pain.  Skin:  Negative for rash.  Neurological:  Negative for seizures and headaches.  Psychiatric/Behavioral:  Negative for hallucinations.    Physical Exam Updated Vital Signs BP 140/84   Pulse 90   Temp 98.4 F (36.9 C) (Oral)   Resp 16   Ht '5\' 10"'$  (1.778 m)   Wt 93 kg   SpO2 100%   BMI 29.41 kg/m   Physical Exam Vitals and nursing note reviewed.  Constitutional:      Appearance: He is well-developed.  HENT:     Head: Normocephalic.     Nose: Nose normal.  Eyes:     General: No scleral icterus.    Conjunctiva/sclera: Conjunctivae normal.  Neck:     Thyroid: No thyromegaly.  Cardiovascular:     Rate and Rhythm: Normal rate and regular rhythm.     Heart sounds: No murmur heard.   No friction rub. No gallop.  Pulmonary:     Breath sounds: No stridor.  No wheezing or rales.  Chest:     Chest wall: No tenderness.  Abdominal:     General: There is no distension.     Tenderness: There is no abdominal tenderness. There is no rebound.  Genitourinary:    Comments: Brown stool heme positive Musculoskeletal:        General: Normal range of motion.     Cervical back: Neck supple.  Lymphadenopathy:     Cervical: No cervical adenopathy.  Skin:    Findings: No erythema or  rash.  Neurological:     Mental Status: He is alert and oriented to person, place, and time.     Motor: No abnormal muscle tone.     Coordination: Coordination normal.  Psychiatric:        Behavior: Behavior normal.    ED Results / Procedures / Treatments   Labs (all labs ordered are listed, but only abnormal results are displayed) Labs Reviewed  COMPREHENSIVE METABOLIC PANEL - Abnormal; Notable for the following components:      Result Value   Glucose, Bld 281 (*)    Creatinine, Ser 1.41 (*)    GFR, Estimated 53 (*)    All other components within normal limits  CBC - Abnormal; Notable for the following components:   WBC 12.8 (*)    RBC 3.25 (*)    Hemoglobin 9.6 (*)    HCT 30.1 (*)    RDW 15.9 (*)    All other components within normal limits  POC OCCULT BLOOD, ED - Abnormal; Notable for the following components:   Fecal Occult Bld POSITIVE (*)    All other components within normal limits  HEMOGLOBIN  COMPREHENSIVE METABOLIC PANEL  TYPE AND SCREEN    EKG None  Radiology No results found.  Procedures Procedures   Medications Ordered in ED Medications  pantoprazole (PROTONIX) injection 40 mg (has no administration in time range)  acetaminophen (TYLENOL) tablet 650 mg (has no administration in time range)    Or  acetaminophen (TYLENOL) suppository 650 mg (has no administration in time range)  ondansetron (ZOFRAN) tablet 4 mg (has no administration in time range)    Or  ondansetron (ZOFRAN) injection 4 mg (has no administration in time range)   lactated ringers infusion (has no administration in time range)  sodium chloride 0.9 % bolus 1,000 mL (1,000 mLs Intravenous New Bag/Given 08/14/20 2121)  pantoprazole (PROTONIX) injection 40 mg (40 mg Intravenous Given 08/14/20 2122)    ED Course  I have reviewed the triage vital signs and the nursing notes.  Pertinent labs & imaging results that were available during my care of the patient were reviewed by me and considered in my medical decision making (see chart for details).    MDM Rules/Calculators/A&P                          Patient with upper GI bleed.  He will be placed on protonic and admitted by medicine with GI consult  Final Clinical Impression(s) / ED Diagnoses Final diagnoses:  Rectal bleeding    Rx / DC Orders ED Discharge Orders     None        Milton Ferguson, MD 08/14/20 2254    Milton Ferguson, MD 08/14/20 2254

## 2020-08-15 ENCOUNTER — Encounter (HOSPITAL_COMMUNITY)
Admission: EM | Disposition: A | Discharge status: Home / Self Care | Payer: Self-pay | Source: Home / Self Care | Attending: Internal Medicine

## 2020-08-15 ENCOUNTER — Encounter (HOSPITAL_COMMUNITY): Payer: Self-pay | Admitting: Internal Medicine

## 2020-08-15 ENCOUNTER — Observation Stay (HOSPITAL_COMMUNITY): Payer: Medicare Other | Admitting: Anesthesiology

## 2020-08-15 ENCOUNTER — Other Ambulatory Visit: Payer: Self-pay

## 2020-08-15 DIAGNOSIS — N1831 Chronic kidney disease, stage 3a: Secondary | ICD-10-CM | POA: Diagnosis not present

## 2020-08-15 DIAGNOSIS — K2100 Gastro-esophageal reflux disease with esophagitis, without bleeding: Secondary | ICD-10-CM | POA: Diagnosis not present

## 2020-08-15 DIAGNOSIS — N183 Chronic kidney disease, stage 3 unspecified: Secondary | ICD-10-CM | POA: Diagnosis present

## 2020-08-15 DIAGNOSIS — Z87891 Personal history of nicotine dependence: Secondary | ICD-10-CM | POA: Diagnosis not present

## 2020-08-15 DIAGNOSIS — K219 Gastro-esophageal reflux disease without esophagitis: Secondary | ICD-10-CM | POA: Diagnosis present

## 2020-08-15 DIAGNOSIS — D62 Acute posthemorrhagic anemia: Secondary | ICD-10-CM | POA: Diagnosis not present

## 2020-08-15 DIAGNOSIS — Z800 Family history of malignant neoplasm of digestive organs: Secondary | ICD-10-CM | POA: Diagnosis not present

## 2020-08-15 DIAGNOSIS — K625 Hemorrhage of anus and rectum: Secondary | ICD-10-CM | POA: Diagnosis not present

## 2020-08-15 DIAGNOSIS — E1122 Type 2 diabetes mellitus with diabetic chronic kidney disease: Secondary | ICD-10-CM | POA: Diagnosis not present

## 2020-08-15 DIAGNOSIS — Z6830 Body mass index (BMI) 30.0-30.9, adult: Secondary | ICD-10-CM | POA: Diagnosis not present

## 2020-08-15 DIAGNOSIS — K449 Diaphragmatic hernia without obstruction or gangrene: Secondary | ICD-10-CM | POA: Diagnosis present

## 2020-08-15 DIAGNOSIS — Z7984 Long term (current) use of oral hypoglycemic drugs: Secondary | ICD-10-CM | POA: Diagnosis not present

## 2020-08-15 DIAGNOSIS — K648 Other hemorrhoids: Secondary | ICD-10-CM | POA: Diagnosis present

## 2020-08-15 DIAGNOSIS — E1165 Type 2 diabetes mellitus with hyperglycemia: Secondary | ICD-10-CM | POA: Diagnosis present

## 2020-08-15 DIAGNOSIS — Z823 Family history of stroke: Secondary | ICD-10-CM | POA: Diagnosis not present

## 2020-08-15 DIAGNOSIS — E663 Overweight: Secondary | ICD-10-CM | POA: Diagnosis present

## 2020-08-15 DIAGNOSIS — E7800 Pure hypercholesterolemia, unspecified: Secondary | ICD-10-CM | POA: Diagnosis present

## 2020-08-15 DIAGNOSIS — K5730 Diverticulosis of large intestine without perforation or abscess without bleeding: Secondary | ICD-10-CM | POA: Diagnosis present

## 2020-08-15 DIAGNOSIS — Z79899 Other long term (current) drug therapy: Secondary | ICD-10-CM | POA: Diagnosis not present

## 2020-08-15 DIAGNOSIS — Z8673 Personal history of transient ischemic attack (TIA), and cerebral infarction without residual deficits: Secondary | ICD-10-CM | POA: Diagnosis not present

## 2020-08-15 DIAGNOSIS — G4730 Sleep apnea, unspecified: Secondary | ICD-10-CM | POA: Diagnosis not present

## 2020-08-15 DIAGNOSIS — I10 Essential (primary) hypertension: Secondary | ICD-10-CM | POA: Diagnosis present

## 2020-08-15 DIAGNOSIS — Z7902 Long term (current) use of antithrombotics/antiplatelets: Secondary | ICD-10-CM | POA: Diagnosis not present

## 2020-08-15 DIAGNOSIS — U071 COVID-19: Secondary | ICD-10-CM | POA: Diagnosis not present

## 2020-08-15 DIAGNOSIS — I129 Hypertensive chronic kidney disease with stage 1 through stage 4 chronic kidney disease, or unspecified chronic kidney disease: Secondary | ICD-10-CM | POA: Diagnosis not present

## 2020-08-15 DIAGNOSIS — K921 Melena: Secondary | ICD-10-CM | POA: Diagnosis not present

## 2020-08-15 DIAGNOSIS — E119 Type 2 diabetes mellitus without complications: Secondary | ICD-10-CM | POA: Insufficient documentation

## 2020-08-15 DIAGNOSIS — E6609 Other obesity due to excess calories: Secondary | ICD-10-CM | POA: Diagnosis not present

## 2020-08-15 HISTORY — PX: ESOPHAGOGASTRODUODENOSCOPY (EGD) WITH PROPOFOL: SHX5813

## 2020-08-15 HISTORY — DX: Chronic kidney disease, stage 3 unspecified: N18.30

## 2020-08-15 HISTORY — DX: Overweight: E66.3

## 2020-08-15 LAB — GLUCOSE, CAPILLARY
Glucose-Capillary: 126 mg/dL — ABNORMAL HIGH (ref 70–99)
Glucose-Capillary: 160 mg/dL — ABNORMAL HIGH (ref 70–99)
Glucose-Capillary: 199 mg/dL — ABNORMAL HIGH (ref 70–99)

## 2020-08-15 LAB — COMPREHENSIVE METABOLIC PANEL
ALT: 18 U/L (ref 0–44)
AST: 17 U/L (ref 15–41)
Albumin: 3.6 g/dL (ref 3.5–5.0)
Alkaline Phosphatase: 41 U/L (ref 38–126)
Anion gap: 6 (ref 5–15)
BUN: 17 mg/dL (ref 8–23)
CO2: 25 mmol/L (ref 22–32)
Calcium: 8.8 mg/dL — ABNORMAL LOW (ref 8.9–10.3)
Chloride: 110 mmol/L (ref 98–111)
Creatinine, Ser: 1.2 mg/dL (ref 0.61–1.24)
GFR, Estimated: >60 mL/min (ref >60)
Glucose, Bld: 129 mg/dL — ABNORMAL HIGH (ref 70–99)
Potassium: 4.4 mmol/L (ref 3.5–5.1)
Sodium: 141 mmol/L (ref 135–145)
Total Bilirubin: 0.6 mg/dL (ref 0.3–1.2)
Total Protein: 5.9 g/dL — ABNORMAL LOW (ref 6.5–8.1)

## 2020-08-15 LAB — SURGICAL PCR SCREEN
MRSA, PCR: NEGATIVE
Staphylococcus aureus: NEGATIVE

## 2020-08-15 LAB — HEMOGLOBIN A1C
Hgb A1c MFr Bld: 9 % — ABNORMAL HIGH (ref 4.8–5.6)
Mean Plasma Glucose: 211.6 mg/dL

## 2020-08-15 LAB — CBC
HCT: 27.2 % — ABNORMAL LOW (ref 39.0–52.0)
Hemoglobin: 8.4 g/dL — ABNORMAL LOW (ref 13.0–17.0)
MCH: 29.2 pg (ref 26.0–34.0)
MCHC: 30.9 g/dL (ref 30.0–36.0)
MCV: 94.4 fL (ref 80.0–100.0)
Platelets: 209 10*3/uL (ref 150–400)
RBC: 2.88 MIL/uL — ABNORMAL LOW (ref 4.22–5.81)
RDW: 15.9 % — ABNORMAL HIGH (ref 11.5–15.5)
WBC: 9.6 10*3/uL (ref 4.0–10.5)
nRBC: 0 % (ref 0.0–0.2)

## 2020-08-15 LAB — RESP PANEL BY RT-PCR (FLU A&B, COVID) ARPGX2
Influenza A by PCR: NEGATIVE
Influenza B by PCR: NEGATIVE
SARS Coronavirus 2 by RT PCR: POSITIVE — AB

## 2020-08-15 SURGERY — ESOPHAGOGASTRODUODENOSCOPY (EGD) WITH PROPOFOL
Anesthesia: General

## 2020-08-15 MED ORDER — ALBUTEROL SULFATE HFA 108 (90 BASE) MCG/ACT IN AERS: 2.0000 | INHALATION_SPRAY | Freq: Once | RESPIRATORY_TRACT | Status: AC | PRN

## 2020-08-15 MED ORDER — MUPIROCIN 2 % EX OINT
1.0000 | TOPICAL_OINTMENT | Freq: Two times a day (BID) | CUTANEOUS | Status: DC
Start: 2020-08-15 — End: 2020-08-16
  Administered 2020-08-16: 1 via NASAL
  Filled 2020-08-15: qty 22

## 2020-08-15 MED ORDER — PROPOFOL 10 MG/ML IV BOLUS
INTRAVENOUS | Status: DC | PRN
  Administered 2020-08-15: 100 mg via INTRAVENOUS

## 2020-08-15 MED ORDER — BISACODYL 5 MG PO TBEC
10.0000 mg | DELAYED_RELEASE_TABLET | Freq: Once | ORAL | Status: AC
  Administered 2020-08-15: 10 mg via ORAL
  Filled 2020-08-15: qty 2

## 2020-08-15 MED ORDER — LACTATED RINGERS IV SOLN: INTRAVENOUS | Status: DC | PRN

## 2020-08-15 MED ORDER — FAMOTIDINE IN NACL 20-0.9 MG/50ML-% IV SOLN: 20.0000 mg | Freq: Once | INTRAVENOUS | Status: AC | PRN

## 2020-08-15 MED ORDER — DIPHENHYDRAMINE HCL 50 MG/ML IJ SOLN: 50.0000 mg | Freq: Once | INTRAMUSCULAR | Status: AC | PRN

## 2020-08-15 MED ORDER — LIDOCAINE HCL (CARDIAC) PF 100 MG/5ML IV SOSY
PREFILLED_SYRINGE | INTRAVENOUS | Status: DC | PRN
  Administered 2020-08-15: 100 mg via INTRAVENOUS

## 2020-08-15 MED ORDER — SODIUM CHLORIDE 0.9 % IV SOLN: INTRAVENOUS | Status: AC | PRN

## 2020-08-15 MED ORDER — METHYLPREDNISOLONE SODIUM SUCC 125 MG IJ SOLR: 125.0000 mg | Freq: Once | INTRAMUSCULAR | Status: AC | PRN

## 2020-08-15 MED ORDER — PHENYLEPHRINE 40 MCG/ML (10ML) SYRINGE FOR IV PUSH (FOR BLOOD PRESSURE SUPPORT)
PREFILLED_SYRINGE | INTRAVENOUS | Status: AC
  Filled 2020-08-15: qty 10

## 2020-08-15 MED ORDER — BEBTELOVIMAB 175 MG/2 ML IV (EUA)
175.0000 mg | Freq: Once | INTRAMUSCULAR | Status: DC
  Filled 2020-08-15: qty 2

## 2020-08-15 MED ORDER — PEG 3350-KCL-NABCB-NACL-NASULF 236 G PO SOLR
4000.0000 mL | Freq: Once | ORAL | Status: AC
  Administered 2020-08-15: 4000 mL via ORAL
  Filled 2020-08-15: qty 4000

## 2020-08-15 MED ORDER — INSULIN ASPART 100 UNIT/ML IJ SOLN: 0.0000 [IU] | Freq: Every day | INTRAMUSCULAR | Status: DC

## 2020-08-15 MED ORDER — INSULIN ASPART 100 UNIT/ML IJ SOLN
0.0000 [IU] | Freq: Three times a day (TID) | INTRAMUSCULAR | Status: DC
  Administered 2020-08-16: 3 [IU] via SUBCUTANEOUS
  Administered 2020-08-16: 2 [IU] via SUBCUTANEOUS

## 2020-08-15 MED ORDER — PROPOFOL 500 MG/50ML IV EMUL
INTRAVENOUS | Status: DC | PRN
  Administered 2020-08-15: 100 ug/kg/min via INTRAVENOUS
  Administered 2020-08-15: 200 ug/kg/min via INTRAVENOUS

## 2020-08-15 MED ORDER — EPINEPHRINE 0.3 MG/0.3ML IJ SOAJ
0.3000 mg | Freq: Once | INTRAMUSCULAR | Status: AC | PRN
  Filled 2020-08-15: qty 0.6

## 2020-08-15 NOTE — Op Note (Signed)
[facility] Patient Name: David Pruitt Procedure Date: 08/15/2020 3:26 PM MRN: [id number] Date of Birth: 06-03-1945 Attending MD: Norvel Richards , MD CSN: [id number] Age: 75 Admit Type: Inpatient Procedure:                Upper GI endoscopy Indications:              Melena Providers:                Norvel Richards, MD, Janeece Riggers, RN, Randa Spike, Technician Referring MD:              Medicines:                Propofol per Anesthesia Complications:            No immediate complications. Estimated Blood Loss:     Estimated blood loss: none. Procedure:                Pre-Anesthesia Assessment:                           - Prior to the procedure, a History and Physical                            was performed, and patient medications and                            allergies were reviewed. The patient's tolerance of                            previous anesthesia was also reviewed. The risks                            and benefits of the procedure and the sedation                            options and risks were discussed with the patient.                            All questions were answered, and informed consent                            was obtained. Prior Anticoagulants: The patient has                            taken no previous anticoagulant or antiplatelet                            agents. ASA Grade Assessment: III - A patient with                            severe systemic disease. After reviewing the risks  and benefits, the patient was deemed in                            satisfactory condition to undergo the procedure.                           After obtaining informed consent, the endoscope was                            passed under direct vision. Throughout the                            procedure, the patient's blood pressure, pulse, and                            oxygen saturations were  monitored continuously. The                            [id number] scope was introduced through the                            mouth, and advanced to the third part of duodenum.                            The upper GI endoscopy was accomplished without                            difficulty. The patient tolerated the procedure                            well. Scope In: 3:44:31 PM Scope Out: 3:50:03 PM Total Procedure Duration: 0 hours 5 minutes 32 seconds  Findings:      The examined esophagus was normal aside from a 2 cm skinny tongue of       salmon-colored epithelium which was not biopsied today..      A small hiatal hernia was present.      The exam was otherwise without abnormality.      The duodenal bulb, second portion of the duodenum and third portion of       the duodenum were normal. Impression:               - Possible short segment Barrett's esophagus?"not                            biopsied today in the setting of acute bleeding..                           - Small hiatal hernia.                           - The examination was otherwise normal.                           - Normal duodenal bulb, second portion of the  duodenum and third portion of the duodenum.                           - No specimens collected. No blood or bleeding                            lesion found in the upper GI tract. Given                            significant decline in hemoglobin, further                            evaluation is warranted. Moderate Sedation:      Moderate (conscious) sedation was personally administered by an       anesthesia professional. The following parameters were monitored: oxygen       saturation, heart rate, blood pressure, respiratory rate, EKG, adequacy       of pulmonary ventilation, and response to care. Recommendation:           - Patient has a contact number available for                            emergencies. The signs and  symptoms of potential                            delayed complications were discussed with the                            patient. Return to normal activities tomorrow.                            Written discharge instructions were provided to the                            patient.                           Clear liquid diet. Trend H&H. I have offered the                            patient a diagnostic colonoscopy tomorrow. Risks                            benefits, limitations have been reviewed with                            patient and patient's wife, David Pruitt [phone]).                            Questions answered. All parties agreeable. We will                            plan for colonoscopy tomorrow. Procedure Code(s):        --- Professional ---  [id number], Esophagogastroduodenoscopy, flexible,                            transoral; diagnostic, including collection of                            specimen(s) by brushing or washing, when performed                            (separate procedure) Diagnosis Code(s):        --- Professional ---                           K44.9, Diaphragmatic hernia without obstruction or                            gangrene                           K92.1, Melena (includes Hematochezia) CPT copyright 2019 American Medical Association. All rights reserved. The codes documented in this report are preliminary and upon coder review may  be revised to meet current compliance requirements. Cristopher Estimable. Helmi Hechavarria, MD Norvel Richards, MD 08/15/2020 5:00:21 PM This report has been signed electronically. Number of Addenda: 0

## 2020-08-15 NOTE — Anesthesia Preprocedure Evaluation (Addendum)
Anesthesia Evaluation  Patient identified by MRN, date of birth, ID band Patient awake    Reviewed: Allergy & Precautions, NPO status , Patient's Chart, lab work & pertinent test results, reviewed documented beta blocker date and time   History of Anesthesia Complications Negative for: history of anesthetic complications  Airway Mallampati: II  TM Distance: >3 FB Neck ROM: Full    Dental  (+) Dental Advisory Given, Missing, Partial Lower   Pulmonary shortness of breath, sleep apnea and Continuous Positive Airway Pressure Ventilation , former smoker,    Pulmonary exam normal breath sounds clear to auscultation       Cardiovascular Exercise Tolerance: Good hypertension, Pt. on medications and Pt. on home beta blockers Normal cardiovascular exam Rhythm:Regular Rate:Normal     Neuro/Psych  Neuromuscular disease CVA    GI/Hepatic GERD  Medicated and Controlled,  Endo/Other  diabetes, Well Controlled, Type 2, Oral Hypoglycemic Agents  Renal/GU Renal InsufficiencyRenal disease     Musculoskeletal   Abdominal   Peds  Hematology  (+) anemia ,   Anesthesia Other Findings Covid positive  Reproductive/Obstetrics                            Anesthesia Physical Anesthesia Plan  ASA: 3  Anesthesia Plan: General   Post-op Pain Management:    Induction: Intravenous  PONV Risk Score and Plan: Propofol infusion  Airway Management Planned: Nasal Cannula and Natural Airway  Additional Equipment:   Intra-op Plan:   Post-operative Plan:   Informed Consent: I have reviewed the patients History and Physical, chart, labs and discussed the procedure including the risks, benefits and alternatives for the proposed anesthesia with the patient or authorized representative who has indicated his/her understanding and acceptance.     Dental advisory given  Plan Discussed with: CRNA and  Surgeon  Anesthesia Plan Comments:         Anesthesia Quick Evaluation

## 2020-08-15 NOTE — H&P (Signed)
History and Physical    David Pruitt [id number] DOB: March 14, 1945 DOA: 08/14/2020  PCP: Sharilyn Sites, MD   Patient coming from: Home.   I have personally briefly reviewed patient's old medical records in [place]  Chief Complaint: Melena.  HPI: David Pruitt is a 75 y.o. male with medical history significant of stage IIIa CKD, history of other nonhemorrhagic CVA, dermatomyositis, gastritis, GERD, hemorrhoids, hyperlipidemia, hypertension, type II DM, overweight, history of diverticular bleed who is coming to the emergency department with complaints of several episodes of melena over the last few days and 1 episode of hematochezia earlier today after melanotic stool BM.  Denied abdominal pain, nausea, emesis, diarrhea or constipation.  He has felt weaker than usual, but denied dizziness, dyspnea, chest pain, somnolence, diaphoresis, PND, orthopnea or pitting edema of the lower extremities.  No dysuria, frequency hematuria.  No polyuria, polydipsia, polyphagia or blurred vision.  ED Course: Initial vital signs were temperature 98.4 F, pulse 103, respirations 16, BP 110/66 mmHg O2 sat 98% on room air.  He received a 1000 mL NS bolus and 40 mg of pantoprazole IVP x1.  Lab work: Fecal occult blood was positive.  His CBC showed a white count was 12.8, hemoglobin 9.6 g/dL and platelets 255.  CMP showed a glucose of 281 and creatinine of 1.41 mg/dL.  The rest of the CMP values were normal.  Review of Systems: As per HPI otherwise all other systems reviewed and are negative.  Past Medical History:  Diagnosis Date   CKD (chronic kidney disease), stage IIIa ([place]) 08/15/2020   CVA (cerebral infarction)    2006   DM (dermatomyositis)    Gastritis    GERD (gastroesophageal reflux disease)    Hemorrhoids    Hypercholesteremia    Hypertension    Lower GI bleed    SECONDARY TO DIVERTICULA,S/P BLEED   Overweight (BMI 25.0-29.9) 08/15/2020   Shortness of breath dyspnea    Type 2  diabetes mellitus ([place])    Past Surgical History:  Procedure Laterality Date   COLONOSCOPY  12/2009   RMR: Lower GI bleed due to a single diverticulum status post Endo clipping.  Scattered diverticulosis.   COLONOSCOPY N/A 05/24/2014   Procedure: COLONOSCOPY;  Surgeon: Rogene Houston, MD;  Location: [facility];  Service: Endoscopy;  Laterality: N/A;  1030   COLONOSCOPY N/A 03/15/2019   Diverticulosis, 5 polyps removed.  Pathology revealed tubular adenomas.  Next colonoscopy in 3 years.   ESOPHAGOGASTRODUODENOSCOPY  12/2009   RMR: Mild esophagitis, gastritis, duodenitis.  Gastric biopsies negative for H. pylori   POLYPECTOMY  03/15/2019   Procedure: POLYPECTOMY;  Surgeon: Daneil Dolin, MD;  Location: [facility];  Service: Endoscopy;;   Social History  reports that he has quit smoking. His smoking use included cigarettes. He has a 40.00 pack-year smoking history. He has never used smokeless tobacco. He reports current alcohol use. He reports that he does not use drugs.  No Known Allergies  Family History  Problem Relation Age of Onset   Colon cancer Father    Stroke Father    Prior to Admission medications   Medication Sig Start Date End Date Taking? Authorizing Provider  acetaminophen (TYLENOL) 500 MG tablet Take 1,000 mg by mouth every 6 (six) hours as needed for headache.     [provider]  clopidogrel (PLAVIX) 75 MG tablet Take 75 mg by mouth daily.    [provider]  Coenzyme Q10 (CO Q 10) 100  MG CAPS Take 100 mg by mouth daily.    [provider]  ezetimibe-simvastatin (VYTORIN) 10-20 MG per tablet Take 1 tablet by mouth daily.    [provider]  glipiZIDE (GLUCOTROL XL) 5 MG 24 hr tablet Take 5 mg by mouth daily.     [provider]  glucosamine-chondroitin 500-400 MG tablet Take 1 tablet by mouth daily.    [provider]  lisinopril (PRINIVIL,ZESTRIL) 10 MG tablet Take 10 mg by mouth daily.    [provider]  loratadine (CLARITIN) 10 MG tablet Take 10 mg daily by mouth.    [provider]  meloxicam (MOBIC) 15 MG tablet Take 15 mg by mouth daily.    [provider]  metoprolol succinate (TOPROL-XL) 50 MG 24 hr tablet Take 50 mg by mouth daily. 10/23/19   [provider]  Multiple Vitamins-Minerals (MULTIVITAMIN WITH MINERALS) tablet Take 1 tablet by mouth daily.    [provider]  pantoprazole (PROTONIX) 40 MG tablet Take 40 mg by mouth daily.    [provider]  simvastatin (ZOCOR) 20 MG tablet Take 20 mg by mouth at bedtime. 10/14/18   [provider]  sitaGLIPtin-metformin (JANUMET) 50-1000 MG tablet Take 1 tablet 2 (two) times daily with a meal by mouth.    [provider]  vitamin B-12 (CYANOCOBALAMIN) 1000 MCG tablet Take 1,000 mcg daily by mouth.    [provider]   Physical Exam: Vitals:   08/14/20 1514 08/14/20 1518 08/14/20 2029 08/14/20 2100  BP:  110/66 (!) 156/84 140/84  Pulse:  (!) 103 93 90  Resp:  '16 16 16  '$ Temp:  98.4 F (36.9 C)    TempSrc:  Oral    SpO2:  98% 100% 100%  Weight: 93 kg     Height: '5\' 10"'$  (1.778 m)      Constitutional: NAD, calm, comfortable Eyes: PERRL, lids and conjunctivae are pale. ENMT: Mucous membranes are moist. Posterior pharynx clear of any exudate or lesions. Neck: normal, supple, no masses, no thyromegaly Respiratory: clear to auscultation bilaterally, no wheezing, no crackles. Normal respiratory effort. No accessory muscle use.  Cardiovascular: Regular rate and rhythm, no murmurs / rubs / gallops. No extremity edema. 2+ pedal pulses. No carotid bruits.  Abdomen: Obese, no distention.  Bowel sounds positive.  Soft, no tenderness, no masses palpated. No hepatosplenomegaly.  Rectal exam in an ED with melanotic stools. Musculoskeletal: no clubbing / cyanosis. Good ROM, no contractures. Normal muscle tone.  Skin: no rashes, lesions, ulcers on very limited dermatological  examination. Neurologic: CN 2-12 grossly intact. Sensation intact, DTR normal. Strength 5/5 in all 4.  Psychiatric: Normal judgment and insight. Alert and oriented x 3. Normal mood.   Labs on Admission: I have personally reviewed following labs and imaging studies  CBC: Recent Labs  Lab 08/14/20 1542 08/14/20 2302  WBC 12.8*  --   HGB 9.6* 8.7*  HCT 30.1*  --   MCV 92.6  --   PLT 255  --     Basic Metabolic Panel: Recent Labs  Lab 08/14/20 1542  NA 138  K 4.3  CL 106  CO2 24  GLUCOSE 281*  BUN 21  CREATININE 1.41*  CALCIUM 9.3    GFR: Estimated Creatinine Clearance: 53.5 mL/min (A) (by C-G formula based on SCr of 1.41 mg/dL (H)).  Liver Function Tests: Recent Labs  Lab 08/14/20 1542  AST 20  ALT 20  ALKPHOS 50  BILITOT 0.7  PROT 6.9  ALBUMIN 4.0   Radiological Exams on Admission: No results found.  EKG: Independently reviewed.   Assessment/Plan Principal Problem:   Acute blood loss anemia In the setting of:   Gastrointestinal hemorrhage with melena  Observation/telemetry. Keep NPO. Continue pantoprazole 40 mg IVP q 12 hours. Monitor H&H closely and transfuse as needed. Consult gastroenterology in AM.  Active Problems:   GERD (gastroesophageal reflux disease) On pantoprazole.    Hypertension Continue lisinopril 10 mg p.o. daily. Continue metoprolol succinate 50 mg daily.    Hypercholesteremia Continue statin.    Type 2 diabetes mellitus with hyperglycemia (HCC) Currently NPO. Hold Janumet. CBG monitoring every 6 hours. Check hemoglobin A1c.    CKD (chronic kidney disease), stage IIIa (HCC) Around baseline. Monitor creatinine level and GFR.    Overweight (BMI 25.0-29.9) Needs lifestyle modifications. Follow-up with PCP.     DVT prophylaxis: SCDs. Code Status:   Full code. Family Communication:   Disposition Plan:   Patient is from:  Home.  Anticipated DC to:  Home.  Anticipated DC date:  08/16/2020.  Anticipated DC  barriers: Clinical status/consultant sign off.  Consults called:  Routine gastroenterology consult order placed. Admission status:  Observation/telemetry.  Severity of Illness:  High severity due to presenting with acute blood loss anemia of more than 4 g/dL in the setting of melena several days ago and episodes of hematochezia.  The patient will need to remain in the hospital for close H&H monitoring, transfusion as needed and evaluation by GI for endoscopic studies.  Reubin Milan MD [facility]  How to contact the [facility] Attending or Consulting provider [place] or covering provider during after hours [place], for this patient?   Check the care team in [facility] and look for a) attending/consulting [facility] provider listed and b) the [facility] team listed Log into [email or web address] and use [place]'s universal password to access. If you do not have the password, please contact the hospital operator. Locate the [facility] provider you are looking for under [facility] and page to a number that you can be directly reached. If you still have difficulty reaching the provider, please page the [facility] (Director on Call) for the Hospitalists listed on amion for assistance.  08/15/2020, 12:59 AM   This document was prepared using Dragon voice recognition software and may contain some unintended transcription errors.

## 2020-08-15 NOTE — Anesthesia Postprocedure Evaluation (Signed)
Anesthesia Post Note  Patient: David Pruitt  Procedure(s) Performed: ESOPHAGOGASTRODUODENOSCOPY (EGD) WITH PROPOFOL  Patient location during evaluation: PACU Anesthesia Type: General Level of consciousness: awake and alert and oriented Pain management: pain level controlled Respiratory status: spontaneous breathing and respiratory function stable Cardiovascular status: blood pressure returned to baseline and stable Postop Assessment: no apparent nausea or vomiting Anesthetic complications: no   No notable events documented.   Last Vitals:  Vitals:   08/15/20 1555 08/15/20 1600  BP: 124/64 126/71  Pulse: 88 91  Resp: 18   Temp:  (!) 36.2 C  SpO2:  99%    Last Pain:  Vitals:   08/15/20 1535  TempSrc:   PainSc: 0-No pain                 Jakira Mcfadden C Marguita Venning

## 2020-08-15 NOTE — Progress Notes (Signed)
Patient seen and examined. Admitted after midnight secondary to GI bleed and symptomatic anemia. Patient Hgb down roughly 4 gram in the last year and with evidence of positive FOBT and melanotic stools in ED. Hemodynamically stable. Patient with hx of CKD stage 3a probably also contributing to his anemia. No hypoxic and no significant complaints of SOB; positive COVID PCR appreciated (incidental). Please refer to H&P written by Dr. Olevia Bowens for further info/details on admission.  Plan: -will check CXR -provided infusion with monoclonal antibody for COVID; but declined by patient. He is asymptomatic. -continue isolation precautions  -continue IV PPI and NPO status -will follow recommendations by GI service -follow Hgb trend and transfuse as needed.  Barton Dubois MD [phone]

## 2020-08-15 NOTE — Progress Notes (Signed)
Inpatient Diabetes Program Recommendations  AACE/ADA: New Consensus Statement on Inpatient Glycemic Control   Target Ranges:  Prepandial:   less than 140 mg/dL      Peak postprandial:   less than 180 mg/dL (1-2 hours)      Critically ill patients:  140 - 180 mg/dL   Results for David Pruitt, David Pruitt (MRN [id number]) as of 08/15/2020 11:23  Ref. Range 08/15/2020 04:27  Glucose-Capillary Latest Ref Range: 70 - 99 mg/dL 126 (H)  Results for David Pruitt, David Pruitt (MRN [id number]) as of 08/15/2020 11:23  Ref. Range 08/14/2020 15:42  Glucose Latest Ref Range: 70 - 99 mg/dL 281 (H)   Review of Glycemic Control  Diabetes history: DM2 Outpatient Diabetes medications: Janumet 50-1000 mg BID, Glipizide XL 5 mg daily Current orders for Inpatient glycemic control: CBGs Q6H  Inpatient Diabetes Program Recommendations:    Insulin: While inpatient, please consider ordering Novolog 0-9 units TID with meals and Novolog 0-5 units QHS.  Thanks, Barnie Alderman, RN, MSN, CDE Diabetes Coordinator Inpatient Diabetes Program [phone] (Team Pager from 8am to 5pm)

## 2020-08-15 NOTE — Consult Note (Addendum)
Referring Provider: Barton Dubois, MD Primary Care Physician:  Sharilyn Sites, MD Primary Gastroenterologist:  Garfield Cornea, MD  Reason for Consultation: melena  HPI: David Pruitt is a 75 y.o. male with stage IIIa chronic kidney disease, history of stroke, GERD, hypertension, type 2 diabetes mellitus, history of diverticular bleed presenting to the emergency department with complaints of several episodes of melena over several days, episode of hematochezia occurring after melanotic stool the day of presentation.  For several days patient stools have been black.  Yesterday after he had a dark stool and noted quite a bit of fresh blood. Last BMat 1pm 08/14/20.  He has been feeling more weak.  Denies any abdominal pain.  No rectal pain. His heartburn has been much better on pantoprazole daily.  He is on Plavix and meloxicam. Remote history of upper endoscopy in 2011 with mild esophagitis, gastritis, duodenitis.  No H. pylori.  Last colonoscopy March 2021 with diverticulosis, 5 polyps removed, multiple tubular adenomas.  Next colonoscopy planned in March 2024.  In the ED, glucose 281, creatinine 1.41, BUN 21, white blood cell count 12,800, hemoglobin 9.6 (down from 13.19 January 2019), platelets 255,000, stool was brown and heme positive.  SARS coronavirus 2 positive (incidental findings, patient asymptomatic).  Hemoglobin this morning down to 8.4.  White blood cell count 9600.  Prior to Admission medications   Medication Sig Start Date End Date Taking? Authorizing Provider  acetaminophen (TYLENOL) 500 MG tablet Take 1,000 mg by mouth every 6 (six) hours as needed for headache.     [provider]  clopidogrel (PLAVIX) 75 MG tablet Take 75 mg by mouth daily.    [provider]  Coenzyme Q10 (CO Q 10) 100 MG CAPS Take 100 mg by mouth daily.    [provider]  ezetimibe-simvastatin (VYTORIN) 10-20 MG per tablet Take 1 tablet by mouth daily.    [provider]   glipiZIDE (GLUCOTROL XL) 5 MG 24 hr tablet Take 5 mg by mouth daily.     [provider]  glucosamine-chondroitin 500-400 MG tablet Take 1 tablet by mouth daily.    [provider]  lisinopril (PRINIVIL,ZESTRIL) 10 MG tablet Take 10 mg by mouth daily.    [provider]  loratadine (CLARITIN) 10 MG tablet Take 10 mg daily by mouth.    [provider]  meloxicam (MOBIC) 15 MG tablet Take 15 mg by mouth daily.    [provider]  metoprolol succinate (TOPROL-XL) 50 MG 24 hr tablet Take 50 mg by mouth daily. 10/23/19   [provider]  Multiple Vitamins-Minerals (MULTIVITAMIN WITH MINERALS) tablet Take 1 tablet by mouth daily.    [provider]  pantoprazole (PROTONIX) 40 MG tablet Take 40 mg by mouth daily.    [provider]  simvastatin (ZOCOR) 20 MG tablet Take 20 mg by mouth at bedtime. 10/14/18   [provider]  sitaGLIPtin-metformin (JANUMET) 50-1000 MG tablet Take 1 tablet 2 (two) times daily with a meal by mouth.    [provider]  vitamin B-12 (CYANOCOBALAMIN) 1000 MCG tablet Take 1,000 mcg daily by mouth.    [provider]    Current Facility-Administered Medications  Medication Dose Route Frequency Provider Last Rate Last Admin   acetaminophen (TYLENOL) tablet 650 mg  650 mg Oral Q6H PRN Reubin Milan, MD       Or   acetaminophen (TYLENOL) suppository 650 mg  650 mg Rectal Q6H PRN Reubin Milan, MD  lactated ringers infusion   Intravenous Continuous Reubin Milan, MD 100 mL/hr at 08/14/20 2320 New Bag at 08/14/20 2320   ondansetron (ZOFRAN) tablet 4 mg  4 mg Oral Q6H PRN Reubin Milan, MD       Or   ondansetron [facility]) injection 4 mg  4 mg Intravenous Q6H PRN Reubin Milan, MD       pantoprazole (PROTONIX) injection 40 mg  40 mg Intravenous Q12H Reubin Milan, MD        Allergies as of 08/14/2020   (No Known Allergies)    Past Medical  History:  Diagnosis Date   CKD (chronic kidney disease), stage IIIa ([place]) 08/15/2020   CVA (cerebral infarction)    2006   DM (dermatomyositis)    Gastritis    GERD (gastroesophageal reflux disease)    Hemorrhoids    Hypercholesteremia    Hypertension    Lower GI bleed    SECONDARY TO DIVERTICULA,S/P BLEED   Overweight (BMI 25.0-29.9) 08/15/2020   Shortness of breath dyspnea    Type 2 diabetes mellitus ([place])     Past Surgical History:  Procedure Laterality Date   COLONOSCOPY  12/2009   RMR: Lower GI bleed due to a single diverticulum status post Endo clipping.  Scattered diverticulosis.   COLONOSCOPY N/A 05/24/2014   Procedure: COLONOSCOPY;  Surgeon: Rogene Houston, MD;  Location: [facility];  Service: Endoscopy;  Laterality: N/A;  1030   COLONOSCOPY N/A 03/15/2019   Diverticulosis, 5 polyps removed.  Pathology revealed tubular adenomas.  Next colonoscopy in 3 years.   ESOPHAGOGASTRODUODENOSCOPY  12/2009   RMR: Mild esophagitis, gastritis, duodenitis.  Gastric biopsies negative for H. pylori   POLYPECTOMY  03/15/2019   Procedure: POLYPECTOMY;  Surgeon: Daneil Dolin, MD;  Location: [facility];  Service: Endoscopy;;    Family History  Problem Relation Age of Onset   Colon cancer Father    Stroke Father     Social History   Socioeconomic History   Marital status: Married    Spouse name: Not on file   Number of children: Not on file   Years of education: Not on file   Highest education level: Not on file  Occupational History   Not on file  Tobacco Use   Smoking status: Former    Packs/day: 1.00    Years: 40.00    Pack years: 40.00    Types: Cigarettes   Smokeless tobacco: Never  Vaping Use   Vaping Use: Never used  Substance and Sexual Activity   Alcohol use: Yes    Comment: occasionally   Drug use: No   Sexual activity: Not on file  Other Topics Concern   Not on file  Social History Narrative   Not on file   Social Determinants of Health   Financial  Resource Strain: Not on file  Food Insecurity: Not on file  Transportation Needs: Not on file  Physical Activity: Not on file  Stress: Not on file  Social Connections: Not on file  Intimate Partner Violence: Not on file     ROS:  General: Negative for anorexia, weight loss, fever, chills, fatigue, +mild weakness. Eyes: Negative for vision changes.  ENT: Negative for hoarseness, difficulty swallowing , nasal congestion. CV: Negative for chest pain, angina, palpitations, dyspnea on exertion, peripheral edema.  Respiratory: Negative for dyspnea at rest, dyspnea on exertion, cough, sputum, wheezing.  GI: See history of present illness. GU:  Negative for dysuria, hematuria, urinary incontinence,  urinary frequency, nocturnal urination.  MS: Negative for joint pain, low back pain.  Derm: Negative for rash or itching.  Neuro: Negative for weakness, abnormal sensation, seizure, frequent headaches, memory loss, confusion.  Psych: Negative for anxiety, depression, suicidal ideation, hallucinations.  Endo: Negative for unusual weight change.  Heme: Negative for bruising or bleeding. Allergy: Negative for rash or hives.       Physical Examination: Vital signs in last 24 hours: Temp:  [97.9 F (36.6 C)-98.4 F (36.9 C)] 98.1 F (36.7 C) (08/04 0610) Pulse Rate:  [78-103] 90 (08/04 0610) Resp:  [12-20] 20 (08/04 0610) BP: (108-156)/(66-84) 123/75 (08/04 0610) SpO2:  [94 %-100 %] 99 % (08/04 0610) Weight:  [93 kg-97.2 kg] 97.2 kg (08/04 0248) Last BM Date: 08/14/20 (State last BM was bright red)  General: Well-nourished, well-developed in no acute distress.  Head: Normocephalic, atraumatic.   Eyes: Conjunctiva pink, no icterus. Mouth: masked Neck: Supple without thyromegaly, masses, or lymphadenopathy.  Lungs: Clear to auscultation bilaterally.  Heart: Regular rate and rhythm, no murmurs rubs or gallops.  Abdomen: Bowel sounds are normal, nontender, nondistended, no hepatosplenomegaly  or masses, no abdominal bruits or    hernia , no rebound or guarding.   Rectal: not performed, completed in ED Extremities: No lower extremity edema, clubbing, deformity.  Neuro: Alert and oriented x 4 , grossly normal neurologically.  Skin: Warm and dry, no rash or jaundice.   Psych: Alert and cooperative, normal mood and affect.        Intake/Output from previous day: 08/03 0701 - 08/04 0700 In: 1550 [I.V.:550; IV Piggyback:1000] Out: -  Intake/Output this shift: No intake/output data recorded.  Lab Results: CBC Recent Labs    08/14/20 1542 08/14/20 2302 08/15/20 0337  WBC 12.8*  --  9.6  HGB 9.6* 8.7* 8.4*  HCT 30.1*  --  27.2*  MCV 92.6  --  94.4  PLT 255  --  209   BMET Recent Labs    08/14/20 1542 08/15/20 0337  NA 138 141  K 4.3 4.4  CL 106 110  CO2 24 25  GLUCOSE 281* 129*  BUN 21 17  CREATININE 1.41* 1.20  CALCIUM 9.3 8.8*   LFT Recent Labs    08/14/20 1542 08/15/20 0337  BILITOT 0.7 0.6  ALKPHOS 50 41  AST 20 17  ALT 20 18  PROT 6.9 5.9*  ALBUMIN 4.0 3.6    Lipase No results for input(s): LIPASE in the last 72 hours.  PT/INR No results for input(s): LABPROT, INR in the last 72 hours.    Imaging Studies: No results found.Minnie.Brome week]   Impression: Pleasant 75 y/o male with history of stage IIIa chronic kidney disease, stroke, GERD, history of diverticular bleed presenting with several day history of black stools, single episode of fresh blood per rectum yesterday.  GI bleed: Patient report several days of black stools.  Yesterday single episode of moderate volume hematochezia.  Per ED report by Dr. Roderic Palau, brown heme positive stool.  Hemoglobin January 2021 was 13.7.  Hemoglobin 9.6 when he presented yesterday, down to 8.4 today.  Patient had remote EGD, colonoscopy 03/2019 as above. Prior history of diverticular bleed. Diverticular bleeding would not explain reported melena therefore would recommend look at UGI tract to rule out ulcer, AVMs,  Dieulafoy lesion.    Covid +: incidental finding. Patient has received second booster two weeks ago. Asymptomatic at this time.    Plan: Agree with IV pantoprazole. EGD, timing to be determined.  I have discussed the risks, alternatives, benefits with regards to but not limited to the risk of reaction to medication, bleeding, infection, perforation and the patient is agreeable to proceed. Written consent to be obtained. Of note, patient states he would prefer to avoid blood transfusion if possible but if absolutely needed he would consider.   We would like to thank you for the opportunity to participate in the care of David Pruitt.  Laureen Ochs. Bernarda Caffey [facility] [phone] 8/4/20229:35 AM    LOS: 0 days

## 2020-08-15 NOTE — Progress Notes (Signed)
Covid result positive. Dr. Olevia Bowens notified. Covid precautions initiated.

## 2020-08-15 NOTE — Transfer of Care (Signed)
Immediate Anesthesia Transfer of Care Note  Patient: David Pruitt  Procedure(s) Performed: ESOPHAGOGASTRODUODENOSCOPY (EGD) WITH PROPOFOL  Patient Location: PACU  Anesthesia Type:General  Level of Consciousness: awake, alert  and oriented  Airway & Oxygen Therapy: Patient Spontanous Breathing and Patient connected to nasal cannula oxygen  Post-op Assessment: Report given to RN and Post -op Vital signs reviewed and stable  Post vital signs: Reviewed and stable  Last Vitals:  Vitals Value Taken Time  BP 126/71 08/15/20 1600  Temp 97.1   Pulse 92 08/15/20 1602  Resp 16   SpO2 98 % 08/15/20 1602  Vitals shown include unvalidated device data.  Last Pain:  Vitals:   08/15/20 1535  TempSrc:   PainSc: 0-No pain         Complications: No notable events documented.

## 2020-08-16 ENCOUNTER — Inpatient Hospital Stay (HOSPITAL_COMMUNITY): Payer: Medicare Other | Admitting: Anesthesiology

## 2020-08-16 ENCOUNTER — Encounter (HOSPITAL_COMMUNITY): Payer: Self-pay | Admitting: Internal Medicine

## 2020-08-16 ENCOUNTER — Encounter (HOSPITAL_COMMUNITY)
Admission: EM | Disposition: A | Discharge status: Home / Self Care | Payer: Self-pay | Source: Home / Self Care | Attending: Internal Medicine

## 2020-08-16 DIAGNOSIS — K625 Hemorrhage of anus and rectum: Secondary | ICD-10-CM

## 2020-08-16 DIAGNOSIS — K921 Melena: Secondary | ICD-10-CM

## 2020-08-16 DIAGNOSIS — K5730 Diverticulosis of large intestine without perforation or abscess without bleeding: Secondary | ICD-10-CM

## 2020-08-16 DIAGNOSIS — K648 Other hemorrhoids: Secondary | ICD-10-CM

## 2020-08-16 DIAGNOSIS — K2100 Gastro-esophageal reflux disease with esophagitis, without bleeding: Secondary | ICD-10-CM

## 2020-08-16 DIAGNOSIS — U071 COVID-19: Secondary | ICD-10-CM

## 2020-08-16 DIAGNOSIS — E7800 Pure hypercholesterolemia, unspecified: Secondary | ICD-10-CM

## 2020-08-16 DIAGNOSIS — E1165 Type 2 diabetes mellitus with hyperglycemia: Secondary | ICD-10-CM

## 2020-08-16 DIAGNOSIS — E6609 Other obesity due to excess calories: Secondary | ICD-10-CM

## 2020-08-16 DIAGNOSIS — Z6830 Body mass index (BMI) 30.0-30.9, adult: Secondary | ICD-10-CM

## 2020-08-16 DIAGNOSIS — I10 Essential (primary) hypertension: Secondary | ICD-10-CM

## 2020-08-16 DIAGNOSIS — N1831 Chronic kidney disease, stage 3a: Secondary | ICD-10-CM

## 2020-08-16 HISTORY — PX: COLONOSCOPY WITH PROPOFOL: SHX5780

## 2020-08-16 LAB — BASIC METABOLIC PANEL
Anion gap: 8 (ref 5–15)
BUN: 10 mg/dL (ref 8–23)
CO2: 27 mmol/L (ref 22–32)
Calcium: 9 mg/dL (ref 8.9–10.3)
Chloride: 103 mmol/L (ref 98–111)
Creatinine, Ser: 1.12 mg/dL (ref 0.61–1.24)
GFR, Estimated: >60 mL/min (ref >60)
Glucose, Bld: 204 mg/dL — ABNORMAL HIGH (ref 70–99)
Potassium: 4.1 mmol/L (ref 3.5–5.1)
Sodium: 138 mmol/L (ref 135–145)

## 2020-08-16 LAB — CBC
HCT: 27.1 % — ABNORMAL LOW (ref 39.0–52.0)
Hemoglobin: 8.6 g/dL — ABNORMAL LOW (ref 13.0–17.0)
MCH: 29.3 pg (ref 26.0–34.0)
MCHC: 31.7 g/dL (ref 30.0–36.0)
MCV: 92.2 fL (ref 80.0–100.0)
Platelets: 219 10*3/uL (ref 150–400)
RBC: 2.94 MIL/uL — ABNORMAL LOW (ref 4.22–5.81)
RDW: 15.7 % — ABNORMAL HIGH (ref 11.5–15.5)
WBC: 8.5 10*3/uL (ref 4.0–10.5)
nRBC: 0 % (ref 0.0–0.2)

## 2020-08-16 LAB — GLUCOSE, CAPILLARY
Glucose-Capillary: 189 mg/dL — ABNORMAL HIGH (ref 70–99)
Glucose-Capillary: 215 mg/dL — ABNORMAL HIGH (ref 70–99)

## 2020-08-16 SURGERY — COLONOSCOPY WITH PROPOFOL
Anesthesia: General

## 2020-08-16 MED ORDER — STERILE WATER FOR IRRIGATION IR SOLN
Status: DC | PRN
  Administered 2020-08-16: 200 mL

## 2020-08-16 MED ORDER — PROPOFOL 10 MG/ML IV BOLUS
INTRAVENOUS | Status: DC | PRN
  Administered 2020-08-16 (×3): 50 mg via INTRAVENOUS
  Administered 2020-08-16: 100 mg via INTRAVENOUS
  Administered 2020-08-16: 50 mg via INTRAVENOUS

## 2020-08-16 MED ORDER — METOPROLOL SUCCINATE ER 50 MG PO TB24
50.0000 mg | ORAL_TABLET | Freq: Every day | ORAL | Status: DC
  Administered 2020-08-16: 50 mg via ORAL
  Filled 2020-08-16: qty 1

## 2020-08-16 MED ORDER — SODIUM CHLORIDE 0.9 % IV SOLN: INTRAVENOUS | Status: DC

## 2020-08-16 MED ORDER — CLOPIDOGREL BISULFATE 75 MG PO TABS: 75.0000 mg | ORAL_TABLET | Freq: Every day | ORAL | Status: AC

## 2020-08-16 MED ORDER — SODIUM CHLORIDE 0.9 % IV SOLN
INTRAVENOUS | Status: DC
Start: 2020-08-16 — End: 2020-08-16

## 2020-08-16 NOTE — Care Management Important Message (Signed)
Important Message  Patient Details  Name: David Pruitt MRN: [id number] Date of Birth: November 11, 1945   Medicare Important Message Given:  Yes - Important Message mailed due to current National Emergency     Tommy Medal 08/16/2020, 1:22 PM

## 2020-08-16 NOTE — Progress Notes (Signed)
Subjective:  Patient has no complaints.  He states the last time he went to the bathroom he noted small amount of fresh blood on the tissue.  He did not experience frank rectal bleeding.  He denies chest pain shortness of breath or abdominal pain. Patient's wife states that he has been on meloxicam for several months.  Current Medications:  Current Facility-Administered Medications:    acetaminophen (TYLENOL) tablet 650 mg, 650 mg, Oral, Q6H PRN **OR** acetaminophen (TYLENOL) suppository 650 mg, 650 mg, Rectal, Q6H PRN, Reubin Milan, MD   bebtelovimab EUA injection SOLN 175 mg, 175 mg, Intravenous, Once, Barton Dubois, MD   insulin aspart (novoLOG) injection 0-5 Units, 0-5 Units, Subcutaneous, QHS, Barton Dubois, MD   insulin aspart (novoLOG) injection 0-9 Units, 0-9 Units, Subcutaneous, TID WC, Barton Dubois, MD, 2 Units at 08/16/20 6761   lactated ringers infusion, , Intravenous, Continuous, Barton Dubois, MD, Last Rate: 75 mL/hr at 08/16/20 0835, Rate Change at 08/16/20 0835   mupirocin ointment (BACTROBAN) 2 % 1 application, 1 application, Nasal, BID, Barton Dubois, MD, 1 application at 95/09/32 0829   ondansetron (ZOFRAN) tablet 4 mg, 4 mg, Oral, Q6H PRN **OR** ondansetron (ZOFRAN) injection 4 mg, 4 mg, Intravenous, Q6H PRN, Reubin Milan, MD   pantoprazole (PROTONIX) injection 40 mg, 40 mg, Intravenous, Q12H, Reubin Milan, MD, 40 mg at 08/16/20 6712   Objective: Blood pressure (!) 151/84, pulse 89, temperature 98.5 F (36.9 C), temperature source Oral, resp. rate 20, height $RemoveBe'5\' 10"'gKNNLimGx$  (1.778 m), weight 97.2 kg, SpO2 98 %. Patient is alert and in no acute distress. He appears pale. No neck masses or thyromegaly noted. Cardiac exam with regular rhythm normal S1 and S2. No murmur or gallop noted. Lungs are clear to auscultation. Abdomen is full but soft and nontender with organomegaly or masses. No LE edema or clubbing noted.  Labs/studies Results:   CBC  Latest Ref Rng & Units 08/16/2020 08/15/2020 08/14/2020  WBC 4.0 - 10.5 K/uL 8.5 9.6 -  Hemoglobin 13.0 - 17.0 g/dL 8.6(L) 8.4(L) 8.7(L)  Hematocrit 39.0 - 52.0 % 27.1(L) 27.2(L) -  Platelets 150 - 400 K/uL 219 209 -    CMP Latest Ref Rng & Units 08/16/2020 08/15/2020 08/14/2020  Glucose 70 - 99 mg/dL 204(H) 129(H) 281(H)  BUN 8 - 23 mg/dL $Remove'10 17 21  'qRXHcJV$ Creatinine 0.61 - 1.24 mg/dL 1.12 1.20 1.41(H)  Sodium 135 - 145 mmol/L 138 141 138  Potassium 3.5 - 5.1 mmol/L 4.1 4.4 4.3  Chloride 98 - 111 mmol/L 103 110 106  CO2 22 - 32 mmol/L $RemoveB'27 25 24  'tFDDQjZT$ Calcium 8.9 - 10.3 mg/dL 9.0 8.8(L) 9.3  Total Protein 6.5 - 8.1 g/dL - 5.9(L) 6.9  Total Bilirubin 0.3 - 1.2 mg/dL - 0.6 0.7  Alkaline Phos 38 - 126 U/L - 41 50  AST 15 - 41 U/L - 17 20  ALT 0 - 44 U/L - 18 20    Hepatic Function Latest Ref Rng & Units 08/15/2020 08/14/2020 12/02/2018  Total Protein 6.5 - 8.1 g/dL 5.9(L) 6.9 7.2  Albumin 3.5 - 5.0 g/dL 3.6 4.0 4.5  AST 15 - 41 U/L $Remo'17 20 21  'dKsRq$ ALT 0 - 44 U/L $Remo'18 20 26  'sYend$ Alk Phosphatase 38 - 126 U/L 41 50 49  Total Bilirubin 0.3 - 1.2 mg/dL 0.6 0.7 1.2  Bilirubin, Direct 0.0 - 0.3 mg/dL - - -     Assessment:  #1.  GI bleed.  Patient underwent esophagogastroduodenoscopy by Dr. Gala Romney yesterday  and no bleeding lesion was found.  Patient has finished GoLytely prep for colonoscopy to be performed this afternoon.   If no source identified on colonoscopy we will plan outpatient small bowel given capsule study. In the meantime he should not be taking NSAIDs while he is on clopidogrel.  #2.  Anemia secondary to GI blood loss.  Hemoglobin is low but has leveled off.  He has not required blood transfusion.  Hemoglobin was over 13 g 1 year ago.  #3.  COVID PR test positive.  Patient is asymptomatic.  He remains in isolation.   Plan:  Diagnostic colonoscopy this afternoon.

## 2020-08-16 NOTE — Progress Notes (Signed)
Brief colonoscopy note  Multiple diverticula at sigmoid colon; none with stigmata of bleed. Internal hemorrhoids.

## 2020-08-16 NOTE — Anesthesia Postprocedure Evaluation (Signed)
Anesthesia Post Note  Patient: David Pruitt  Procedure(s) Performed: COLONOSCOPY WITH PROPOFOL  Patient location during evaluation: Phase II Anesthesia Type: General Level of consciousness: awake and alert and oriented Pain management: pain level controlled Vital Signs Assessment: post-procedure vital signs reviewed and stable Respiratory status: spontaneous breathing and respiratory function stable Cardiovascular status: blood pressure returned to baseline and stable Postop Assessment: no apparent nausea or vomiting Anesthetic complications: no   No notable events documented.   Last Vitals:  Vitals:   08/16/20 1345 08/16/20 1404  BP: 119/77 138/90  Pulse: 83 92  Resp: 20 18  Temp: (!) 36.3 C 36.7 C  SpO2: 100% 100%    Last Pain:  Vitals:   08/16/20 1404  TempSrc: Oral  PainSc:                  Johnn Krasowski C Sharnetta Gielow

## 2020-08-16 NOTE — Anesthesia Preprocedure Evaluation (Signed)
Anesthesia Evaluation  Patient identified by MRN, date of birth, ID band Patient awake    Reviewed: Allergy & Precautions, NPO status , Patient's Chart, lab work & pertinent test results, reviewed documented beta blocker date and time   History of Anesthesia Complications Negative for: history of anesthetic complications  Airway Mallampati: II  TM Distance: >3 FB Neck ROM: Full    Dental  (+) Dental Advisory Given, Missing, Partial Lower   Pulmonary shortness of breath, sleep apnea and Continuous Positive Airway Pressure Ventilation , former smoker,    Pulmonary exam normal breath sounds clear to auscultation       Cardiovascular Exercise Tolerance: Good hypertension, Pt. on medications and Pt. on home beta blockers Normal cardiovascular exam Rhythm:Regular Rate:Normal     Neuro/Psych  Neuromuscular disease CVA    GI/Hepatic GERD  Medicated and Controlled,  Endo/Other  diabetes, Well Controlled, Type 2, Oral Hypoglycemic Agents  Renal/GU Renal InsufficiencyRenal disease     Musculoskeletal   Abdominal   Peds  Hematology  (+) anemia ,   Anesthesia Other Findings Covid positive  Reproductive/Obstetrics                             Anesthesia Physical  Anesthesia Plan  ASA: 3  Anesthesia Plan: General   Post-op Pain Management:    Induction: Intravenous  PONV Risk Score and Plan: Propofol infusion  Airway Management Planned: Nasal Cannula and Natural Airway  Additional Equipment:   Intra-op Plan:   Post-operative Plan:   Informed Consent: I have reviewed the patients History and Physical, chart, labs and discussed the procedure including the risks, benefits and alternatives for the proposed anesthesia with the patient or authorized representative who has indicated his/her understanding and acceptance.     Dental advisory given  Plan Discussed with: CRNA and  Surgeon  Anesthesia Plan Comments:         Anesthesia Quick Evaluation

## 2020-08-16 NOTE — Transfer of Care (Signed)
Immediate Anesthesia Transfer of Care Note  Patient: David Pruitt  Procedure(s) Performed: COLONOSCOPY WITH PROPOFOL  Patient Location: PACU  Anesthesia Type:General  Level of Consciousness: awake, alert  and oriented  Airway & Oxygen Therapy: Patient Spontanous Breathing and Patient connected to nasal cannula oxygen  Post-op Assessment: Report given to RN and Post -op Vital signs reviewed and stable  Post vital signs: Reviewed and stable  Last Vitals:  Vitals Value Taken Time  BP 119/77 08/16/20  1346  Temp    Pulse 85 08/16/20  1346  Resp 29 0/05/22  1346  SpO2 99 08/16/20 1346    Last Pain:  Vitals:   08/16/20 1344  TempSrc:   PainSc: 0-No pain         Complications: No notable events documented.

## 2020-08-16 NOTE — Discharge Summary (Signed)
Physician Discharge Summary  David Pruitt [id number] DOB: June 17, 1945 DOA: 08/14/2020  PCP: Sharilyn Sites, MD  Admit date: 08/14/2020 Discharge date: 08/16/2020  Time spent: 35 minutes  Recommendations for Outpatient Follow-up:  Repeat CBC to follow hemoglobin trend and stability Repeat basic metabolic panel to evaluate lites and renal function Close monitoring of patient's CBGs with adjustment to hypoglycemia regimen as needed   Discharge Diagnoses:  Principal Problem:   Gastrointestinal hemorrhage with melena Active Problems:   GERD (gastroesophageal reflux disease)   Acute blood loss anemia   Hypertension   Hypercholesteremia   Type 2 diabetes mellitus with hyperglycemia (HCC)   CKD (chronic kidney disease), stage IIIa (HCC)   Overweight (BMI 25.0-29.9)   Class 1 obesity due to excess calories with body mass index (BMI) of 30.0 to 30.9 in adult   COVID-19 virus infection   Discharge Condition: Stable and improved.  Discharged home with instruction to follow-up with PCP and GI service as an outpatient.  CODE STATUS: Full code.  Diet recommendation: Heart healthy diet.  Filed Weights   08/14/20 1514 08/15/20 0248  Weight: 93 kg 97.2 kg    History of present illness:  As per H&P written by Dr. Olevia Bowens on 08/15/20 David Pruitt is a 75 y.o. male with medical history significant of stage IIIa CKD, history of other nonhemorrhagic CVA, dermatomyositis, gastritis, GERD, hemorrhoids, hyperlipidemia, hypertension, type II DM, overweight, history of diverticular bleed who is coming to the emergency department with complaints of several episodes of melena over the last few days and 1 episode of hematochezia earlier today after melanotic stool BM.  Denied abdominal pain, nausea, emesis, diarrhea or constipation.  He has felt weaker than usual, but denied dizziness, dyspnea, chest pain, somnolence, diaphoresis, PND, orthopnea or pitting edema of the lower extremities.  No dysuria,  frequency hematuria.  No polyuria, polydipsia, polyphagia or blurred vision.   ED Course: Initial vital signs were temperature 98.4 F, pulse 103, respirations 16, BP 110/66 mmHg O2 sat 98% on room air.  He received a 1000 mL NS bolus and 40 mg of pantoprazole IVP x1.  Hospital Course:  1-acute blood loss anemia: In the setting of gastrointestinal hemorrhage with melena. -Patient hemoglobin stabilizes without requirement of transfusion. -At discharge hemoglobin 8.6 -No overt bleeding appreciated while hospitalized -Endoscopy and colonoscopy failed to reveal acute source for blood loss. -Continue daily Protonix -Avoid the use of NSAIDs/Mobic and okay to resume Plavix on 08/19/2020 -Patient will follow-up with GI service for capsule endoscopy and repeat labs on 08/21/2020.  2-hypertension -Resume home antihypertensive regimen.  3-hyperlipidemia -Continue statins  4-class I obesity -Body mass index is 30.75 kg/m. -Low calorie diet, portion control and increase physical activity discussed with patient.  5-uncontrolled type 2 diabetes with hyperglycemia -A1c 9.0 -Patient will require close follow-up on his CBGs with further adjustment to hypoglycemia regimen -Patient has declined insulin currently and would like to follow-up with his PCP to further determine next strategy -Resuming oral hypoglycemic agents at discharge.  6-COVID-19 infection -Patient is vaccinated and boosted -Completely asymptomatic and incidental findings. -He has declined monoclonal antibody infusion during this hospitalization. -Advised to maintain adequate hydration and continue supportive care/symptomatic management.  Procedures: EGD and colonoscopy: demonstrating early barrette esophagus changes, multiple diverticula sigmoid colon and internal hemorrhoids; no acute bleeding appreciated.  Consultations: GI  Discharge Exam: Vitals:   08/16/20 1345 08/16/20 1404  BP: 119/77 138/90  Pulse: 83 92  Resp: 20  18  Temp: (!) 97.3 F (36.3 C)  98.1 F (36.7 C)  SpO2: 100% 100%    General: No fever, no chest pain, no nausea, no vomiting.  Reports no overt bleeding and is feeling ready to go home. Cardiovascular: S1 and S2, no rubs, no gallops, no JVD. Respiratory:  Discharge Instructions   Discharge Instructions     Diet - low sodium heart healthy   Complete by: As directed    Discharge instructions   Complete by: As directed    Take medications as prescribed Maintain adequate hydration Avoid the use of NSAIDs and Mobic Okay to resume the use of Plavix on 08/19/2020 Follow-up with gastroenterology service as instructed for capsule endoscopy evaluation.      Allergies as of 08/16/2020   No Known Allergies      Medication List     STOP taking these medications    meloxicam 15 MG tablet Commonly known as: MOBIC       TAKE these medications    acetaminophen 500 MG tablet Commonly known as: TYLENOL Take 1,000 mg by mouth every 6 (six) hours as needed for headache.   ascorbic acid 1000 MG tablet Commonly known as: VITAMIN C Take 1,000 mg by mouth in the morning and at bedtime.   clopidogrel 75 MG tablet Commonly known as: PLAVIX Take 1 tablet (75 mg total) by mouth daily. Start taking on: August 19, 2020 What changed: These instructions start on August 19, 2020. If you are unsure what to do until then, ask your doctor or other care provider.   glipiZIDE 5 MG 24 hr tablet Commonly known as: GLUCOTROL XL Take 5 mg by mouth daily.   glucosamine-chondroitin 500-400 MG tablet Take 1 tablet by mouth daily.   lisinopril 10 MG tablet Commonly known as: ZESTRIL Take 10 mg by mouth daily.   loratadine 10 MG tablet Commonly known as: CLARITIN Take 10 mg daily by mouth.   metoprolol succinate 50 MG 24 hr tablet Commonly known as: TOPROL-XL Take 50 mg by mouth daily.   multivitamin with minerals tablet Take 1 tablet by mouth daily.   pantoprazole 40 MG tablet Commonly  known as: PROTONIX Take 40 mg by mouth daily.   Probiotic (Lactobacillus) Caps Take 1 capsule by mouth daily.   simvastatin 20 MG tablet Commonly known as: ZOCOR Take 20 mg by mouth at bedtime.   sitaGLIPtin-metformin 50-1000 MG tablet Commonly known as: JANUMET Take 1 tablet 2 (two) times daily with a meal by mouth.   vitamin B-12 1000 MCG tablet Commonly known as: CYANOCOBALAMIN Take 1,000 mcg daily by mouth.       No Known Allergies  Follow-up Information     Sharilyn Sites, MD. Schedule an appointment as soon as possible for a visit in 10 day(s).   Specialty: Family Medicine Contact information: [address] [place] [id number] [phone]         Rogene Houston, MD. Go on 08/21/2020.   Specialty: Gastroenterology Why: for capsule endoscopy evaluation. contact office for appointment details and further instructions Contact information: [place], [address] [place] [address] [phone]                The results of significant diagnostics from this hospitalization (including imaging, microbiology, ancillary and laboratory) are listed below for reference.    Significant Diagnostic Studies: No results found.  Microbiology: Recent Results (from the past 240 hour(s))  Resp Panel by RT-PCR (Flu A&B, Covid) Nasopharyngeal Swab     Status: Abnormal   Collection Time:  08/15/20  2:10 AM   Specimen: Nasopharyngeal Swab; Nasopharyngeal(NP) swabs in vial transport medium  Result Value Ref Range Status   SARS Coronavirus 2 by RT PCR POSITIVE (A) NEGATIVE Final    Comment: RESULT CALLED TO, READ BACK BY AND VERIFIED WITH: RHEW,J @ 0313 ON 08/15/20 BY JUW (NOTE) SARS-CoV-2 target nucleic acids are DETECTED.  The SARS-CoV-2 RNA is generally detectable in upper respiratory specimens during the acute phase of infection. Positive results are indicative of the presence of the identified virus, but do not rule out bacterial infection or  co-infection with other pathogens not detected by the test. Clinical correlation with patient history and other diagnostic information is necessary to determine patient infection status. The expected result is Negative.  Fact Sheet for Patients: EntrepreneurPulse.com.au  Fact Sheet for Healthcare Providers: IncredibleEmployment.be  This test is not yet approved or cleared by the [place] FDA and  has been authorized for detection and/or diagnosis of SARS-CoV-2 by FDA under an Emergency Use Authorization (EUA).  This EUA will remain in effect (meaning this test can be [place] ed) for the duration of  the COVID-19 declaration under Section 564(b)(1) of the Act, 21 U.S.C. section 360bbb-3(b)(1), unless the authorization is terminated or revoked sooner.     Influenza A by PCR NEGATIVE NEGATIVE Final   Influenza B by PCR NEGATIVE NEGATIVE Final    Comment: (NOTE) The Xpert Xpress SARS-CoV-2/FLU/RSV plus assay is intended as an aid in the diagnosis of influenza from Nasopharyngeal swab specimens and should not be used as a sole basis for treatment. Nasal washings and aspirates are unacceptable for Xpert Xpress SARS-CoV-2/FLU/RSV testing.  Fact Sheet for Patients: EntrepreneurPulse.com.au  Fact Sheet for Healthcare Providers: IncredibleEmployment.be  This test is not yet approved or cleared by the [place] FDA and has been authorized for detection and/or diagnosis of SARS-CoV-2 by FDA under an Emergency Use Authorization (EUA). This EUA will remain in effect (meaning this test can be used) for the duration of the COVID-19 declaration under Section 564(b)(1) of the Act, 21 U.S.C. section 360bbb-3(b)(1), unless the authorization is terminated or revoked.  Performed at [facility], [address] [place], [place] [address]   Surgical PCR screen     Status: None   Collection Time: 08/15/20  8:19 PM    Specimen: Nasal Mucosa; Nasal Swab  Result Value Ref Range Status   MRSA, PCR NEGATIVE NEGATIVE Final   Staphylococcus aureus NEGATIVE NEGATIVE Final    Comment: (NOTE) The Xpert SA Assay (FDA approved for NASAL specimens in patients 76 years of age and older), is one component of a comprehensive surveillance program. It is not intended to diagnose infection nor to guide or monitor treatment. Performed at [facility], [address] [place], [place] [address]      Labs: Basic Metabolic Panel: Recent Labs  Lab 08/14/20 1542 08/15/20 0337 08/16/20 0727  NA 138 141 138  K 4.3 4.4 4.1  CL 106 110 103  CO2 '24 25 27  '$ GLUCOSE 281* 129* 204*  BUN '21 17 10  '$ CREATININE 1.41* 1.20 1.12  CALCIUM 9.3 8.8* 9.0   Liver Function Tests: Recent Labs  Lab 08/14/20 1542 08/15/20 0337  AST 20 17  ALT 20 18  ALKPHOS 50 41  BILITOT 0.7 0.6  PROT 6.9 5.9*  ALBUMIN 4.0 3.6   CBC: Recent Labs  Lab 08/14/20 1542 08/14/20 2302 08/15/20 0337 08/16/20 0727  WBC 12.8*  --  9.6 8.5  HGB 9.6* 8.7* 8.4* 8.6*  HCT 30.1*  --  27.2* 27.1*  MCV 92.6  --  94.4 92.2  PLT 255  --  209 219    CBG: Recent Labs  Lab 08/15/20 0427 08/15/20 1301 08/15/20 2229 08/16/20 0735 08/16/20 1153  GLUCAP 126* 160* 199* 189* 215*    Signed:  Barton Dubois MD.  [facility] 08/16/2020, 3:13 PM

## 2020-08-16 NOTE — Op Note (Signed)
[facility] Patient Name: David Pruitt Procedure Date: 08/16/2020 12:30 PM MRN: [id number] Date of Birth: [birth year] Attending MD: Hildred Laser , MD CSN: [id number] Age: 75 Admit Type: Outpatient Procedure:                Colonoscopy Indications:              Melena, Rectal bleeding Providers:                Hildred Laser, MD, Lurline Del, RN, Aram Candela Referring MD:             Barton Dubois, MD Medicines:                Propofol per Anesthesia Complications:            No immediate complications. Estimated Blood Loss:     Estimated blood loss: none. Procedure:                Pre-Anesthesia Assessment:                           - Prior to the procedure, a History and Physical                            was performed, and patient medications and                            allergies were reviewed. The patient's tolerance of                            previous anesthesia was also reviewed. The risks                            and benefits of the procedure and the sedation                            options and risks were discussed with the patient.                            All questions were answered, and informed consent                            was obtained. Prior Anticoagulants: The patient                            last took Plavix (clopidogrel) 2 days prior to the                            procedure. ASA Grade Assessment: III - A patient                            with severe systemic disease. After reviewing the                            risks and benefits, the patient was deemed in  satisfactory condition to undergo the procedure.                           After obtaining informed consent, the colonoscope                            was passed under direct vision. Throughout the                            procedure, the patient's blood pressure, pulse, and                            oxygen saturations were monitored continuously. The                             [id number]) scope was introduced through                            the anus and advanced to the the cecum, identified                            by appendiceal orifice and ileocecal valve. The                            colonoscopy was performed without difficulty. The                            patient tolerated the procedure well. The quality                            of the bowel preparation was good. The ileocecal                            valve, appendiceal orifice, and rectum were                            photographed. Scope In: 1:18:51 PM Scope Out: 1:38:06 PM Scope Withdrawal Time: 0 hours 11 minutes 44 seconds  Total Procedure Duration: 0 hours 19 minutes 15 seconds  Findings:      The perianal and digital rectal examinations were normal.      The descending colon, splenic flexure, transverse colon, hepatic       flexure, ascending colon, cecum, appendiceal orifice and ileocecal valve       appeared normal.      Multiple diverticula were found in the sigmoid colon without stigmata of       bleed.      Internal hemorrhoids were found during retroflexion. The hemorrhoids       were medium-sized. Impression:               - The descending colon, splenic flexure, transverse                            colon, hepatic flexure, ascending colon, cecum,  appendiceal orifice and ileocecal valve are normal.                           - Diverticulosis in the sigmoid colon.                           - Internal hemorrhoids.                           - No specimens collected. Moderate Sedation:      Per Anesthesia Care Recommendation:           - Return patient to hospital ward for possible                            discharge same day.                           - Low sodium diet today.                           - Continue present medications.                           - Resume Plavix (clopidogrel) at prior dose in 3                             days.                           - To visualize the small bowel, perform video                            capsule endoscopy at appointment to be scheduled. Procedure Code(s):        --- Professional ---                           [phone], Colonoscopy, flexible; diagnostic, including                            collection of specimen(s) by brushing or washing,                            when performed (separate procedure) Diagnosis Code(s):        --- Professional ---                           [id number], Other hemorrhoids                           K92.1, Melena (includes Hematochezia)                           K62.5, Hemorrhage of anus and rectum                           K57.30, Diverticulosis of large intestine without  perforation or abscess without bleeding CPT copyright 2019 American Medical Association. All rights reserved. The codes documented in this report are preliminary and upon coder review may  be revised to meet current compliance requirements. Hildred Laser, MD Hildred Laser, MD 08/16/2020 2:11:25 PM This report has been signed electronically. Number of Addenda: 0

## 2020-08-19 ENCOUNTER — Encounter (INDEPENDENT_AMBULATORY_CARE_PROVIDER_SITE_OTHER): Payer: Self-pay

## 2020-08-19 ENCOUNTER — Encounter (HOSPITAL_COMMUNITY): Payer: Self-pay | Admitting: Internal Medicine

## 2020-08-19 ENCOUNTER — Telehealth (INDEPENDENT_AMBULATORY_CARE_PROVIDER_SITE_OTHER): Payer: Self-pay | Admitting: *Deleted

## 2020-08-19 ENCOUNTER — Other Ambulatory Visit (INDEPENDENT_AMBULATORY_CARE_PROVIDER_SITE_OTHER): Payer: Self-pay

## 2020-08-19 DIAGNOSIS — D500 Iron deficiency anemia secondary to blood loss (chronic): Secondary | ICD-10-CM

## 2020-08-19 NOTE — Telephone Encounter (Signed)
Pt's wife called Adela Lank [phone]. Pt has covid. No symptoms. States his test was positive last Thursday 8/4. Since then has taken two home test that are negative. States he had colonoscopy last week with dr Laural Golden and schedueld to have capsule test. This was suppose to be this week but changed due to him having covid to the 18th. Wife states hb was 8.6. he is due to have bw tomorrow  (8/9). States he is weak but about the same as he has been no worse. Wonders if he should be taking iron. (Aware dr Laural Golden is out of office today and will get a call back tomorrow)

## 2020-08-20 ENCOUNTER — Ambulatory Visit (HOSPITAL_COMMUNITY): Admit: 2020-08-20 | Payer: Medicare Other | Admitting: Internal Medicine

## 2020-08-20 ENCOUNTER — Other Ambulatory Visit: Payer: Self-pay

## 2020-08-20 ENCOUNTER — Other Ambulatory Visit (HOSPITAL_COMMUNITY)
Admission: RE | Admit: 2020-08-20 | Discharge: 2020-08-20 | Disposition: A | Discharge status: Home / Self Care | Payer: Medicare Other | Source: Ambulatory Visit | Attending: Internal Medicine | Admitting: Internal Medicine

## 2020-08-20 ENCOUNTER — Other Ambulatory Visit (INDEPENDENT_AMBULATORY_CARE_PROVIDER_SITE_OTHER): Payer: Self-pay | Admitting: *Deleted

## 2020-08-20 DIAGNOSIS — D500 Iron deficiency anemia secondary to blood loss (chronic): Secondary | ICD-10-CM | POA: Diagnosis not present

## 2020-08-20 LAB — HEMOGLOBIN AND HEMATOCRIT, BLOOD
HCT: 30 % — ABNORMAL LOW (ref 39.0–52.0)
Hemoglobin: 9.2 g/dL — ABNORMAL LOW (ref 13.0–17.0)

## 2020-08-20 NOTE — Telephone Encounter (Signed)
Dr. Laural Golden called and spoke with pt on the phone. He also ordered H&H to do on 8/18 the day of the capsule study. Called and discussed with pt and he wanted orders mailed to him. Bw order placed in the mail.

## 2020-08-21 DIAGNOSIS — K922 Gastrointestinal hemorrhage, unspecified: Secondary | ICD-10-CM | POA: Diagnosis not present

## 2020-08-21 DIAGNOSIS — E6609 Other obesity due to excess calories: Secondary | ICD-10-CM | POA: Diagnosis not present

## 2020-08-21 DIAGNOSIS — Z6830 Body mass index (BMI) 30.0-30.9, adult: Secondary | ICD-10-CM | POA: Diagnosis not present

## 2020-08-21 DIAGNOSIS — E1165 Type 2 diabetes mellitus with hyperglycemia: Secondary | ICD-10-CM | POA: Diagnosis not present

## 2020-08-29 ENCOUNTER — Encounter (HOSPITAL_COMMUNITY)
Admission: RE | Disposition: A | Discharge status: Home / Self Care | Payer: Self-pay | Source: Ambulatory Visit | Attending: Internal Medicine

## 2020-08-29 ENCOUNTER — Encounter (HOSPITAL_COMMUNITY): Payer: Self-pay

## 2020-08-29 ENCOUNTER — Other Ambulatory Visit (INDEPENDENT_AMBULATORY_CARE_PROVIDER_SITE_OTHER): Payer: Self-pay

## 2020-08-29 ENCOUNTER — Ambulatory Visit (HOSPITAL_COMMUNITY)
Admission: RE | Admit: 2020-08-29 | Discharge: 2020-08-29 | Disposition: A | Discharge status: Home / Self Care | Payer: Medicare Other | Source: Ambulatory Visit | Attending: Internal Medicine | Admitting: Internal Medicine

## 2020-08-29 ENCOUNTER — Encounter (HOSPITAL_COMMUNITY): Payer: Self-pay | Admitting: Internal Medicine

## 2020-08-29 DIAGNOSIS — Z9889 Other specified postprocedural states: Secondary | ICD-10-CM | POA: Diagnosis not present

## 2020-08-29 DIAGNOSIS — K5730 Diverticulosis of large intestine without perforation or abscess without bleeding: Secondary | ICD-10-CM | POA: Diagnosis not present

## 2020-08-29 DIAGNOSIS — K921 Melena: Secondary | ICD-10-CM | POA: Diagnosis not present

## 2020-08-29 DIAGNOSIS — K922 Gastrointestinal hemorrhage, unspecified: Secondary | ICD-10-CM | POA: Diagnosis present

## 2020-08-29 DIAGNOSIS — Z8616 Personal history of COVID-19: Secondary | ICD-10-CM | POA: Diagnosis not present

## 2020-08-29 DIAGNOSIS — D500 Iron deficiency anemia secondary to blood loss (chronic): Secondary | ICD-10-CM | POA: Diagnosis not present

## 2020-08-29 DIAGNOSIS — Z87891 Personal history of nicotine dependence: Secondary | ICD-10-CM | POA: Insufficient documentation

## 2020-08-29 DIAGNOSIS — K625 Hemorrhage of anus and rectum: Secondary | ICD-10-CM | POA: Diagnosis not present

## 2020-08-29 HISTORY — PX: GIVENS CAPSULE STUDY: SHX5432

## 2020-08-29 LAB — HEMOGLOBIN AND HEMATOCRIT, BLOOD
HCT: 30.3 % — ABNORMAL LOW (ref 38.5–50.0)
Hemoglobin: 9.4 g/dL — ABNORMAL LOW (ref 13.2–17.1)

## 2020-08-29 SURGERY — IMAGING PROCEDURE, GI TRACT, INTRALUMINAL, VIA CAPSULE

## 2020-08-29 NOTE — H&P (Signed)
David Pruitt is an 75 y.o. male.   Chief Complaint: Patient is here for small bowel given capsule study. HPI: Patient is 75 year old Caucasian male who was hospitalized over 2 weeks ago with GI bleed and anemia.  He underwent EGD and colonoscopy in bleeding source was not identified.  Small bowel given capsule study therefore was recommended.  Patient was COVID-positive and therefore we recommended waiting for 10 days.  In the meantime his hemoglobin is coming up.  He is presently not taking p.o. iron.  Past Medical History:  Diagnosis Date   CKD (chronic kidney disease), stage IIIa ([place]) 08/15/2020   CVA (cerebral infarction)    2006   DM (dermatomyositis)    Gastritis    GERD (gastroesophageal reflux disease)    Hemorrhoids    Hypercholesteremia    Hypertension    Lower GI bleed    SECONDARY TO DIVERTICULA,S/P BLEED   Overweight (BMI 25.0-29.9) 08/15/2020   Shortness of breath dyspnea    Type 2 diabetes mellitus ([place])     Past Surgical History:  Procedure Laterality Date   COLONOSCOPY  12/2009   RMR: Lower GI bleed due to a single diverticulum status post Endo clipping.  Scattered diverticulosis.   COLONOSCOPY N/A 05/24/2014   Procedure: COLONOSCOPY;  Surgeon: Rogene Houston, MD;  [facility];  Service: Endoscopy;  Laterality: N/A;  1030   COLONOSCOPY N/A 03/15/2019   Diverticulosis, 5 polyps removed.  Pathology revealed tubular adenomas.  Next colonoscopy in 3 years.   COLONOSCOPY WITH PROPOFOL N/A 08/16/2020   Procedure: COLONOSCOPY WITH PROPOFOL;  Surgeon: Rogene Houston, MD;  [facility]: [facility];  Service: Endoscopy;  Laterality: N/A;   ESOPHAGOGASTRODUODENOSCOPY  12/2009   RMR: Mild esophagitis, gastritis, duodenitis.  Gastric biopsies negative for H. pylori   ESOPHAGOGASTRODUODENOSCOPY (EGD) WITH PROPOFOL N/A 08/15/2020   Procedure: ESOPHAGOGASTRODUODENOSCOPY (EGD) WITH PROPOFOL;  Surgeon: Daneil Dolin, MD;  [facility];  Service: Endoscopy;   Laterality: N/A;  covid + (asymptomatic)   POLYPECTOMY  03/15/2019   Procedure: POLYPECTOMY;  Surgeon: Daneil Dolin, MD;  [facility];  Service: Endoscopy;;    Family History  Problem Relation Age of Onset   Colon cancer Father    Stroke Father    Social History:  reports that he has quit smoking. His smoking use included cigarettes. He has a 40.00 pack-year smoking history. He has never used smokeless tobacco. He reports current alcohol use. He reports that he does not use drugs.  Allergies: No Known Allergies  No medications prior to admission.    No results found for this or any previous visit (from the past 48 hour(s)). No results found.  Review of Systems  Height '5\' 10"'$  (1.778 m), weight 90.7 kg. Physical Exam   Assessment/Plan  GI bleed of alcohol to origin. No source identified on EGD and colonoscopy. Small bowel given capsule study.  Hildred Laser, MD 08/29/2020, 9:44 AM

## 2020-08-30 ENCOUNTER — Telehealth (INDEPENDENT_AMBULATORY_CARE_PROVIDER_SITE_OTHER): Payer: Self-pay | Admitting: *Deleted

## 2020-08-30 ENCOUNTER — Encounter (HOSPITAL_COMMUNITY): Payer: Self-pay | Admitting: Internal Medicine

## 2020-08-30 NOTE — Telephone Encounter (Signed)
Wife Freda Munro called and states pt has seen dr Laural Golden for low hg. It was 9.4 on 8/18 and suppose to repeat one week. Need order put in and would like it mailed. Also needing work excuse. Has note from [place] through 19th. Need a new note starting the 20th through end of next week since he was told hb had to be at least 10 before going back to work. Would like work note mailed also. Aware dr Laural Golden out of office til Tuesday.

## 2020-09-02 ENCOUNTER — Other Ambulatory Visit (INDEPENDENT_AMBULATORY_CARE_PROVIDER_SITE_OTHER): Payer: Self-pay

## 2020-09-02 DIAGNOSIS — D500 Iron deficiency anemia secondary to blood loss (chronic): Secondary | ICD-10-CM

## 2020-09-02 NOTE — Op Note (Signed)
Small Bowel Givens Capsule Study Procedure date: 08/29/2020  Referring Provider: Barton Dubois, MD PCP:  Dr. Sharilyn Sites, MD  Indication for procedure:   Patient is 75 year old Caucasian male who was hospitalized week before last for GI bleed and anemia.  He underwent EGD revealing no source of GI blood loss.  He then underwent colonoscopy revealing sigmoid diverticulosis without stigmata of bleed.  He is therefore returning for small given capsule study looking for source of GI blood loss.  He experienced both melena and rectal bleeding.    Findings:    Patient was able to swallow given capsule study without any difficulty. Study duration is 8 hours and 26 seconds.  Is complete as given capsule reach: Within the study.. No abnormalities noted to mucosa of small bowel although in some areas mucosa was obscured by luminal contents. There was no fresh or old blood noted in small or large bowel.  First Gastric image: 25 sec First Duodenal image: 8 min and 34 sec First Ileo-Cecal Valve image: 2 hrs 47 min and 16 sec First Cecal image: 2 hrs 47 min and 38 sec Gastric Passage time: 7 min and 9 sec Small Bowel Passage time: 2 hrs and 39 min  Summary & Recommendations:  Normal small bowel study without any mucosal abnormalities; no fresh or old blood noted in the lumen. As noted above mucosal evaluation and some segments of small bowel was suboptimal because of luminal contents. Findings reviewed with patient and his wife. Will continue to follow H&H until it is normal. If he has another episode of overt GI bleed would consider GI bleeding scan and/or CT a abdomen and pelvis.

## 2020-09-03 ENCOUNTER — Encounter (INDEPENDENT_AMBULATORY_CARE_PROVIDER_SITE_OTHER): Payer: Self-pay | Admitting: Internal Medicine

## 2020-09-03 NOTE — Telephone Encounter (Signed)
Mitzie please mail work excuse from 8/20 through 8/28 per dr Laural Golden. Serena Colonel put in lab order yesterday.

## 2020-09-05 DIAGNOSIS — D500 Iron deficiency anemia secondary to blood loss (chronic): Secondary | ICD-10-CM | POA: Diagnosis not present

## 2020-09-06 LAB — HEMOGLOBIN AND HEMATOCRIT, BLOOD
HCT: 33.3 % — ABNORMAL LOW (ref 38.5–50.0)
Hemoglobin: 10 g/dL — ABNORMAL LOW (ref 13.2–17.1)

## 2020-09-09 ENCOUNTER — Telehealth (INDEPENDENT_AMBULATORY_CARE_PROVIDER_SITE_OTHER): Payer: Self-pay | Admitting: *Deleted

## 2020-09-09 NOTE — Telephone Encounter (Signed)
I advised to take just one today and we would address with Dr. Laural Golden and contact her with his suggestion.   Patient wife called in today stating the patient has been on Ferrrous sulfate and it has caused dizziness, nausea and shortness of breath. She states Dr.Rehman had told her if this occurred the patient may take Flintstone vitamin, but she is unsure if Dr. Laural Golden had said one or two per day.

## 2020-09-10 NOTE — Telephone Encounter (Signed)
Discussed with Dr. Laural Golden. Pt may start off taking one flintstone and if tolerated then take 2 daily but not to take together take one twice a day. Called and discussed with pt. Pt verbalized understanding.

## 2020-09-11 DIAGNOSIS — E782 Mixed hyperlipidemia: Secondary | ICD-10-CM | POA: Diagnosis not present

## 2020-09-11 DIAGNOSIS — K219 Gastro-esophageal reflux disease without esophagitis: Secondary | ICD-10-CM | POA: Diagnosis not present

## 2020-09-11 DIAGNOSIS — I10 Essential (primary) hypertension: Secondary | ICD-10-CM | POA: Diagnosis not present

## 2020-09-11 DIAGNOSIS — E1140 Type 2 diabetes mellitus with diabetic neuropathy, unspecified: Secondary | ICD-10-CM | POA: Diagnosis not present

## 2020-09-16 ENCOUNTER — Other Ambulatory Visit: Payer: Self-pay

## 2020-09-16 ENCOUNTER — Encounter (HOSPITAL_COMMUNITY): Payer: Self-pay | Admitting: *Deleted

## 2020-09-16 ENCOUNTER — Emergency Department (HOSPITAL_COMMUNITY): Payer: Medicare Other

## 2020-09-16 ENCOUNTER — Inpatient Hospital Stay (HOSPITAL_COMMUNITY)
Admission: EM | Admit: 2020-09-16 | Discharge: 2020-09-18 | DRG: 728 | Disposition: A | Discharge status: Home / Self Care | Payer: Medicare Other | Attending: Internal Medicine | Admitting: Internal Medicine

## 2020-09-16 DIAGNOSIS — T383X5A Adverse effect of insulin and oral hypoglycemic [antidiabetic] drugs, initial encounter: Secondary | ICD-10-CM | POA: Diagnosis present

## 2020-09-16 DIAGNOSIS — N1831 Chronic kidney disease, stage 3a: Secondary | ICD-10-CM

## 2020-09-16 DIAGNOSIS — N50812 Left testicular pain: Secondary | ICD-10-CM | POA: Diagnosis not present

## 2020-09-16 DIAGNOSIS — N453 Epididymo-orchitis: Principal | ICD-10-CM

## 2020-09-16 DIAGNOSIS — I10 Essential (primary) hypertension: Secondary | ICD-10-CM | POA: Diagnosis present

## 2020-09-16 DIAGNOSIS — N183 Chronic kidney disease, stage 3 unspecified: Secondary | ICD-10-CM | POA: Diagnosis present

## 2020-09-16 DIAGNOSIS — N451 Epididymitis: Secondary | ICD-10-CM | POA: Diagnosis not present

## 2020-09-16 DIAGNOSIS — N50819 Testicular pain, unspecified: Secondary | ICD-10-CM | POA: Diagnosis present

## 2020-09-16 DIAGNOSIS — R350 Frequency of micturition: Secondary | ICD-10-CM | POA: Diagnosis not present

## 2020-09-16 DIAGNOSIS — E1149 Type 2 diabetes mellitus with other diabetic neurological complication: Secondary | ICD-10-CM | POA: Diagnosis present

## 2020-09-16 DIAGNOSIS — E860 Dehydration: Secondary | ICD-10-CM | POA: Diagnosis present

## 2020-09-16 DIAGNOSIS — K219 Gastro-esophageal reflux disease without esophagitis: Secondary | ICD-10-CM | POA: Diagnosis present

## 2020-09-16 DIAGNOSIS — Z823 Family history of stroke: Secondary | ICD-10-CM

## 2020-09-16 DIAGNOSIS — I959 Hypotension, unspecified: Secondary | ICD-10-CM | POA: Diagnosis present

## 2020-09-16 DIAGNOSIS — E1122 Type 2 diabetes mellitus with diabetic chronic kidney disease: Secondary | ICD-10-CM | POA: Diagnosis present

## 2020-09-16 DIAGNOSIS — Z800 Family history of malignant neoplasm of digestive organs: Secondary | ICD-10-CM

## 2020-09-16 DIAGNOSIS — Z87891 Personal history of nicotine dependence: Secondary | ICD-10-CM

## 2020-09-16 DIAGNOSIS — Z7902 Long term (current) use of antithrombotics/antiplatelets: Secondary | ICD-10-CM

## 2020-09-16 DIAGNOSIS — E1165 Type 2 diabetes mellitus with hyperglycemia: Secondary | ICD-10-CM | POA: Diagnosis not present

## 2020-09-16 DIAGNOSIS — E7800 Pure hypercholesterolemia, unspecified: Secondary | ICD-10-CM | POA: Diagnosis present

## 2020-09-16 DIAGNOSIS — N433 Hydrocele, unspecified: Secondary | ICD-10-CM | POA: Diagnosis not present

## 2020-09-16 DIAGNOSIS — N492 Inflammatory disorders of scrotum: Secondary | ICD-10-CM | POA: Diagnosis not present

## 2020-09-16 DIAGNOSIS — R1032 Left lower quadrant pain: Secondary | ICD-10-CM

## 2020-09-16 DIAGNOSIS — Z79899 Other long term (current) drug therapy: Secondary | ICD-10-CM

## 2020-09-16 DIAGNOSIS — E663 Overweight: Secondary | ICD-10-CM | POA: Diagnosis present

## 2020-09-16 DIAGNOSIS — Z20822 Contact with and (suspected) exposure to covid-19: Secondary | ICD-10-CM | POA: Diagnosis present

## 2020-09-16 DIAGNOSIS — R1030 Lower abdominal pain, unspecified: Secondary | ICD-10-CM | POA: Diagnosis present

## 2020-09-16 DIAGNOSIS — I129 Hypertensive chronic kidney disease with stage 1 through stage 4 chronic kidney disease, or unspecified chronic kidney disease: Secondary | ICD-10-CM | POA: Diagnosis present

## 2020-09-16 DIAGNOSIS — R609 Edema, unspecified: Secondary | ICD-10-CM | POA: Diagnosis not present

## 2020-09-16 DIAGNOSIS — E538 Deficiency of other specified B group vitamins: Secondary | ICD-10-CM | POA: Diagnosis present

## 2020-09-16 DIAGNOSIS — K2100 Gastro-esophageal reflux disease with esophagitis, without bleeding: Secondary | ICD-10-CM

## 2020-09-16 DIAGNOSIS — N452 Orchitis: Secondary | ICD-10-CM | POA: Diagnosis not present

## 2020-09-16 DIAGNOSIS — Z7984 Long term (current) use of oral hypoglycemic drugs: Secondary | ICD-10-CM

## 2020-09-16 DIAGNOSIS — Z6828 Body mass index (BMI) 28.0-28.9, adult: Secondary | ICD-10-CM

## 2020-09-16 DIAGNOSIS — R110 Nausea: Secondary | ICD-10-CM | POA: Diagnosis present

## 2020-09-16 DIAGNOSIS — Z8673 Personal history of transient ischemic attack (TIA), and cerebral infarction without residual deficits: Secondary | ICD-10-CM

## 2020-09-16 LAB — COMPREHENSIVE METABOLIC PANEL
ALT: 25 U/L (ref 0–44)
AST: 26 U/L (ref 15–41)
Albumin: 3.7 g/dL (ref 3.5–5.0)
Alkaline Phosphatase: 59 U/L (ref 38–126)
Anion gap: 8 (ref 5–15)
BUN: 21 mg/dL (ref 8–23)
CO2: 23 mmol/L (ref 22–32)
Calcium: 8.5 mg/dL — ABNORMAL LOW (ref 8.9–10.3)
Chloride: 101 mmol/L (ref 98–111)
Creatinine, Ser: 1.63 mg/dL — ABNORMAL HIGH (ref 0.61–1.24)
GFR, Estimated: 44 mL/min — ABNORMAL LOW (ref >60)
Glucose, Bld: 192 mg/dL — ABNORMAL HIGH (ref 70–99)
Potassium: 4.1 mmol/L (ref 3.5–5.1)
Sodium: 132 mmol/L — ABNORMAL LOW (ref 135–145)
Total Bilirubin: 0.5 mg/dL (ref 0.3–1.2)
Total Protein: 7.2 g/dL (ref 6.5–8.1)

## 2020-09-16 LAB — CBC WITH DIFFERENTIAL/PLATELET
Abs Immature Granulocytes: 0.21 10*3/uL — ABNORMAL HIGH (ref 0.00–0.07)
Basophils Absolute: 0.1 10*3/uL (ref 0.0–0.1)
Basophils Relative: 0 %
Eosinophils Absolute: 0 10*3/uL (ref 0.0–0.5)
Eosinophils Relative: 0 %
HCT: 30.7 % — ABNORMAL LOW (ref 39.0–52.0)
Hemoglobin: 9.6 g/dL — ABNORMAL LOW (ref 13.0–17.0)
Immature Granulocytes: 1 %
Lymphocytes Relative: 6 %
Lymphs Abs: 1.4 10*3/uL (ref 0.7–4.0)
MCH: 27.9 pg (ref 26.0–34.0)
MCHC: 31.3 g/dL (ref 30.0–36.0)
MCV: 89.2 fL (ref 80.0–100.0)
Monocytes Absolute: 2.4 10*3/uL — ABNORMAL HIGH (ref 0.1–1.0)
Monocytes Relative: 10 %
Neutro Abs: 20.2 10*3/uL — ABNORMAL HIGH (ref 1.7–7.7)
Neutrophils Relative %: 83 %
Platelets: 291 10*3/uL (ref 150–400)
RBC: 3.44 MIL/uL — ABNORMAL LOW (ref 4.22–5.81)
RDW: 16 % — ABNORMAL HIGH (ref 11.5–15.5)
WBC: 24.3 10*3/uL — ABNORMAL HIGH (ref 4.0–10.5)
nRBC: 0 % (ref 0.0–0.2)

## 2020-09-16 LAB — URINALYSIS, ROUTINE W REFLEX MICROSCOPIC
Bilirubin Urine: NEGATIVE
Glucose, UA: >=500 mg/dL — AB
Hgb urine dipstick: NEGATIVE
Ketones, ur: NEGATIVE mg/dL
Nitrite: POSITIVE — AB
Specific Gravity, Urine: 1.015 (ref 1.005–1.030)
pH: 5.5 (ref 5.0–8.0)

## 2020-09-16 LAB — RESP PANEL BY RT-PCR (FLU A&B, COVID) ARPGX2
Influenza A by PCR: NEGATIVE
Influenza B by PCR: NEGATIVE
SARS Coronavirus 2 by RT PCR: NEGATIVE

## 2020-09-16 LAB — URINALYSIS, MICROSCOPIC (REFLEX)

## 2020-09-16 LAB — GLUCOSE, CAPILLARY
Glucose-Capillary: 200 mg/dL — ABNORMAL HIGH (ref 70–99)
Glucose-Capillary: 151 mg/dL — ABNORMAL HIGH (ref 70–99)

## 2020-09-16 MED ORDER — RISAQUAD PO CAPS
2.0000 | ORAL_CAPSULE | Freq: Every day | ORAL | Status: DC
  Administered 2020-09-16 – 2020-09-18 (×3): 2 via ORAL
  Filled 2020-09-16 (×3): qty 2

## 2020-09-16 MED ORDER — HEPARIN SODIUM (PORCINE) 5000 UNIT/ML IJ SOLN
5000.0000 [IU] | Freq: Three times a day (TID) | INTRAMUSCULAR | Status: DC
  Administered 2020-09-16 – 2020-09-18 (×6): 5000 [IU] via SUBCUTANEOUS
  Filled 2020-09-16 (×6): qty 1

## 2020-09-16 MED ORDER — LORATADINE 10 MG PO TABS: 10.0000 mg | ORAL_TABLET | Freq: Every day | ORAL | Status: DC | PRN

## 2020-09-16 MED ORDER — SODIUM CHLORIDE 0.9 % IV SOLN: INTRAVENOUS | Status: DC

## 2020-09-16 MED ORDER — ONDANSETRON 4 MG PO TBDP
4.0000 mg | ORAL_TABLET | Freq: Once | ORAL | Status: AC
  Administered 2020-09-16: 4 mg via ORAL
  Filled 2020-09-16: qty 1

## 2020-09-16 MED ORDER — BACID PO TABS
1.0000 | ORAL_TABLET | Freq: Every day | ORAL | Status: DC
  Filled 2020-09-16 (×3): qty 1

## 2020-09-16 MED ORDER — ACETAMINOPHEN 650 MG RE SUPP: 650.0000 mg | Freq: Four times a day (QID) | RECTAL | Status: DC

## 2020-09-16 MED ORDER — LEVOFLOXACIN 500 MG PO TABS
500.0000 mg | ORAL_TABLET | Freq: Once | ORAL | Status: AC
  Administered 2020-09-16: 500 mg via ORAL
  Filled 2020-09-16: qty 1

## 2020-09-16 MED ORDER — TRAZODONE HCL 50 MG PO TABS: 50.0000 mg | ORAL_TABLET | Freq: Every evening | ORAL | Status: DC | PRN

## 2020-09-16 MED ORDER — INSULIN ASPART 100 UNIT/ML IJ SOLN
0.0000 [IU] | Freq: Three times a day (TID) | INTRAMUSCULAR | Status: DC
  Administered 2020-09-16: 2 [IU] via SUBCUTANEOUS
  Administered 2020-09-17: 5 [IU] via SUBCUTANEOUS
  Administered 2020-09-17 – 2020-09-18 (×3): 2 [IU] via SUBCUTANEOUS

## 2020-09-16 MED ORDER — BACID PO TABS: 1.0000 | ORAL_TABLET | Freq: Two times a day (BID) | ORAL | Status: DC

## 2020-09-16 MED ORDER — INSULIN ASPART 100 UNIT/ML IJ SOLN
3.0000 [IU] | Freq: Three times a day (TID) | INTRAMUSCULAR | Status: DC
  Administered 2020-09-16 – 2020-09-18 (×5): 3 [IU] via SUBCUTANEOUS

## 2020-09-16 MED ORDER — ONDANSETRON HCL 4 MG/2ML IJ SOLN: 4.0000 mg | Freq: Four times a day (QID) | INTRAMUSCULAR | Status: DC | PRN

## 2020-09-16 MED ORDER — VITAMIN B-12 1000 MCG PO TABS
1000.0000 ug | ORAL_TABLET | Freq: Every day | ORAL | Status: DC
  Administered 2020-09-17 – 2020-09-18 (×2): 1000 ug via ORAL
  Filled 2020-09-16 (×2): qty 1

## 2020-09-16 MED ORDER — ACETAMINOPHEN 325 MG PO TABS
650.0000 mg | ORAL_TABLET | Freq: Four times a day (QID) | ORAL | Status: DC
  Administered 2020-09-16 – 2020-09-18 (×8): 650 mg via ORAL
  Filled 2020-09-16 (×8): qty 2

## 2020-09-16 MED ORDER — SIMVASTATIN 20 MG PO TABS
20.0000 mg | ORAL_TABLET | Freq: Every day | ORAL | Status: DC
  Administered 2020-09-16 – 2020-09-17 (×2): 20 mg via ORAL
  Filled 2020-09-16 (×2): qty 1

## 2020-09-16 MED ORDER — ONDANSETRON HCL 4 MG PO TABS: 4.0000 mg | ORAL_TABLET | Freq: Four times a day (QID) | ORAL | Status: DC | PRN

## 2020-09-16 MED ORDER — CLOPIDOGREL BISULFATE 75 MG PO TABS
75.0000 mg | ORAL_TABLET | Freq: Every day | ORAL | Status: DC
  Administered 2020-09-16 – 2020-09-18 (×3): 75 mg via ORAL
  Filled 2020-09-16 (×3): qty 1

## 2020-09-16 MED ORDER — LEVOFLOXACIN IN D5W 750 MG/150ML IV SOLN: 750.0000 mg | INTRAVENOUS | Status: DC

## 2020-09-16 MED ORDER — BISACODYL 5 MG PO TBEC: 5.0000 mg | DELAYED_RELEASE_TABLET | Freq: Every day | ORAL | Status: DC | PRN

## 2020-09-16 MED ORDER — INSULIN ASPART 100 UNIT/ML IJ SOLN: 0.0000 [IU] | Freq: Every day | INTRAMUSCULAR | Status: DC

## 2020-09-16 MED ORDER — ACETAMINOPHEN 500 MG PO TABS
1000.0000 mg | ORAL_TABLET | Freq: Once | ORAL | Status: AC
  Administered 2020-09-16: 1000 mg via ORAL
  Filled 2020-09-16: qty 2

## 2020-09-16 MED ORDER — OXYCODONE HCL 5 MG PO TABS: 5.0000 mg | ORAL_TABLET | Freq: Four times a day (QID) | ORAL | Status: DC | PRN

## 2020-09-16 MED ORDER — MAGNESIUM OXIDE -MG SUPPLEMENT 400 (240 MG) MG PO TABS
400.0000 mg | ORAL_TABLET | Freq: Every day | ORAL | Status: DC
  Administered 2020-09-17 – 2020-09-18 (×2): 400 mg via ORAL
  Filled 2020-09-16 (×2): qty 1

## 2020-09-16 MED ORDER — LACTATED RINGERS IV BOLUS
1000.0000 mL | Freq: Once | INTRAVENOUS | Status: AC
  Administered 2020-09-16: 1000 mL via INTRAVENOUS

## 2020-09-16 MED ORDER — PANTOPRAZOLE SODIUM 40 MG PO TBEC
40.0000 mg | DELAYED_RELEASE_TABLET | Freq: Every day | ORAL | Status: DC
  Administered 2020-09-16 – 2020-09-18 (×3): 40 mg via ORAL
  Filled 2020-09-16 (×3): qty 1

## 2020-09-16 MED ORDER — ASCORBIC ACID 500 MG PO TABS
1000.0000 mg | ORAL_TABLET | Freq: Two times a day (BID) | ORAL | Status: DC
  Administered 2020-09-16 – 2020-09-18 (×4): 1000 mg via ORAL
  Filled 2020-09-16 (×4): qty 2

## 2020-09-16 NOTE — ED Triage Notes (Signed)
Pt c/o groin pain and swelling, worse on left side, nausea, weakness, urinary frequency, low grade temperature x 3-4 days.

## 2020-09-16 NOTE — ED Provider Notes (Signed)
[facility] EMERGENCY DEPARTMENT Provider Note   CSN: [id number] Arrival date & time: 09/16/20  [id number]     History Chief Complaint  Patient presents with   Groin Pain    David Pruitt is a 75 y.o. male with PMH CKD 3, previous CVA, dermatomyositis, T2DM who presents to the emergency department for evaluation of groin pain and testicular swelling.  Patient states for the last 4 days he has had pain in his left testicle with asymmetric swelling and associated dysuria and increased frequency.  Denies penile discharge or new rash.  Also endorses intermittent nausea.  Denies chest pain, shortness of breath, abdominal pain, vomiting, night sweats, chills or other systemic symptoms.   Groin Pain Pertinent negatives include no chest pain, no abdominal pain and no shortness of breath.      Past Medical History:  Diagnosis Date   CKD (chronic kidney disease), stage IIIa ([place]) 08/15/2020   CVA (cerebral infarction)    2006   DM (dermatomyositis)    Gastritis    GERD (gastroesophageal reflux disease)    Hemorrhoids    Hypercholesteremia    Hypertension    Lower GI bleed    SECONDARY TO DIVERTICULA,S/P BLEED   Overweight (BMI 25.0-29.9) 08/15/2020   Shortness of breath dyspnea    Type 2 diabetes mellitus [facility])     Patient Active Problem List   Diagnosis Date Noted   Class 1 obesity due to excess calories with body mass index (BMI) of 30.0 to 30.9 in adult    COVID-19 virus infection    CKD (chronic kidney disease), stage IIIa ([place]) 08/15/2020   Overweight (BMI 25.0-29.9) 08/15/2020   Hypertension    Hypercholesteremia    Type 2 diabetes mellitus with hyperglycemia ([place])    Type 2 diabetes mellitus ([place])    Gastrointestinal hemorrhage with melena 08/14/2020   Rectal bleeding 10/25/2019   Diverticulitis of colon 10/25/2019   History of GI diverticular bleed 12/07/2018   Lower GI bleed 12/07/2018   B12 deficiency 12/07/2018   Abnormal CT scan, sigmoid colon 12/07/2018   Acute  blood loss anemia 12/07/2018   GERD (gastroesophageal reflux disease) 01/16/2010   GASTROINTESTINAL HEMORRHAGE 01/16/2010    Past Surgical History:  Procedure Laterality Date   COLONOSCOPY  12/2009   RMR: Lower GI bleed due to a single diverticulum status post Endo clipping.  Scattered diverticulosis.   COLONOSCOPY N/A 05/24/2014   Procedure: COLONOSCOPY;  Surgeon: Rogene Houston, MD;  Location: [facility] ENDO SUITE;  Service: Endoscopy;  Laterality: N/A;  1030   COLONOSCOPY N/A 03/15/2019   Diverticulosis, 5 polyps removed.  Pathology revealed tubular adenomas.  Next colonoscopy in 3 years.   COLONOSCOPY WITH PROPOFOL N/A 08/16/2020   Procedure: COLONOSCOPY WITH PROPOFOL;  Surgeon: Rogene Houston, MD;  Location: [facility] ENDO SUITE;  Service: Endoscopy;  Laterality: N/A;   ESOPHAGOGASTRODUODENOSCOPY  12/2009   RMR: Mild esophagitis, gastritis, duodenitis.  Gastric biopsies negative for H. pylori   ESOPHAGOGASTRODUODENOSCOPY (EGD) WITH PROPOFOL N/A 08/15/2020   Procedure: ESOPHAGOGASTRODUODENOSCOPY (EGD) WITH PROPOFOL;  Surgeon: Daneil Dolin, MD;  Location: [facility] ENDO SUITE;  Service: Endoscopy;  Laterality: N/A;  covid + (asymptomatic)   GIVENS CAPSULE STUDY N/A 08/29/2020   Procedure: GIVENS CAPSULE STUDY;  Surgeon: Rogene Houston, MD;  Location: [facility] ENDO SU[facility];  Service: Endoscopy;  Laterality: N/A;   POLYPECTOMY  03/15/2019   Procedure: POLYPECTOMY;  Surgeon: Daneil Dolin, MD;  Location: [facility] ENDO SUITE;  Service: Endoscopy;;  Family History  Problem Relation Age of Onset   Colon cancer Father    Stroke Father     Social History   Tobacco Use   Smoking status: Former    Packs/day: 1.00    Years: 40.00    Pack years: 40.00    Types: Cigarettes   Smokeless tobacco: Never  Vaping Use   Vaping Use: Never used  Substance Use Topics   Alcohol use: Yes    Comment: occasionally   Drug use: No    Home Medications Prior to Admission medications   Medication Sig Start Date End Date  Taking? Authorizing Provider  acetaminophen (TYLENOL) 500 MG tablet Take 1,000 mg by mouth every 6 (six) hours as needed for headache.     [provider]  ascorbic acid (VITAMIN C) 1000 MG tablet Take 1,000 mg by mouth in the morning and at bedtime.    [provider]  clopidogrel (PLAVIX) 75 MG tablet Take 1 tablet (75 mg total) by mouth daily. 08/19/20   Barton Dubois, MD  glipiZIDE (GLUCOTROL XL) 5 MG 24 hr tablet Take 5 mg by mouth daily.     [provider]  glucosamine-chondroitin 500-400 MG tablet Take 1 tablet by mouth daily.    [provider]  lisinopril (PRINIVIL,ZESTRIL) 10 MG tablet Take 10 mg by mouth daily.    [provider]  loratadine (CLARITIN) 10 MG tablet Take 10 mg daily by mouth.    [provider]  metoprolol succinate (TOPROL-XL) 50 MG 24 hr tablet Take 50 mg by mouth daily. 10/23/19   [provider]  Multiple Vitamins-Minerals (MULTIVITAMIN WITH MINERALS) tablet Take 1 tablet by mouth daily.    [provider]  pantoprazole (PROTONIX) 40 MG tablet Take 40 mg by mouth daily.    [provider]  Probiotic, Lactobacillus, CAPS Take 1 capsule by mouth daily.    [provider]  simvastatin (ZOCOR) 20 MG tablet Take 20 mg by mouth at bedtime. 10/14/18   [provider]  sitaGLIPtin-metformin (JANUMET) 50-1000 MG tablet Take 1 tablet 2 (two) times daily with a meal by mouth.    [provider]  vitamin B-12 (CYANOCOBALAMIN) 1000 MCG tablet Take 1,000 mcg daily by mouth.    [provider]    Allergies    Patient has no known allergies.  Review of Systems   Review of Systems  Constitutional:  Negative for chills and fever.  HENT:  Negative for ear pain and sore throat.   Eyes:  Negative for pain and visual disturbance.  Respiratory:  Negative for cough and shortness of breath.   Cardiovascular:  Negative for chest pain and palpitations.  Gastrointestinal:   Negative for abdominal pain and vomiting.  Genitourinary:  Positive for dysuria, scrotal swelling and testicular pain. Negative for hematuria.  Musculoskeletal:  Negative for arthralgias and back pain.  Skin:  Negative for color change and rash.  Neurological:  Negative for seizures and syncope.  All other systems reviewed and are negative.  Physical Exam Updated Vital Signs BP 108/68   Pulse 98   Temp 99.2 F (37.3 C) (Oral)   Resp 19   Ht '5\' 10"'$  (1.778 m)   Wt 90.7 kg   SpO2 96%   BMI 28.70 kg/m   Physical Exam Vitals and nursing note reviewed.  Constitutional:      Appearance: He is well-developed.  HENT:     Head: Normocephalic and atraumatic.  Eyes:     Conjunctiva/sclera:  Conjunctivae normal.  Cardiovascular:     Rate and Rhythm: Normal rate and regular rhythm.     Heart sounds: No murmur heard. Pulmonary:     Effort: Pulmonary effort is normal. No respiratory distress.     Breath sounds: Normal breath sounds.  Abdominal:     Palpations: Abdomen is soft.     Tenderness: There is no abdominal tenderness.  Genitourinary:    Comments: Asymmetric testicular swelling, left significantly greater than right  Musculoskeletal:     Cervical back: Neck supple.  Skin:    General: Skin is warm and dry.  Neurological:     Mental Status: He is alert.    ED Results / Procedures / Treatments   Labs (all labs ordered are listed, but only abnormal results are displayed) Labs Reviewed - No data to display  EKG None  Radiology No results found.  Procedures Procedures   Medications Ordered in ED Medications - No data to display  ED Course  I have reviewed the triage vital signs and the nursing notes.  Pertinent labs & imaging results that were available during my care of the patient were reviewed by me and considered in my medical decision making (see chart for details).    MDM Rules/Calculators/A&P                           Patient seen the emergency  department for evaluation of left testicular pain and swelling, groin pain.  Physical exam reveals a asymmetric swelling of the left testicle that is tender to palpation with tenderness extending into the groin.  No appreciable abdominal pain.  Cardiopulmonary exam unremarkable.  Laboratory evaluation reveals a significant leukocytosis to 24.3, hemoglobin 9.6, hyponatremia 132, creatinine elevation to 1.63.  Urinalysis concerning for cystitis.  Ultrasound concerning for epididymoorchitis.  Patient initially started on Levaquin, but started to become hypotensive with systolics down in the [id number] and due to concern for patient's high risk of bacteremia, patient will be admitted for observation.  Cultures drawn and patient admitted. Final Clinical Impression(s) / ED Diagnoses Final diagnoses:  None    Rx / DC Orders ED Discharge Orders     None        Kimimila Tauzin, MD 09/16/20 1616

## 2020-09-16 NOTE — H&P (Addendum)
History and Physical  David Pruitt  SAL SWEETEN [id number] DOB: Jan 01, 1946 DOA: 09/16/2020  PCP: David Sites, MD  Patient coming from: home  Level of care: Med-Surg  I have personally briefly reviewed patient's old medical records in [place]  Chief Complaint: groin pain   HPI: David Pruitt is a 75 y.o. male with medical history significant for new type 2 diabetes mellitus, hypertension, stage IIIa CKD, cerebrovascular disease, GERD, hyperlipidemia, GI bleeding, former smoker who reports that he was started on Farxiga 2 weeks ago.  He reports that his blood sugars have improved significantly since starting the medication.  He reports that with the last 4 days he has had increasing pain in the left testicle and asymmetric swelling of the testicles associated with increased urinary frequency and dysuria.  He reports having a fever at home.  He denies having chills.  He denies shortness of breath.  He reports groin pain.  No hematuria.    ED Course: Temperature 99.2, pulse 92, respirations 24, blood pressure 110/58, sodium 132, potassium 4.1, glucose 192, BUN 21, creatinine 1.63, calcium 8.5, total bilirubin 0.5, WBC 24.3, hemoglobin 9.6, 291.  Urinalysis abnormal with greater than 500 glucose, nitrite positive, protein trace, bacteria many, WBC 6-10.  Scrotal scrotal ultrasound reported as Heterogeneity and hyperemia of the left epididymis with a moderate left hydrocele is concerning for left epididymitis-orchitis.    Review of Systems: Review of Systems  Constitutional:  Positive for fever and malaise/fatigue. Negative for chills.  HENT: Negative.    Eyes: Negative.   Respiratory: Negative.    Cardiovascular: Negative.   Gastrointestinal: Negative.   Genitourinary:  Positive for dysuria, frequency and urgency.       Groin/testicular pain  Skin: Negative.   Neurological: Negative.   Endo/Heme/Allergies: Negative.   Psychiatric/Behavioral: Negative.    All other  systems reviewed and are negative.   Past Medical History:  Diagnosis Date   CKD (chronic kidney disease), stage IIIa ([place]) 08/15/2020   CVA (cerebral infarction)    2006   DM (dermatomyositis)    Gastritis    GERD (gastroesophageal reflux disease)    Hemorrhoids    Hypercholesteremia    Hypertension    Lower GI bleed    SECONDARY TO DIVERTICULA,S/P BLEED   Overweight (BMI 25.0-29.9) 08/15/2020   Shortness of breath dyspnea    Type 2 diabetes mellitus ([place])     Past Surgical History:  Procedure Laterality Date   COLONOSCOPY  12/2009   RMR: Lower GI bleed due to a single diverticulum status post Endo clipping.  Scattered diverticulosis.   COLONOSCOPY N/A 05/24/2014   Procedure: COLONOSCOPY;  Surgeon: David Houston, MD;  Location: [facility];  Service: Endoscopy;  Laterality: N/A;  1030   COLONOSCOPY N/A 03/15/2019   Diverticulosis, 5 polyps removed.  Pathology revealed tubular adenomas.  Next colonoscopy in 3 years.   COLONOSCOPY WITH PROPOFOL N/A 08/16/2020   Procedure: COLONOSCOPY WITH PROPOFOL;  Surgeon: David Houston, MD;  Location: [facility];  Service: Endoscopy;  Laterality: N/A;   ESOPHAGOGASTRODUODENOSCOPY  12/2009   RMR: Mild esophagitis, gastritis, duodenitis.  Gastric biopsies negative for H. pylori   ESOPHAGOGASTRODUODENOSCOPY (EGD) WITH PROPOFOL N/A 08/15/2020   Procedure: ESOPHAGOGASTRODUODENOSCOPY (EGD) WITH PROPOFOL;  Surgeon: David Dolin, MD;  Location[facility];  Service: Endoscopy;  Laterality: N/A;  covid + (asymptomatic)   GIVENS CAPSULE STUDY N/A 08/29/2020   Procedure: GIVENS CAPSULE STUDY;  Surgeon: David Houston, MD;  Location: [facility];  Service: Endoscopy;  Laterality: N/A;   POLYPECTOMY  03/15/2019   Procedure: POLYPECTOMY;  Surgeon: David Dolin, MD;  Location: [facility];  Service: Endoscopy;;     reports that he has quit smoking. His smoking use included cigarettes. He has a 40.00 pack-year smoking history. He has never used  smokeless tobacco. He reports current alcohol use. He reports that he does not use drugs.  No Known Allergies  Family History  Problem Relation Age of Onset   Colon cancer Father    Stroke Father     Prior to Admission medications   Medication Sig Start Date End Date Taking? Authorizing Provider  acetaminophen (TYLENOL) 500 MG tablet Take 1,000 mg by mouth every 6 (six) hours as needed for headache.     [provider]  ascorbic acid (VITAMIN C) 1000 MG tablet Take 1,000 mg by mouth in the morning and at bedtime.    [provider]  clopidogrel (PLAVIX) 75 MG tablet Take 1 tablet (75 mg total) by mouth daily. 08/19/20   David Dubois, MD  glipiZIDE (GLUCOTROL XL) 5 MG 24 hr tablet Take 5 mg by mouth daily.     [provider]  glucosamine-chondroitin 500-400 MG tablet Take 1 tablet by mouth daily.    [provider]  lisinopril (PRINIVIL,ZESTRIL) 10 MG tablet Take 10 mg by mouth daily.    [provider]  loratadine (CLARITIN) 10 MG tablet Take 10 mg daily by mouth.    [provider]  metoprolol succinate (TOPROL-XL) 50 MG 24 hr tablet Take 50 mg by mouth daily. 10/23/19   [provider]  Multiple Vitamins-Minerals (MULTIVITAMIN WITH MINERALS) tablet Take 1 tablet by mouth daily.    [provider]  pantoprazole (PROTONIX) 40 MG tablet Take 40 mg by mouth daily.    [provider]  Probiotic, Lactobacillus, CAPS Take 1 capsule by mouth daily.    [provider]  simvastatin (ZOCOR) 20 MG tablet Take 20 mg by mouth at bedtime. 10/14/18   [provider]  sitaGLIPtin-metformin (JANUMET) 50-1000 MG tablet Take 1 tablet 2 (two) times daily with a meal by mouth.    [provider]  vitamin B-12 (CYANOCOBALAMIN) 1000 MCG tablet Take 1,000 mcg daily by mouth.    [provider]    Physical Exam: Vitals:   09/16/20 1106 09/16/20 1130 09/16/20 1200 09/16/20 1230  BP: 92/61 (!)  96/58 (!) 99/58 98/64  Pulse: 89 84 81 86  Resp: (!) 30 (!) '24 19 18  '$ Temp:      TempSrc:      SpO2: 96% 97% 98% 99%  Weight:      Height:       Constitutional: awake, alert, NAD, calm, comfortable Eyes: PERRL, lids and conjunctivae normal ENMT: Mucous membranes are moist. Posterior pharynx clear of any exudate or lesions.  Normal dentition.  Neck: normal, supple, no masses, no thyromegaly Respiratory: clear to auscultation bilaterally, no wheezing, no crackles. Normal respiratory effort. No accessory muscle use.  Cardiovascular: normal s1, s2 sounds, no murmurs / rubs / gallops. No extremity edema. 2+ pedal pulses. No carotid bruits.  Abdomen: no tenderness, no masses palpated. No hepatosplenomegaly. Bowel sounds positive.  Musculoskeletal: no clubbing / cyanosis. No joint deformity upper and lower extremities. Good ROM, no contractures. Normal muscle tone.  GU: swollen tender left testicle, normal size right testicle, no groin lymphadenopathy, no penile discharge seen.   Skin: no rashes, lesions, ulcers.  No induration Neurologic: CN 2-12 grossly intact. Sensation intact, DTR normal. Strength 5/5 in all 4.  Psychiatric: Normal judgment and insight. Alert and oriented x 3. Normal mood.   Labs on Admission: I have personally reviewed following labs and imaging studies  CBC: Recent Labs  Lab 09/16/20 1140  WBC 24.3*  NEUTROABS 20.2*  HGB 9.6*  HCT 30.7*  MCV 89.2  PLT [id number]   Basic Metabolic Panel: Recent Labs  Lab 09/16/20 1140  NA 132*  K 4.1  CL 101  CO2 23  GLUCOSE 192*  BUN 21  CREATININE 1.63*  CALCIUM 8.5*   GFR: Estimated Creatinine Clearance: 45.7 mL/min (A) (by C-G formula based on SCr of 1.63 mg/dL (H)). Liver Function Tests: Recent Labs  Lab 09/16/20 1140  AST 26  ALT 25  ALKPHOS 59  BILITOT 0.5  PROT 7.2  ALBUMIN 3.7   No results for input(s): LIPASE, AMYLASE in the last 168 hours. No results for input(s): AMMONIA in the last 168  hours. Coagulation Profile: No results for input(s): INR, PROTIME in the last 168 hours. Cardiac Enzymes: No results for input(s): CKTOTAL, CKMB, CKMBINDEX, TROPONINI in the last 168 hours. BNP (last 3 results) No results for input(s): PROBNP in the last 8760 hours. HbA1C: No results for input(s): HGBA1C in the last 72 hours. CBG: No results for input(s): GLUCAP in the last 168 hours. Lipid Profile: No results for input(s): CHOL, HDL, LDLCALC, TRIG, CHOLHDL, LDLDIRECT in the last 72 hours. Thyroid Function Tests: No results for input(s): TSH, T4TOTAL, FREET4, T3FREE, THYROIDAB in the last 72 hours. Anemia Panel: No results for input(s): VITAMINB12, FOLATE, FERRITIN, TIBC, IRON, RETICCTPCT in the last 72 hours. Urine analysis:    Component Value Date/Time   COLORURINE YELLOW 09/16/2020 0818   APPEARANCEUR CLEAR 09/16/2020 0818   LABSPEC 1.015 09/16/2020 0818   PHURINE 5.5 09/16/2020 0818   GLUCOSEU >=500 (A) 09/16/2020 0818   HGBUR NEGATIVE 09/16/2020 0818   BILIRUBINUR NEGATIVE 09/16/2020 0818   KETONESUR NEGATIVE 09/16/2020 0818   PROTEINUR TRACE (A) 09/16/2020 0818   NITRITE POSITIVE (A) 09/16/2020 0818   LEUKOCYTESUR TRACE (A) 09/16/2020 0818    Radiological Exams on Admission: US SCROTUM W/DOPPLER  Result Date: 09/16/2020 CLINICAL DATA:  Left testicular swelling for 4 days. Increased urinary frequency and low-grade temperature. EXAM: SCROTAL ULTRASOUND DOPPLER ULTRASOUND OF THE TESTICLES TECHNIQUE: Complete ultrasound examination of the testicles, epididymis, and other scrotal structures was performed. Color and spectral Doppler ultrasound were also utilized to evaluate blood flow to the testicles. COMPARISON:  None. FINDINGS: Right testicle Measurements: 5.1 x 2.5 x 4.0 cm. No mass or microlithiasis visualized. Left testicle Measurements: 4.7 x 3.0 x 3.6 cm. No mass or microlithiasis visualized. Right epididymis:  Heterogeneous in appearance. Left epididymis: Heterogeneous in  appearance with mild hyperemia compared to the right. Hydrocele:  Left hydrocele greater than right. Varicocele:  None visualized. Pulsed Doppler interrogation of both testes demonstrates normal low resistance arterial and venous waveforms bilaterally. IMPRESSION: Heterogeneity and hyperemia of the left epididymis with a moderate left hydrocele is concerning for left epididymitis-orchitis. Electronically Signed   By: Zerita Boers M.D.   On: 09/16/2020 10:05    Assessment/Plan Principal Problem:   Orchitis and epididymitis Active Problems:   GERD (gastroesophageal reflux disease)   B12 deficiency   Hypertension   Hypercholesteremia   Type 2 diabetes mellitus with hyperglycemia ([facility])   CKD (chronic kidney disease), stage IIIa ([facility])   Overweight (BMI 25.0-29.9)   Groin pain  Testicular pain   Nausea   Left orchitis and epididymitis-patient presenting with some signs concerning for bacteremia and will be admitted into the hospital for further evaluation and antibiotic therapy.  Levaquin ordered per pharmacy recommendations.  Blood cultures x2.  Supportive care.  Leukocytosis - secondary to infection, follow BC and UC, treat as above, recheck CBC/diff in AM.   Hypotension-likely secondary to dehydration, treating with IV fluid bolus and continuous infusion.  Hold home blood pressure medications temporarily.  Essential hypertension-given current hypotension temporarily holding home blood pressure medications.  Stage IIIa CKD-stable and following.  Renally dose medications as appropriate.  Groin pain and testicular pain-Tylenol ordered as needed for symptoms.  Treating condition with IV antibiotics as ordered.  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  I spoke with the patient and counseled with him and his wife at the bedside.  We will hold the medication at this time.  Will discuss with his  PCP if he would like to continue taking her medication understanding the risk of recurrent GU infections.  Patient verbalizes understanding.  In the hospital we will treat with supplemental sliding scale coverage CBG monitoring.  GERD - protonix ordered for GI protection.     DVT prophylaxis: SQ heparin   Code Status: Full   Family Communication: wife at bedside updated 9/5  Disposition Plan: anticipate DC home   Consults called: n/a  Admission status: OBV  Level of care: Med-Surg Irwin Brakeman MD [facility] How to contact the [facility] Attending or Consulting provider [place] or covering provider during after hours [place], for this patient?  Check the care team in [facility] and look for a) attending/consulting [facility] provider listed and b) the [facility] team listed Log into [email or web address] and use [place]'s universal password to access. If you do not have the password, please contact the hospital operator. Locate the [facility] provider you are looking for under [facility] and page to a number that you can be directly reached. If you still have difficulty reaching the provider, please page the [facility] (Director on Call) for the Hospitalists listed on amion for assistance.   If 7PM-7AM, please contact night-coverage [email or web address] Password [email or web address]  09/16/2020, 1:05 PM

## 2020-09-16 NOTE — Progress Notes (Signed)
Pharmacy Antibiotic Note  David Pruitt a 75 y.o. male admitted on 09/16/2020 with  orchitis and epididymitis .  Pharmacy has been consulted for levaquin dosing.  Plan: Levofloxacin '750mg'$  iv q48h  Medical History: Past Medical History:  Diagnosis Date   CKD (chronic kidney disease), stage IIIa ([place]) 08/15/2020   CVA (cerebral infarction)    2006   DM (dermatomyositis)    Gastritis    GERD (gastroesophageal reflux disease)    Hemorrhoids    Hypercholesteremia    Hypertension    Lower GI bleed    SECONDARY TO DIVERTICULA,S/P BLEED   Overweight (BMI 25.0-29.9) 08/15/2020   Shortness of breath dyspnea    Type 2 diabetes mellitus (H[facility])     Allergies:  No Known Allergies  Filed Weights   09/16/20 0740  Weight: 90.7 kg (200 lb)    CBC Latest Ref Rng & Units 09/16/2020 09/05/2020 08/29/2020  WBC 4.0 - 10.5 K/uL 24.3(H) - -  Hemoglobin 13.0 - 17.0 g/dL 9.6(L) 10.0(L) 9.4(L)  Hematocrit 39.0 - 52.0 % 30.7(L) 33.3(L) 30.3(L)  Platelets 150 - 400 K/uL 291 - -     Estimated Creatinine Clearance: 45.7 mL/min (A) (by C-G formula based on SCr of 1.63 mg/dL (H)).  Antibiotics Given (last 72 hours)     Date/Time Action Medication Dose   09/16/20 1125 Given   levofloxacin (LEVAQUIN) tablet 500 mg 500 mg       Antimicrobials this admission:  levaquin 09/16/2020  >>   Microbiology results: 09/16/2020  BCx: sent 09/16/2020  UCx: sent 09/16/2020  Resp Panel: sent  09/16/2020  MRSA PCR: sent  Thank you for allowing pharmacy to be a part of this patient's care.  Thomasenia Sales, PharmD Clinical Pharmacist

## 2020-09-17 DIAGNOSIS — T383X5A Adverse effect of insulin and oral hypoglycemic [antidiabetic] drugs, initial encounter: Secondary | ICD-10-CM | POA: Diagnosis present

## 2020-09-17 DIAGNOSIS — Z87891 Personal history of nicotine dependence: Secondary | ICD-10-CM | POA: Diagnosis not present

## 2020-09-17 DIAGNOSIS — I129 Hypertensive chronic kidney disease with stage 1 through stage 4 chronic kidney disease, or unspecified chronic kidney disease: Secondary | ICD-10-CM | POA: Diagnosis present

## 2020-09-17 DIAGNOSIS — E663 Overweight: Secondary | ICD-10-CM | POA: Diagnosis present

## 2020-09-17 DIAGNOSIS — I959 Hypotension, unspecified: Secondary | ICD-10-CM | POA: Diagnosis present

## 2020-09-17 DIAGNOSIS — Z20822 Contact with and (suspected) exposure to covid-19: Secondary | ICD-10-CM | POA: Diagnosis present

## 2020-09-17 DIAGNOSIS — E538 Deficiency of other specified B group vitamins: Secondary | ICD-10-CM | POA: Diagnosis present

## 2020-09-17 DIAGNOSIS — R609 Edema, unspecified: Secondary | ICD-10-CM | POA: Diagnosis not present

## 2020-09-17 DIAGNOSIS — N1831 Chronic kidney disease, stage 3a: Secondary | ICD-10-CM | POA: Diagnosis present

## 2020-09-17 DIAGNOSIS — E119 Type 2 diabetes mellitus without complications: Secondary | ICD-10-CM | POA: Diagnosis not present

## 2020-09-17 DIAGNOSIS — E7849 Other hyperlipidemia: Secondary | ICD-10-CM | POA: Diagnosis not present

## 2020-09-17 DIAGNOSIS — Z7902 Long term (current) use of antithrombotics/antiplatelets: Secondary | ICD-10-CM | POA: Diagnosis not present

## 2020-09-17 DIAGNOSIS — E1122 Type 2 diabetes mellitus with diabetic chronic kidney disease: Secondary | ICD-10-CM | POA: Diagnosis present

## 2020-09-17 DIAGNOSIS — R1032 Left lower quadrant pain: Secondary | ICD-10-CM | POA: Diagnosis not present

## 2020-09-17 DIAGNOSIS — Z7984 Long term (current) use of oral hypoglycemic drugs: Secondary | ICD-10-CM | POA: Diagnosis not present

## 2020-09-17 DIAGNOSIS — Z823 Family history of stroke: Secondary | ICD-10-CM | POA: Diagnosis not present

## 2020-09-17 DIAGNOSIS — K2100 Gastro-esophageal reflux disease with esophagitis, without bleeding: Secondary | ICD-10-CM | POA: Diagnosis not present

## 2020-09-17 DIAGNOSIS — N453 Epididymo-orchitis: Secondary | ICD-10-CM | POA: Diagnosis present

## 2020-09-17 DIAGNOSIS — E860 Dehydration: Secondary | ICD-10-CM | POA: Diagnosis present

## 2020-09-17 DIAGNOSIS — E7800 Pure hypercholesterolemia, unspecified: Secondary | ICD-10-CM | POA: Diagnosis present

## 2020-09-17 DIAGNOSIS — Z79899 Other long term (current) drug therapy: Secondary | ICD-10-CM | POA: Diagnosis not present

## 2020-09-17 DIAGNOSIS — K219 Gastro-esophageal reflux disease without esophagitis: Secondary | ICD-10-CM | POA: Diagnosis present

## 2020-09-17 DIAGNOSIS — Z6828 Body mass index (BMI) 28.0-28.9, adult: Secondary | ICD-10-CM | POA: Diagnosis not present

## 2020-09-17 DIAGNOSIS — Z800 Family history of malignant neoplasm of digestive organs: Secondary | ICD-10-CM | POA: Diagnosis not present

## 2020-09-17 DIAGNOSIS — N50812 Left testicular pain: Secondary | ICD-10-CM | POA: Diagnosis not present

## 2020-09-17 DIAGNOSIS — E6609 Other obesity due to excess calories: Secondary | ICD-10-CM | POA: Diagnosis not present

## 2020-09-17 DIAGNOSIS — Z8673 Personal history of transient ischemic attack (TIA), and cerebral infarction without residual deficits: Secondary | ICD-10-CM | POA: Diagnosis not present

## 2020-09-17 DIAGNOSIS — K922 Gastrointestinal hemorrhage, unspecified: Secondary | ICD-10-CM | POA: Diagnosis not present

## 2020-09-17 DIAGNOSIS — E1165 Type 2 diabetes mellitus with hyperglycemia: Secondary | ICD-10-CM | POA: Diagnosis present

## 2020-09-17 DIAGNOSIS — Z6830 Body mass index (BMI) 30.0-30.9, adult: Secondary | ICD-10-CM | POA: Diagnosis not present

## 2020-09-17 DIAGNOSIS — E1149 Type 2 diabetes mellitus with other diabetic neurological complication: Secondary | ICD-10-CM | POA: Diagnosis present

## 2020-09-17 LAB — CBC WITH DIFFERENTIAL/PLATELET
Abs Immature Granulocytes: 0.19 10*3/uL — ABNORMAL HIGH (ref 0.00–0.07)
Basophils Absolute: 0.1 10*3/uL (ref 0.0–0.1)
Basophils Relative: 0 %
Eosinophils Absolute: 0.1 10*3/uL (ref 0.0–0.5)
Eosinophils Relative: 1 %
HCT: 31.1 % — ABNORMAL LOW (ref 39.0–52.0)
Hemoglobin: 9.5 g/dL — ABNORMAL LOW (ref 13.0–17.0)
Immature Granulocytes: 1 %
Lymphocytes Relative: 9 %
Lymphs Abs: 1.9 10*3/uL (ref 0.7–4.0)
MCH: 27.4 pg (ref 26.0–34.0)
MCHC: 30.5 g/dL (ref 30.0–36.0)
MCV: 89.6 fL (ref 80.0–100.0)
Monocytes Absolute: 1.8 10*3/uL — ABNORMAL HIGH (ref 0.1–1.0)
Monocytes Relative: 9 %
Neutro Abs: 16.8 10*3/uL — ABNORMAL HIGH (ref 1.7–7.7)
Neutrophils Relative %: 80 %
Platelets: 301 10*3/uL (ref 150–400)
RBC: 3.47 MIL/uL — ABNORMAL LOW (ref 4.22–5.81)
RDW: 16 % — ABNORMAL HIGH (ref 11.5–15.5)
WBC: 21 10*3/uL — ABNORMAL HIGH (ref 4.0–10.5)
nRBC: 0 % (ref 0.0–0.2)

## 2020-09-17 LAB — MAGNESIUM: Magnesium: 2.2 mg/dL (ref 1.7–2.4)

## 2020-09-17 LAB — BASIC METABOLIC PANEL
Anion gap: 10 (ref 5–15)
BUN: 26 mg/dL — ABNORMAL HIGH (ref 8–23)
CO2: 24 mmol/L (ref 22–32)
Calcium: 9 mg/dL (ref 8.9–10.3)
Chloride: 103 mmol/L (ref 98–111)
Creatinine, Ser: 1.57 mg/dL — ABNORMAL HIGH (ref 0.61–1.24)
GFR, Estimated: 46 mL/min — ABNORMAL LOW (ref >60)
Glucose, Bld: 171 mg/dL — ABNORMAL HIGH (ref 70–99)
Potassium: 4.3 mmol/L (ref 3.5–5.1)
Sodium: 137 mmol/L (ref 135–145)

## 2020-09-17 LAB — GLUCOSE, CAPILLARY
Glucose-Capillary: 163 mg/dL — ABNORMAL HIGH (ref 70–99)
Glucose-Capillary: 180 mg/dL — ABNORMAL HIGH (ref 70–99)
Glucose-Capillary: 262 mg/dL — ABNORMAL HIGH (ref 70–99)
Glucose-Capillary: 186 mg/dL — ABNORMAL HIGH (ref 70–99)
Glucose-Capillary: 183 mg/dL — ABNORMAL HIGH (ref 70–99)

## 2020-09-17 MED ORDER — METOPROLOL SUCCINATE ER 50 MG PO TB24: 50.0000 mg | ORAL_TABLET | Freq: Every day | ORAL | Status: DC

## 2020-09-17 MED ORDER — LEVOFLOXACIN IN D5W 500 MG/100ML IV SOLN
500.0000 mg | INTRAVENOUS | Status: DC
  Administered 2020-09-17: 500 mg via INTRAVENOUS
  Filled 2020-09-17: qty 100

## 2020-09-17 MED ORDER — METOPROLOL SUCCINATE ER 25 MG PO TB24
25.0000 mg | ORAL_TABLET | Freq: Every day | ORAL | Status: DC
  Administered 2020-09-17 – 2020-09-18 (×2): 25 mg via ORAL
  Filled 2020-09-17 (×2): qty 1

## 2020-09-17 NOTE — Evaluation (Signed)
Physical Therapy Evaluation Patient Details Name: David Pruitt MRN: [id number] DOB: 1945/03/03 Today's Date: 09/17/2020   History of Present Illness  David Pruitt is a 75 y.o. male with medical history significant for new type 2 diabetes mellitus, hypertension, stage IIIa CKD, cerebrovascular disease, GERD, hyperlipidemia, GI bleeding, former smoker who reports that he was started on Farxiga 2 weeks ago.  He reports that his blood sugars have improved significantly since starting the medication.  He reports that with the last 4 days he has had increasing pain in the left testicle and asymmetric swelling of the testicles associated with increased urinary frequency and dysuria.  He reports having a fever at home.  He denies having chills.  He denies shortness of breath.  He reports groin pain.  No hematuria.   Clinical Impression  Patient functioning near baseline for functional mobility and gait demonstrating good return for ambulation in room and hallways without loss of balance.  Patient encouraged to ambulate ad lib in room and hallways for length of stay.  Plan:  Patient discharged from physical therapy to care of nursing for ambulation daily as tolerated for length of stay.      Follow Up Recommendations No PT follow up;Supervision - Intermittent    Equipment Recommendations  None recommended by PT    Recommendations for Other Services       Precautions / Restrictions Precautions Precautions: None Restrictions Weight Bearing Restrictions: No      Mobility  Bed Mobility Overal bed mobility: Modified Independent                  Transfers Overall transfer level: Modified independent                  Ambulation/Gait Ambulation/Gait assistance: Modified independent (Device/Increase time) Gait Distance (Feet): 200 Feet Assistive device: None Gait Pattern/deviations: WFL(Within Functional Limits) Gait velocity: slightly decreased   General Gait  Details: grossly WFL demonstrating good return for ambulation in room and hallways without loss of balance  Stairs            Wheelchair Mobility    Modified Rankin (Stroke Patients Only)       Balance Overall balance assessment: No apparent balance deficits (not formally assessed)                                           Pertinent Vitals/Pain Pain Assessment: No/denies pain    Home Living Family/patient expects to be discharged to:: Private residence Living Arrangements: Spouse/significant other Available Help at Discharge: Family;Available 24 hours/day Type of Home: House Home Access: Stairs to enter Entrance Stairs-Rails: None Entrance Stairs-Number of Steps: 3 Home Layout: One level Home Equipment: Walker - 2 wheels;Shower seat      Prior Function Level of Independence: Independent         Comments: Hydrographic surveyor, drives     Journalist, newspaper        Extremity/Trunk Assessment   Upper Extremity Assessment Upper Extremity Assessment: Overall WFL for tasks assessed    Lower Extremity Assessment Lower Extremity Assessment: Overall WFL for tasks assessed    Cervical / Trunk Assessment Cervical / Trunk Assessment: Normal  Communication   Communication: No difficulties  Cognition Arousal/Alertness: Awake/alert Behavior During Therapy: WFL for tasks assessed/performed Overall Cognitive Status: Within Functional Limits for tasks assessed  General Comments      Exercises     Assessment/Plan    PT Assessment Patent does not need any further PT services  PT Problem List         PT Treatment Interventions      PT Goals (Current goals can be found in the Care Plan section)  Acute Rehab PT Goals Patient Stated Goal: return home with family to assist PT Goal Formulation: With patient/family Time For Goal Achievement: 09/17/20 Potential to Achieve Goals: Good     Frequency     Barriers to discharge        Co-evaluation               AM-PAC PT "6 Clicks" Mobility  Outcome Measure Help needed turning from your back to your side while in a flat bed without using bedrails?: None Help needed moving from lying on your back to sitting on the side of a flat bed without using bedrails?: None Help needed moving to and from a bed to a chair (including a wheelchair)?: None Help needed standing up from a chair using your arms (e.g., wheelchair or bedside chair)?: None Help needed to walk in hospital room?: None Help needed climbing 3-5 steps with a railing? : None 6 Click Score: 24    End of Session   Activity Tolerance: Patient tolerated treatment well Patient left: in bed;with family/visitor present;with call bell/phone within reach Nurse Communication: Mobility status PT Visit Diagnosis: Unsteadiness on feet (R26.81);Other abnormalities of gait and mobility (R26.89);Muscle weakness (generalized) (M62.81)    Time: [id number] PT Time Calculation (min) (ACUTE ONLY): 12 min   Charges:   PT Evaluation $PT Eval Low Complexity: 1 Low PT Treatments $Therapeutic Activity: 8-22 mins        11:15 AM, 09/17/20 Lonell Grandchild, MPT Physical Therapist with [facility] 336 [phone] office [phone] mobile phone

## 2020-09-17 NOTE — Progress Notes (Addendum)
Pharmacy Antibiotic Note  David Pruitt a 75 y.o. male admitted on 09/17/2020 with  orchitis and epididymitis .  Pharmacy has been consulted for levaquin dosing.  Plan: Levofloxacin '500mg'$  IV q24h F/U cxs and clinical progress Monitor V/S, labs  Medical History: Past Medical History:  Diagnosis Date   CKD (chronic kidney disease), stage IIIa ([place]) 08/15/2020   CVA (cerebral infarction)    2006   DM (dermatomyositis)    Gastritis    GERD (gastroesophageal reflux disease)    Hemorrhoids    Hypercholesteremia    Hypertension    Lower GI bleed    SECONDARY TO DIVERTICULA,S/P BLEED   Overweight (BMI 25.0-29.9) 08/15/2020   Shortness of breath dyspnea    Type 2 diabetes mellitus (HCC)     Allergies:  No Known Allergies  Filed Weights   09/16/20 0740 09/17/20 0428  Weight: 90.7 kg (200 lb) 91.9 kg (202 lb 8 oz)    CBC Latest Ref Rng & Units 09/17/2020 09/16/2020 09/05/2020  WBC 4.0 - 10.5 K/uL 21.0(H) 24.3(H) -  Hemoglobin 13.0 - 17.0 g/dL 9.5(L) 9.6(L) 10.0(L)  Hematocrit 39.0 - 52.0 % 31.1(L) 30.7(L) 33.3(L)  Platelets 150 - 400 K/uL 301 291 -     Estimated Creatinine Clearance: 47.8 mL/min (A) (by C-G formula based on SCr of 1.57 mg/dL (H)).  Antibiotics Given (last 72 hours)     Date/Time Action Medication Dose   09/16/20 1125 Given   levofloxacin (LEVAQUIN) tablet 500 mg 500 mg       Antimicrobials this admission: levaquin 09/16/2020  >>   Microbiology results: 09/16/2020  BCx: ngtd 09/16/2020  UCx: sent  Thank you for allowing pharmacy to be a part of this patient's care.  Cristy Friedlander, PharmD Clinical Pharmacist

## 2020-09-17 NOTE — Progress Notes (Signed)
PROGRESS NOTE   David Pruitt  [id number] DOB: December 25, 1945 DOA: 09/16/2020 PCP: Sharilyn Sites, MD   Chief Complaint  Patient presents with   Groin Pain   Level of care: Med-Surg  Brief Admission History:  75 y.o. male with medical history significant for new type 2 diabetes mellitus, hypertension, stage IIIa CKD, cerebrovascular disease, GERD, hyperlipidemia, GI bleeding, former smoker who reports that he was started on Farxiga 2 weeks ago.  He reports that his blood sugars have improved significantly since starting the medication.  He reports that with the last 4 days he has had increasing pain in the left testicle and asymmetric swelling of the testicles associated with increased urinary frequency and dysuria.  He reports having a fever at home.  He denies having chills.  He denies shortness of breath.  He reports groin pain.  No hematuria.  Scrotal scrotal ultrasound reported as Heterogeneity and hyperemia of the left epididymis with a moderate left hydrocele is concerning for left epididymitis-orchitis.  Assessment & Plan:   Principal Problem:   Orchitis and epididymitis Active Problems:   GERD (gastroesophageal reflux disease)   B12 deficiency   Hypertension   Hypercholesteremia   Type 2 diabetes mellitus with hyperglycemia (HCC)   CKD (chronic kidney disease), stage IIIa (HCC)   Overweight (BMI 25.0-29.9)   Groin pain   Testicular pain   Nausea   Hypotension  Left orchitis and epididymitis-patient presenting with some signs concerning for bacteremia and will be admitted into the hospital for further evaluation and antibiotic therapy.  Levaquin ordered per pharmacy recommendations.  Blood cultures x2 - no growth to date.  Supportive care.  Pt continues to have pain and severe edema.  Continue IV levofloxacin for another 24 hours.     Leukocytosis - secondary to infection, follow BC and UC, treat as above. WBC trending down with treatment.     Hypotension-likely secondary  to dehydration, treating with IV fluid bolus and continuous infusion.  Resume home metoprolol.   Essential hypertension-given current hypotension temporarily holding home blood pressure medications.   Stage IIIa CKD-stable and following.  Renally dose medications as appropriate.   Groin pain and testicular pain-Tylenol ordered as needed for symptoms.  Treating condition with IV antibiotics as ordered.   Uncontrolled type 2 diabetes mellitus with neurological complications-I worry that [place] likely contributed to this current infection.  This is one of the possible adverse events that can happen from taking the drug.  He has been on it for almost 3 weeks.  I spoke with the patient and counseled with him and his wife at the bedside.  We will hold the medication at this time.  Pt will discuss with his PCP if he would like to continue taking her medication understanding the risk of recurrent GU infections.  Patient verbalizes understanding.  In the hospital we will treat with supplemental sliding scale coverage CBG monitoring. CBG (last 3)  Recent Labs    09/17/20 0354 09/17/20 0716 09/17/20 1119  GLUCAP 163* 180* 262*    GERD - protonix ordered for GI protection.      DVT prophylaxis: SQ heparin   Code Status: Full   Family Communication: wife at bedside updated 9/5, 9/6 Disposition Plan: anticipate DC home   Consults called: n/a  Admission status: INP Level of care: Med-Surg  Remains inpatient appropriate because:IV treatments appropriate due to intensity of illness or inability to take PO and Inpatient level of care appropriate due to severity of illness  Dispo: The  patient is from: Home              Anticipated d/c is to: Home              Patient currently is not medically stable to d/c.   Difficult to place patient No  Consultants:  N/a  Procedures:   N/a  Antimicrobials:  Levofloxacin 9/5>>   Subjective: Pt reports ongoing testicular swelling and pain    Objective: Vitals:   09/16/20 2229 09/17/20 0032 09/17/20 0428 09/17/20 1300  BP: 98/62 (!) 92/55 118/71 122/85  Pulse: (!) 108 100 (!) 101 (!) 109  Resp:  '18 18 20  '$ Temp:  98.7 F (37.1 C) 98.4 F (36.9 C) 98.1 F (36.7 C)  TempSrc:  Oral Oral Oral  SpO2: 94% 95% 100% 97%  Weight:   91.9 kg   Height:        Intake/Output Summary (Last 24 hours) at 09/17/2020 1514 Last data filed at 09/17/2020 1500 Gross per 24 hour  Intake 2519.22 ml  Output 2 ml  Net 2517.22 ml   Filed Weights   09/16/20 0740 09/17/20 0428  Weight: 90.7 kg 91.9 kg   Examination:  General exam: Appears calm and comfortable  Respiratory system: Clear to auscultation. Respiratory effort normal. Cardiovascular system: normal S1 & S2 heard. No JVD, murmurs, rubs, gallops or clicks. No pedal edema. Gastrointestinal system: Abdomen is nondistended, soft and nontender. No organomegaly or masses felt. Normal bowel sounds heard. GU: diffusely tender, swollen left testicle and very painful epididymis.  Central nervous system: Alert and oriented. No focal neurological deficits. Extremities: Symmetric 5 x 5 power. Skin: No rashes, lesions or ulcers Psychiatry: Judgement and insight appear normal. Mood & affect appropriate.   Data Reviewed: I have personally reviewed following labs and imaging studies  CBC: Recent Labs  Lab 09/16/20 1140 09/17/20 0524  WBC 24.3* 21.0*  NEUTROABS 20.2* 16.8*  HGB 9.6* 9.5*  HCT 30.7* 31.1*  MCV 89.2 89.6  PLT 291 [id number]    Basic Metabolic Panel: Recent Labs  Lab 09/16/20 1140 09/17/20 0524  NA 132* 137  K 4.1 4.3  CL 101 103  CO2 23 24  GLUCOSE 192* 171*  BUN 21 26*  CREATININE 1.63* 1.57*  CALCIUM 8.5* 9.0  MG  --  2.2    GFR: Estimated Creatinine Clearance: 47.8 mL/min (A) (by C-G formula based on SCr of 1.57 mg/dL (H)).  Liver Function Tests: Recent Labs  Lab 09/16/20 1140  AST 26  ALT 25  ALKPHOS 59  BILITOT 0.5  PROT 7.2  ALBUMIN 3.7     CBG: Recent Labs  Lab 09/16/20 1702 09/16/20 2123 09/17/20 0354 09/17/20 0716 09/17/20 1119  GLUCAP 200* 151* 163* 180* 262*    Recent Results (from the past 240 hour(s))  Culture, blood (routine x 2)     Status: None (Preliminary result)   Collection Time: 09/16/20  1:15 PM   Specimen: BLOOD RIGHT ARM  Result Value Ref Range Status   Specimen Description BLOOD RIGHT ARM  Final   Special Requests   Final    BOTTLES DRAWN AEROBIC AND ANAEROBIC Blood Culture results may not be optimal due to an excessive volume of blood received in culture bottles   Culture   Final    NO GROWTH < 24 HOURS Performed at [facility], [address] [place], [place] [address]    Report Status PENDING  Incomplete  Culture, blood (routine x 2)     Status: None (  Preliminary result)   Collection Time: 09/16/20  1:15 PM   Specimen: BLOOD LEFT ARM  Result Value Ref Range Status   Specimen Description BLOOD LEFT ARM  Final   Special Requests   Final    BOTTLES DRAWN AEROBIC AND ANAEROBIC Blood Culture adequate volume   Culture   Final    NO GROWTH < 24 HOURS Performed at [facility], [address], [place], [place] [address]    Report Status PENDING  Incomplete  Resp Panel by RT-PCR (Flu A&B, Covid) Nasopharyngeal Swab     Status: None   Collection Time: 09/16/20  1:20 PM   Specimen: Nasopharyngeal Swab; Nasopharyngeal(NP) swabs in vial transport medium  Result Value Ref Range Status   SARS Coronavirus 2 by RT PCR NEGATIVE NEGATIVE Final    Comment: (NOTE) SARS-CoV-2 target nucleic acids are NOT DETECTED.  The SARS-CoV-2 RNA is generally detectable in upper respiratory specimens during the acute phase of infection. The lowest concentration of SARS-CoV-2 viral copies this assay can detect is 138 copies/mL. A negative result does not preclude SARS-Cov-2 infection and should not be used as the sole basis for treatment or other patient management decisions. A negative result may occur with   improper specimen collection/handling, submission of specimen other than nasopharyngeal swab, presence of viral mutation(s) within the areas targeted by this assay, and inadequate number of viral copies(<138 copies/mL). A negative result must be combined with clinical observations, patient history, and epidemiological information. The expected result is Negative.  Fact Sheet for Patients:  EntrepreneurPulse.com.au  Fact Sheet for Healthcare Providers:  IncredibleEmployment.be  This test is no t yet approved or cleared by the [place] FDA and  has been authorized for detection and/or diagnosis of SARS-CoV-2 by FDA under an Emergency Use Authorization (EUA). This EUA will remain  in effect (meaning this test can be used) for the duration of the COVID-19 declaration under Section 564(b)(1) of the Act, 21 U.S.C.section 360bbb-3(b)(1), unless the authorization is terminated  or revoked sooner.       Influenza A by PCR NEGATIVE NEGATIVE Final   Influenza B by PCR NEGATIVE NEGATIVE Final    Comment: (NOTE) The Xpert Xpress SARS-CoV-2/FLU/RSV plus assay is intended as an aid in the diagnosis of influenza from Nasopharyngeal swab specimens and should not be used as a sole basis for treatment. Nasal washings and aspirates are unacceptable for Xpert Xpress SARS-CoV-2/FLU/RSV testing.  Fact Sheet for Patients: EntrepreneurPulse.com.au  Fact Sheet for Healthcare Providers: IncredibleEmployment.be  This test is not yet approved or cleared by the [place] FDA and has been authorized for detection and/or diagnosis of SARS-CoV-2 by FDA under an Emergency Use Authorization (EUA). This EUA will remain in effect (meaning this test can be used) for the duration of the COVID-19 declaration under Section 564(b)(1) of the Act, 21 U.S.C. section 360bbb-3(b)(1), unless the authorization is terminated  or revoked.  Performed at [facility], [address], [place], [place] [address]      Radiology Studies: US SCROTUM W/DOPPLER  Result Date: 09/16/2020 CLINICAL DATA:  Left testicular swelling for 4 days. Increased urinary frequency and low-grade temperature. EXAM: SCROTAL ULTRASOUND DOPPLER ULTRASOUND OF THE TESTICLES TECHNIQUE: Complete ultrasound examination of the testicles, epididymis, and other scrotal structures was performed. Color and spectral Doppler ultrasound were also utilized to evaluate blood flow to the testicles. COMPARISON:  None. FINDINGS: Right testicle Measurements: 5.1 x 2.5 x 4.0 cm. No mass or microlithiasis visualized. Left testicle Measurements: 4.7 x 3.0 x 3.6 cm.  No mass or microlithiasis visualized. Right epididymis:  Heterogeneous in appearance. Left epididymis: Heterogeneous in appearance with mild hyperemia compared to the right. Hydrocele:  Left hydrocele greater than right. Varicocele:  None visualized. Pulsed Doppler interrogation of both testes demonstrates normal low resistance arterial and venous waveforms bilaterally. IMPRESSION: Heterogeneity and hyperemia of the left epididymis with a moderate left hydrocele is concerning for left epididymitis-orchitis. Electronically Signed   By: Zerita Boers M.D.   On: 09/16/2020 10:05    Scheduled Meds:  acetaminophen  650 mg Oral Q6H   Or   acetaminophen  650 mg Rectal Q6H   acidophilus  2 capsule Oral Daily   ascorbic acid  1,000 mg Oral BID   clopidogrel  75 mg Oral Daily   heparin  5,000 Units Subcutaneous Q8H   insulin aspart  0-5 Units Subcutaneous QHS   insulin aspart  0-9 Units Subcutaneous TID WC   insulin aspart  3 Units Subcutaneous TID WC   magnesium oxide  400 mg Oral Daily   pantoprazole  40 mg Oral Daily   simvastatin  20 mg Oral QHS   vitamin B-12  1,000 mcg Oral Daily   Continuous Infusions:  sodium chloride 75 mL/hr at 09/17/20 0532   levofloxacin (LEVAQUIN) IV 500 mg (09/17/20 1251)     LOS: 0 days   Time spent: 38 mins   Elene Downum Wynetta Emery, MD How to contact the [facility] Attending or Consulting provider [place] or covering provider during after hours [place], for this patient?  Check the care team in [facility] and look for a) attending/consulting [facility] provider listed and b) the [facility] team listed Log into [email or web address] and use [place]'s universal password to access. If you do not have the password, please contact the hospital operator. Locate the [facility] provider you are looking for under [facility] and page to a number that you can be directly reached. If you still have difficulty reaching the provider, please page the [facility] (Director on Call) for the Hospitalists listed on amion for assistance.  09/17/2020, 3:14 PM

## 2020-09-18 DIAGNOSIS — E7849 Other hyperlipidemia: Secondary | ICD-10-CM | POA: Diagnosis not present

## 2020-09-18 DIAGNOSIS — E119 Type 2 diabetes mellitus without complications: Secondary | ICD-10-CM | POA: Diagnosis not present

## 2020-09-18 DIAGNOSIS — Z6830 Body mass index (BMI) 30.0-30.9, adult: Secondary | ICD-10-CM | POA: Diagnosis not present

## 2020-09-18 DIAGNOSIS — K922 Gastrointestinal hemorrhage, unspecified: Secondary | ICD-10-CM | POA: Diagnosis not present

## 2020-09-18 DIAGNOSIS — E6609 Other obesity due to excess calories: Secondary | ICD-10-CM | POA: Diagnosis not present

## 2020-09-18 LAB — GLUCOSE, CAPILLARY
Glucose-Capillary: 214 mg/dL — ABNORMAL HIGH (ref 70–99)
Glucose-Capillary: 186 mg/dL — ABNORMAL HIGH (ref 70–99)

## 2020-09-18 MED ORDER — LEVOFLOXACIN 500 MG PO TABS: 500.0000 mg | ORAL_TABLET | Freq: Every day | ORAL | 0 refills | Status: AC

## 2020-09-18 MED ORDER — CEFTRIAXONE SODIUM 1 G IJ SOLR
500.0000 mg | Freq: Once | INTRAMUSCULAR | Status: AC
  Administered 2020-09-18: 500 mg via INTRAMUSCULAR
  Filled 2020-09-18: qty 10

## 2020-09-18 MED ORDER — CEFTRIAXONE SODIUM 500 MG IJ SOLR
500.0000 mg | Freq: Once | INTRAMUSCULAR | Status: DC
  Filled 2020-09-18: qty 500

## 2020-09-18 NOTE — Discharge Summary (Signed)
Physician Discharge Summary  GRALIN SHIPES [id number] DOB: 17-May-1945 DOA: 09/16/2020  PCP: Sharilyn Sites, MD  Admit date: 09/16/2020  Discharge date: 09/18/2020  Admitted From:Home    Disposition:  Home  Recommendations for Outpatient Follow-up:  Follow up with PCP this afternoon with appointment scheduled regarding Farxiga, but hold for now Continue on levofloxacin 500 mg daily as prescribed for 8 more days to complete total 10-day course of treatment.  Patient did receive IM Rocephin shot prior to discharge Continue other home medications as prior Referral sent to urology for scheduling for outpatient visit to follow-up orchitis/epididymitis  Home Health: None  Equipment/Devices: None  Discharge Condition:Stable  CODE STATUS: Full  Diet recommendation: Heart Healthy/carb modified  Brief/Interim Summary: 75 y.o. male with medical history significant for new type 2 diabetes mellitus, hypertension, stage IIIa CKD, cerebrovascular disease, GERD, hyperlipidemia, GI bleeding, former smoker who reports that he was started on Farxiga 2 weeks ago.  He reports that his blood sugars have improved significantly since starting the medication.  He reports that with the last 4 days he has had increasing pain in the left testicle and asymmetric swelling of the testicles associated with increased urinary frequency and dysuria.  He reports having a fever at home.  He denies having chills.  He denies shortness of breath.  He reports groin pain.  No hematuria.  Scrotal scrotal ultrasound reported as Heterogeneity and hyperemia of the left epididymis with a moderate left hydrocele is concerning for left epididymitis-orchitis.  -Patient was admitted for treatment of left orchitis and epididymitis.  He was started on Levaquin IV over the course of 2 days with significant improvement in his pain and swelling.  Ultrasound of the scrotal region with left-sided hydrocele noted and no other acute findings.   He was noted to have associated leukocytosis which has been improving.  He feels well enough for discharge and can tolerate oral medications which have been sent to his pharmacy.  He has been recommended to stay off of the [facility] for now until he follows up with his PCP.  He will also have referral sent to urology for outpatient follow-up.  Discharge Diagnoses:  Principal Problem:   Orchitis and epididymitis Active Problems:   GERD (gastroesophageal reflux disease)   B12 deficiency   Hypertension   Hypercholesteremia   Type 2 diabetes mellitus with hyperglycemia (HCC)   CKD (chronic kidney disease), stage IIIa (HCC)   Overweight (BMI 25.0-29.9)   Groin pain   Testicular pain   Nausea   Hypotension  Principal discharge diagnosis: Left orchitis and epididymitis, possibly in the setting of recent Farxiga initiation.  Discharge Instructions  Discharge Instructions     Ambulatory referral to Urology   Complete by: As directed    Diet - low sodium heart healthy   Complete by: As directed    Increase activity slowly   Complete by: As directed       Allergies as of 09/18/2020   No Known Allergies      Medication List     STOP taking these medications    dapagliflozin propanediol 10 MG Tabs tablet Commonly known as: FARXIGA       TAKE these medications    acetaminophen 500 MG tablet Commonly known as: TYLENOL Take 1,000 mg by mouth every 6 (six) hours as needed for headache.   ascorbic acid 1000 MG tablet Commonly known as: VITAMIN C Take 1,000 mg by mouth in the morning and at bedtime.   clopidogrel 75 MG  tablet Commonly known as: PLAVIX Take 1 tablet (75 mg total) by mouth daily.   glipiZIDE 5 MG 24 hr tablet Commonly known as: GLUCOTROL XL Take 5 mg by mouth 2 (two) times daily.   glucosamine-chondroitin 500-400 MG tablet Take 1 tablet by mouth daily.   levofloxacin 500 MG tablet Commonly known as: Levaquin Take 1 tablet (500 mg total) by mouth daily  for 8 days.   lisinopril 10 MG tablet Commonly known as: ZESTRIL Take 10 mg by mouth daily.   loratadine 10 MG tablet Commonly known as: CLARITIN Take 10 mg daily by mouth.   Magnesium 250 MG Tabs Take 1 tablet by mouth daily.   metoprolol succinate 50 MG 24 hr tablet Commonly known as: TOPROL-XL Take 50 mg by mouth daily.   multivitamin with minerals tablet Take 1 tablet by mouth daily.   pantoprazole 40 MG tablet Commonly known as: PROTONIX Take 40 mg by mouth daily.   Probiotic (Lactobacillus) Caps Take 1 capsule by mouth daily.   simvastatin 20 MG tablet Commonly known as: ZOCOR Take 20 mg by mouth at bedtime.   sitaGLIPtin-metformin 50-1000 MG tablet Commonly known as: JANUMET Take 1 tablet 2 (two) times daily with a meal by mouth.   vitamin B-12 1000 MCG tablet Commonly known as: CYANOCOBALAMIN Take 1,000 mcg daily by mouth.        Follow-up Information     Sharilyn Sites, MD. Schedule an appointment as soon as possible for a visit.   Specialty: Family Medicine Contact information: [address] [place] [id number] [phone]         [place]. Schedule an appointment as soon as possible for a visit in 2 week(s).   Contact information: [address] [place] [id number] [phone]               No Known Allergies  Consultations: None   Procedures/Studies: US SCROTUM W/DOPPLER  Result Date: 09/16/2020 CLINICAL DATA:  Left testicular swelling for 4 days. Increased urinary frequency and low-grade temperature. EXAM: SCROTAL ULTRASOUND DOPPLER ULTRASOUND OF THE TESTICLES TECHNIQUE: Complete ultrasound examination of the testicles, epididymis, and other scrotal structures was performed. Color and spectral Doppler ultrasound were also utilized to evaluate blood flow to the testicles. COMPARISON:  None. FINDINGS: Right testicle Measurements: 5.1 x 2.5 x 4.0 cm. No mass or  microlithiasis visualized. Left testicle Measurements: 4.7 x 3.0 x 3.6 cm. No mass or microlithiasis visualized. Right epididymis:  Heterogeneous in appearance. Left epididymis: Heterogeneous in appearance with mild hyperemia compared to the right. Hydrocele:  Left hydrocele greater than right. Varicocele:  None visualized. Pulsed Doppler interrogation of both testes demonstrates normal low resistance arterial and venous waveforms bilaterally. IMPRESSION: Heterogeneity and hyperemia of the left epididymis with a moderate left hydrocele is concerning for left epididymitis-orchitis. Electronically Signed   By: Zerita Boers M.D.   On: 09/16/2020 10:05     Discharge Exam: Vitals:   09/18/20 0533 09/18/20 0819  BP: 112/72 135/89  Pulse: 84 96  Resp: 17   Temp: 98.5 F (36.9 C)   SpO2: 97% 96%   Vitals:   09/17/20 1708 09/17/20 2105 09/18/20 0533 09/18/20 0819  BP: (!) 145/79 123/78 112/72 135/89  Pulse: (!) 108 99 84 96  Resp:  16 17   Temp:  99.5 F (37.5 C) 98.5 F (36.9 C)   TempSrc:  Oral Oral   SpO2:  96% 97% 96%  Weight:   91.6 kg  Height:        General: Pt is alert, awake, not in acute distress Cardiovascular: RRR, S1/S2 +, no rubs, no gallops Respiratory: CTA bilaterally, no wheezing, no rhonchi Abdominal: Soft, NT, ND, bowel sounds + Extremities: no edema, no cyanosis Scrotal region with minimal erythema and no drainage.  No tenderness on palpation.    The results of significant diagnostics from this hospitalization (including imaging, microbiology, ancillary and laboratory) are listed below for reference.     Microbiology: Recent Results (from the past 240 hour(s))  Urine Culture     Status: Abnormal (Preliminary result)   Collection Time: 09/16/20  8:18 AM   Specimen: Urine, Clean Catch  Result Value Ref Range Status   Specimen Description   Final    URINE, CLEAN CATCH Performed at [facility], [address] [place], [place] [address]    Special Requests    Final    NONE Performed at [facility], [address] [place], [place] [address]    Culture >=100,000 COLONIES/mL GRAM NEGATIVE RODS (A)  Final   Report Status PENDING  Incomplete  Culture, blood (routine x 2)     Status: None (Preliminary result)   Collection Time: 09/16/20  1:15 PM   Specimen: BLOOD RIGHT ARM  Result Value Ref Range Status   Specimen Description BLOOD RIGHT ARM  Final   Special Requests   Final    BOTTLES DRAWN AEROBIC AND ANAEROBIC Blood Culture results may not be optimal due to an excessive volume of blood received in culture bottles   Culture   Final    NO GROWTH 2 DAYS Performed at [facility], [address], [place], [place] [address]    Report Status PENDING  Incomplete  Culture, blood (routine x 2)     Status: None (Preliminary result)   Collection Time: 09/16/20  1:15 PM   Specimen: BLOOD LEFT ARM  Result Value Ref Range Status   Specimen Description BLOOD LEFT ARM  Final   Special Requests   Final    BOTTLES DRAWN AEROBIC AND ANAEROBIC Blood Culture adequate volume   Culture   Final    NO GROWTH 2 DAYS Performed at [facility], [address], [place], [place] [address]    Report Status PENDING  Incomplete  Resp Panel by RT-PCR (Flu A&B, Covid) Nasopharyngeal Swab     Status: None   Collection Time: 09/16/20  1:20 PM   Specimen: Nasopharyngeal Swab; Nasopharyngeal(NP) swabs in vial transport medium  Result Value Ref Range Status   SARS Coronavirus 2 by RT PCR NEGATIVE NEGATIVE Final    Comment: (NOTE) SARS-CoV-2 target nucleic acids are NOT DETECTED.  The SARS-CoV-2 RNA is generally detectable in upper respiratory specimens during the acute phase of infection. The lowest concentration of SARS-CoV-2 viral copies this assay can detect is 138 copies/mL. A negative result does not preclude SARS-Cov-2 infection and should not be used as the sole basis for treatment or other patient management decisions. A negative result may occur with  improper  specimen collection/handling, submission of specimen other than nasopharyngeal swab, presence of viral mutation(s) within the areas targeted by this assay, and inadequate number of viral copies(<138 copies/mL). A negative result must be combined with clinical observations, patient history, and epidemiological information. The expected result is Negative.  Fact Sheet for Patients:  EntrepreneurPulse.com.au  Fact Sheet for Healthcare Providers:  IncredibleEmployment.be  This test is no t yet approved or cleared by the [place] and  has been authorized  for detection and/or diagnosis of SARS-CoV-2 by FDA under an Emergency Use Authorization (EUA). This EUA will remain  in effect (meaning this test can be used) for the duration of the COVID-19 declaration under Section 564(b)(1) of the Act, 21 U.S.C.section 360bbb-3(b)(1), unless the authorization is terminated  or revoked sooner.       Influenza A by PCR NEGATIVE NEGATIVE Final   Influenza B by PCR NEGATIVE NEGATIVE Final    Comment: (NOTE) The Xpert Xpress SARS-CoV-2/FLU/RSV plus assay is intended as an aid in the diagnosis of influenza from Nasopharyngeal swab specimens and should not be used as a sole basis for treatment. Nasal washings and aspirates are unacceptable for Xpert Xpress SARS-CoV-2/FLU/RSV testing.  Fact Sheet for Patients: EntrepreneurPulse.com.au  Fact Sheet for Healthcare Providers: IncredibleEmployment.be  This test is not yet approved or cleared by the [place] FDA and has been authorized for detection and/or diagnosis of SARS-CoV-2 by FDA under an Emergency Use Authorization (EUA). This EUA will remain in effect (meaning this test can be used) for the duration of the COVID-19 declaration under Section 564(b)(1) of the Act, 21 U.S.C. section 360bbb-3(b)(1), unless the authorization is terminated or revoked.  Performed at  [facility], [address] [place], [place] [address]      Labs: BNP (last 3 results) No results for input(s): BNP in the last 8760 hours. Basic Metabolic Panel: Recent Labs  Lab 09/16/20 1140 09/17/20 0524  NA 132* 137  K 4.1 4.3  CL 101 103  CO2 23 24  GLUCOSE 192* 171*  BUN 21 26*  CREATININE 1.63* 1.57*  CALCIUM 8.5* 9.0  MG  --  2.2   Liver Function Tests: Recent Labs  Lab 09/16/20 1140  AST 26  ALT 25  ALKPHOS 59  BILITOT 0.5  PROT 7.2  ALBUMIN 3.7   No results for input(s): LIPASE, AMYLASE in the last 168 hours. No results for input(s): AMMONIA in the last 168 hours. CBC: Recent Labs  Lab 09/16/20 1140 09/17/20 0524  WBC 24.3* 21.0*  NEUTROABS 20.2* 16.8*  HGB 9.6* 9.5*  HCT 30.7* 31.1*  MCV 89.2 89.6  PLT 291 301   Cardiac Enzymes: No results for input(s): CKTOTAL, CKMB, CKMBINDEX, TROPONINI in the last 168 hours. BNP: Invalid input(s): POCBNP CBG: Recent Labs  Lab 09/17/20 1119 09/17/20 1644 09/17/20 2115 09/18/20 0321 09/18/20 0755  GLUCAP 262* 186* 183* 214* 186*   D-Dimer No results for input(s): DDIMER in the last 72 hours. Hgb A1c No results for input(s): HGBA1C in the last 72 hours. Lipid Profile No results for input(s): CHOL, HDL, LDLCALC, TRIG, CHOLHDL, LDLDIRECT in the last 72 hours. Thyroid function studies No results for input(s): TSH, T4TOTAL, T3FREE, THYROIDAB in the last 72 hours.  Invalid input(s): FREET3 Anemia work up No results for input(s): VITAMINB12, FOLATE, FERRITIN, TIBC, IRON, RETICCTPCT in the last 72 hours. Urinalysis    Component Value Date/Time   COLORURINE YELLOW 09/16/2020 0818   APPEARANCEUR CLEAR 09/16/2020 0818   LABSPEC 1.015 09/16/2020 0818   PHURINE 5.5 09/16/2020 0818   GLUCOSEU >=500 (A) 09/16/2020 0818   HGBUR NEGATIVE 09/16/2020 0818   BILIRUBINUR NEGATIVE 09/16/2020 0818   KETONESUR NEGATIVE 09/16/2020 0818   PROTEINUR TRACE (A) 09/16/2020 0818   NITRITE POSITIVE (A) 09/16/2020  0818   LEUKOCYTESUR TRACE (A) 09/16/2020 0818   Sepsis Labs Invalid input(s): PROCALCITONIN,  WBC,  LACTICIDVEN Microbiology Recent Results (from the past 240 hour(s))  Urine Culture     Status: Abnormal (Preliminary result)  Collection Time: 09/16/20  8:18 AM   Specimen: Urine, Clean Catch  Result Value Ref Range Status   Specimen Description   Final    URINE, CLEAN CATCH Performed at [facility], [address] [place], [place] [address]    Special Requests   Final    NONE Performed at [facility], [address], [place], [place] [address]    Culture >=100,000 COLONIES/mL GRAM NEGATIVE RODS (A)  Final   Report Status PENDING  Incomplete  Culture, blood (routine x 2)     Status: None (Preliminary result)   Collection Time: 09/16/20  1:15 PM   Specimen: BLOOD RIGHT ARM  Result Value Ref Range Status   Specimen Description BLOOD RIGHT ARM  Final   Special Requests   Final    BOTTLES DRAWN AEROBIC AND ANAEROBIC Blood Culture results may not be optimal due to an excessive volume of blood received in culture bottles   Culture   Final    NO GROWTH 2 DAYS Performed at [facility], [address] [place], [place] [address]    Report Status PENDING  Incomplete  Culture, blood (routine x 2)     Status: None (Preliminary result)   Collection Time: 09/16/20  1:15 PM   Specimen: BLOOD LEFT ARM  Result Value Ref Range Status   Specimen Description BLOOD LEFT ARM  Final   Special Requests   Final    BOTTLES DRAWN AEROBIC AND ANAEROBIC Blood Culture adequate volume   Culture   Final    NO GROWTH 2 DAYS Performed at [facility], [address], [place], [place] [address]    Report Status PENDING  Incomplete  Resp Panel by RT-PCR (Flu A&B, Covid) Nasopharyngeal Swab     Status: None   Collection Time: 09/16/20  1:20 PM   Specimen: Nasopharyngeal Swab; Nasopharyngeal(NP) swabs in vial transport medium  Result Value Ref Range Status   SARS Coronavirus 2 by RT PCR NEGATIVE NEGATIVE  Final    Comment: (NOTE) SARS-CoV-2 target nucleic acids are NOT DETECTED.  The SARS-CoV-2 RNA is generally detectable in upper respiratory specimens during the acute phase of infection. The lowest concentration of SARS-CoV-2 viral copies this assay can detect is 138 copies/mL. A negative result does not preclude SARS-Cov-2 infection and should not be used as the sole basis for treatment or other patient management decisions. A negative result may occur with  improper specimen collection/handling, submission of specimen other than nasopharyngeal swab, presence of viral mutation(s) within the areas targeted by this assay, and inadequate number of viral copies(<138 copies/mL). A negative result must be combined with clinical observations, patient history, and epidemiological information. The expected result is Negative.  Fact Sheet for Patients:  EntrepreneurPulse.com.au  Fact Sheet for Healthcare Providers:  IncredibleEmployment.be  This test is no t yet approved or cleared by the [place] FDA and  has been authorized for detection and/or diagnosis of SARS-CoV-2 by FDA under an Emergency Use Authorization (EUA). This EUA will remain  in effect (meaning this test can be used) for the duration of the COVID-19 declaration under Section 564(b)(1) of the Act, 21 U.S.C.section 360bbb-3(b)(1), unless the authorization is terminated  or revoked sooner.       Influenza A by PCR NEGATIVE NEGATIVE Final   Influenza B by PCR NEGATIVE NEGATIVE Final    Comment: (NOTE) The Xpert Xpress SARS-CoV-2/FLU/RSV plus assay is intended as an aid in the diagnosis of influenza from Nasopharyngeal swab specimens and should not be used as  a sole basis for treatment. Nasal washings and aspirates are unacceptable for Xpert Xpress SARS-CoV-2/FLU/RSV testing.  Fact Sheet for Patients: EntrepreneurPulse.com.au  Fact Sheet for Healthcare  Providers: IncredibleEmployment.be  This test is not yet approved or cleared by the [place] FDA and has been authorized for detection and/or diagnosis of SARS-CoV-2 by FDA under an Emergency Use Authorization (EUA). This EUA will remain in effect (meaning this test can be used) for the duration of the COVID-19 declaration under Section 564(b)(1) of the Act, 21 U.S.C. section 360bbb-3(b)(1), unless the authorization is terminated or revoked.  Performed at [facility], [address] [place], [place] [address]      Time coordinating discharge: 35 minutes  SIGNED:   Rodena Goldmann, DO [facility] 09/18/2020, 10:20 AM  If 7PM-7AM, please contact night-coverage [email or web address]

## 2020-09-20 LAB — URINE CULTURE: Culture: >=100000 — AB

## 2020-09-21 LAB — CULTURE, BLOOD (ROUTINE X 2)
Culture: NO GROWTH
Culture: NO GROWTH
Special Requests: ADEQUATE

## 2020-10-01 ENCOUNTER — Other Ambulatory Visit (INDEPENDENT_AMBULATORY_CARE_PROVIDER_SITE_OTHER): Payer: Self-pay | Admitting: *Deleted

## 2020-10-01 DIAGNOSIS — D649 Anemia, unspecified: Secondary | ICD-10-CM

## 2020-10-07 DIAGNOSIS — D649 Anemia, unspecified: Secondary | ICD-10-CM | POA: Diagnosis not present

## 2020-10-07 LAB — CBC WITH DIFFERENTIAL/PLATELET
Absolute Monocytes: 1297 cells/uL — ABNORMAL HIGH (ref 200–950)
Basophils Absolute: 92 cells/uL (ref 0–200)
Basophils Relative: 0.7 %
Eosinophils Absolute: 314 cells/uL (ref 15–500)
Eosinophils Relative: 2.4 %
HCT: 35.7 % — ABNORMAL LOW (ref 38.5–50.0)
Hemoglobin: 10.8 g/dL — ABNORMAL LOW (ref 13.2–17.1)
Lymphs Abs: 1729 cells/uL (ref 850–3900)
MCH: 26.5 pg — ABNORMAL LOW (ref 27.0–33.0)
MCHC: 30.3 g/dL — ABNORMAL LOW (ref 32.0–36.0)
MCV: 87.5 fL (ref 80.0–100.0)
MPV: 10.1 fL (ref 7.5–12.5)
Monocytes Relative: 9.9 %
Neutro Abs: 9668 cells/uL — ABNORMAL HIGH (ref 1500–7800)
Neutrophils Relative %: 73.8 %
Platelets: 378 10*3/uL (ref 140–400)
RBC: 4.08 10*6/uL — ABNORMAL LOW (ref 4.20–5.80)
RDW: 15.9 % — ABNORMAL HIGH (ref 11.0–15.0)
Total Lymphocyte: 13.2 %
WBC: 13.1 10*3/uL — ABNORMAL HIGH (ref 3.8–10.8)

## 2020-10-09 ENCOUNTER — Ambulatory Visit (INDEPENDENT_AMBULATORY_CARE_PROVIDER_SITE_OTHER): Payer: Medicare Other | Admitting: Urology

## 2020-10-09 ENCOUNTER — Encounter: Payer: Self-pay | Admitting: Urology

## 2020-10-09 ENCOUNTER — Other Ambulatory Visit: Payer: Self-pay

## 2020-10-09 VITALS — BP 120/77 | HR 89 | Temp 98.0°F | Ht 70.0 in | Wt 203.4 lb

## 2020-10-09 DIAGNOSIS — N453 Epididymo-orchitis: Secondary | ICD-10-CM | POA: Diagnosis not present

## 2020-10-09 DIAGNOSIS — Z8042 Family history of malignant neoplasm of prostate: Secondary | ICD-10-CM | POA: Diagnosis not present

## 2020-10-09 DIAGNOSIS — N451 Epididymitis: Secondary | ICD-10-CM

## 2020-10-09 DIAGNOSIS — Z87440 Personal history of urinary (tract) infections: Secondary | ICD-10-CM | POA: Diagnosis not present

## 2020-10-09 LAB — BLADDER SCAN AMB NON-IMAGING: Scan Result: 72

## 2020-10-09 LAB — URINALYSIS, ROUTINE W REFLEX MICROSCOPIC
Bilirubin, UA: NEGATIVE
Glucose, UA: NEGATIVE
Ketones, UA: NEGATIVE
Leukocytes,UA: NEGATIVE
Nitrite, UA: NEGATIVE
Protein,UA: NEGATIVE
RBC, UA: NEGATIVE
Specific Gravity, UA: 1.015 (ref 1.005–1.030)
Urobilinogen, Ur: 0.2 mg/dL (ref 0.2–1.0)
pH, UA: 5.5 (ref 5.0–7.5)

## 2020-10-09 NOTE — Progress Notes (Signed)
Assessment: 1. Epididymitis, left   2. History of UTI   3. Family history of prostate cancer      Plan: I personally reviewed the patient's chart including his ultrasound report as well as notes from the emergency room and his discharge summary. His epididymitis and UTI have completely resolved.  He is currently asymptomatic. Recommend follow-up as needed. Discussed prostate cancer screening with a PSA and DRE in several months.  Chief Complaint:  Chief Complaint  Patient presents with   epididymitis    History of Present Illness:  David Pruitt is a 75 y.o. year old male who is seen in consultation from [facility], DO  for evaluation of epididymoorchitis.  He presented to the emergency room on 09/16/2020 with left scrotal swelling for 4 days and increased lower urinary tract symptoms.  He noted increased frequency, urgency, and nocturia.  No gross hematuria or flank pain.  Scrotal ultrasound demonstrated hyperemia of the left epididymis with a moderate left hydrocele consistent with left epididymal orchitis.  His urine culture grew >100 K E. coli.  He was treated with Levaquin.  He was discharged from the hospital after 2 days.  He completed a 10-day course of Levaquin.  Since completing the antibiotics, his urinary symptoms have resolved.  The scrotal swelling and pain also resolved.  He feels like he is at his baseline from a urinary standpoint.  No history of recurrent UTIs. He does have a history of kidney stones passing all of his stone spontaneously.  His last stone episode was approximately 1 year ago. AUA score = 4 today.  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  He underwent anterior urethroplasty, buried penis repair, mons plasty, and split thickness skin graft to penis on 05/22/2019.  He does have a family history of prostate cancer. PSA from 2/21: 0.68   Past Medical History:  Past Medical History:  Diagnosis Date   CKD  (chronic kidney disease), stage IIIa ([place]) 08/15/2020   CVA (cerebral infarction)    2006   DM (dermatomyositis)    Gastritis    GERD (gastroesophageal reflux disease)    Hemorrhoids    Hypercholesteremia    Hypertension    Lower GI bleed    SECONDARY TO DIVERTICULA,S/P BLEED   Overweight (BMI 25.0-29.9) 08/15/2020   Shortness of breath dyspnea    Type 2 diabetes mellitus ([place])     Past Surgical History:  Past Surgical History:  Procedure Laterality Date   COLONOSCOPY  12/2009   RMR: Lower GI bleed due to a single diverticulum status post Endo clipping.  Scattered diverticulosis.   COLONOSCOPY N/A 05/24/2014   Procedure: COLONOSCOPY;  Surgeon: Rogene Houston, MD;  [facility]: [facility];  Service: Endoscopy;  Laterality: N/A;  1030   COLONOSCOPY N/A 03/15/2019   Diverticulosis, 5 polyps removed.  Pathology revealed tubular adenomas.  Next colonoscopy in 3 years.   COLONOSCOPY WITH PROPOFOL N/A 08/16/2020   Procedure: COLONOSCOPY WITH PROPOFOL;  Surgeon: Rogene Houston, MD;  Location: [facility];  Service: Endoscopy;  Laterality: N/A;   ESOPHAGOGASTRODUODENOSCOPY  12/2009   RMR: Mild esophagitis, gastritis, duodenitis.  Gastric biopsies negative for H. pylori   ESOPHAGOGASTRODUODENOSCOPY (EGD) WITH PROPOFOL N/A 08/15/2020   Procedure: ESOPHAGOGASTRODUODENOSCOPY (EGD) WITH PROPOFOL;  Surgeon: Daneil Dolin, MD;  Location: [facility];  Service: Endoscopy;  Laterality: N/A;  covid + (asymptomatic)   GIVENS CAPSULE STUDY N/A 08/29/2020   Procedure: GIVENS  CAPSULE STUDY;  Surgeon: Rogene Houston, MD;  [facility]: [facility];  Service: Endoscopy;  Laterality: N/A;   POLYPECTOMY  03/15/2019   Procedure: POLYPECTOMY;  Surgeon: Daneil Dolin, MD;  Location: [facility];  Service: Endoscopy;;    Allergies:  No Known Allergies  Family History:  Family History  Problem Relation Age of Onset   Colon cancer Father    Stroke Father     Social History:  Social History   Tobacco  Use   Smoking status: Former    Packs/day: 1.00    Years: 40.00    Pack years: 40.00    Types: Cigarettes   Smokeless tobacco: Never  Vaping Use   Vaping Use: Never used  Substance Use Topics   Alcohol use: Yes    Comment: occasionally   Drug use: No    Review of symptoms:  Constitutional:  Negative for unexplained weight loss, night sweats, fever, chills ENT:  Negative for nose bleeds, sinus pain, painful swallowing CV:  Negative for chest pain, shortness of breath, exercise intolerance, palpitations, loss of consciousness Resp:  Negative for cough, wheezing, shortness of breath GI:  Negative for nausea, vomiting, diarrhea, bloody stools GU:  Positives noted in HPI; otherwise negative for gross hematuria, dysuria, urinary incontinence Neuro:  Negative for seizures, poor balance, limb weakness, slurred speech Psych:  Negative for lack of energy, depression, anxiety Endocrine:  Negative for polydipsia, polyuria, symptoms of hypoglycemia (dizziness, hunger, sweating) Hematologic:  Negative for anemia, purpura, petechia, prolonged or excessive bleeding, use of anticoagulants  Allergic:  Negative for difficulty breathing or choking as a result of exposure to anything; no shellfish allergy; no allergic response (rash/itch) to materials, foods  Physical exam: BP 120/77 (BP Location: Left Arm, Patient Position: Sitting, Cuff Size: Normal)   Pulse 89   Temp 98 F (36.7 C)   Ht 5\' 10"  (1.778 m)   Wt 203 lb 6.4 oz (92.3 kg)   BMI 29.18 kg/m  GENERAL APPEARANCE:  Well appearing, well developed, well nourished, NAD HEENT: Atraumatic, Normocephalic, oropharynx clear. NECK: Supple without lymphadenopathy or thyromegaly. LUNGS: Clear to auscultation bilaterally. HEART: Regular Rate and Rhythm without murmurs, gallops, or rubs. ABDOMEN: Soft, non-tender, No Masses. EXTREMITIES: Moves all extremities well.  Without clubbing, cyanosis, or edema. NEUROLOGIC:  Alert and oriented x 3, normal  gait, CN II-XII grossly intact.  MENTAL STATUS:  Appropriate. BACK:  Non-tender to palpation.  No CVAT SKIN:  Warm, dry and intact.   GU: Penis: Status post surgical management of buried penis with skin grafting Meatus: Normal Scrotum: normal, no masses Testis: normal without masses bilateral Epididymis: normal   Results: Results for orders placed or performed in visit on 10/09/20 (from the past 24 hour(s))  BLADDER SCAN AMB NON-IMAGING   Collection Time: 10/09/20  2:37 PM  Result Value Ref Range   Scan Result 72    U/A dipstick:  negative

## 2020-10-09 NOTE — Progress Notes (Signed)
post void residual=72

## 2020-10-09 NOTE — Progress Notes (Signed)
Urological Symptom Review  Patient is experiencing the following symptoms: Get up at night to urinate Injury to kidneys/bladder   Review of Systems  Gastrointestinal (upper)  : Negative for upper GI symptoms  Gastrointestinal (lower) : Diarrhea  Constitutional : Negative for symptoms  Skin: Negative for skin symptoms  Eyes: Negative for eye symptoms  Ear/Nose/Throat : Negative for Ear/Nose/Throat symptoms  Hematologic/Lymphatic: Negative for Hematologic/Lymphatic symptoms  Cardiovascular : Negative for cardiovascular symptoms  Respiratory : Shortness of breath  Endocrine: Negative for endocrine symptoms  Musculoskeletal: Negative for musculoskeletal symptoms  Neurological: Negative for neurological symptoms  Psychologic: Negative for psychiatric symptoms

## 2020-10-11 DIAGNOSIS — E782 Mixed hyperlipidemia: Secondary | ICD-10-CM | POA: Diagnosis not present

## 2020-10-11 DIAGNOSIS — E1140 Type 2 diabetes mellitus with diabetic neuropathy, unspecified: Secondary | ICD-10-CM | POA: Diagnosis not present

## 2020-10-11 DIAGNOSIS — K219 Gastro-esophageal reflux disease without esophagitis: Secondary | ICD-10-CM | POA: Diagnosis not present

## 2020-10-11 DIAGNOSIS — I10 Essential (primary) hypertension: Secondary | ICD-10-CM | POA: Diagnosis not present

## 2020-10-23 ENCOUNTER — Other Ambulatory Visit (INDEPENDENT_AMBULATORY_CARE_PROVIDER_SITE_OTHER): Payer: Self-pay | Admitting: *Deleted

## 2020-10-23 DIAGNOSIS — D72829 Elevated white blood cell count, unspecified: Secondary | ICD-10-CM

## 2020-10-29 DIAGNOSIS — E11319 Type 2 diabetes mellitus with unspecified diabetic retinopathy without macular edema: Secondary | ICD-10-CM | POA: Diagnosis not present

## 2020-11-04 DIAGNOSIS — Z789 Other specified health status: Secondary | ICD-10-CM | POA: Diagnosis not present

## 2020-11-04 DIAGNOSIS — E6609 Other obesity due to excess calories: Secondary | ICD-10-CM | POA: Diagnosis not present

## 2020-11-04 DIAGNOSIS — Z6829 Body mass index (BMI) 29.0-29.9, adult: Secondary | ICD-10-CM | POA: Diagnosis not present

## 2020-11-04 DIAGNOSIS — Z23 Encounter for immunization: Secondary | ICD-10-CM | POA: Diagnosis not present

## 2020-11-07 DIAGNOSIS — D72829 Elevated white blood cell count, unspecified: Secondary | ICD-10-CM | POA: Diagnosis not present

## 2020-11-07 LAB — CBC WITH DIFFERENTIAL/PLATELET
Absolute Monocytes: 1019 cells/uL — ABNORMAL HIGH (ref 200–950)
Basophils Absolute: 69 cells/uL (ref 0–200)
Basophils Relative: 0.7 %
Eosinophils Absolute: 235 cells/uL (ref 15–500)
Eosinophils Relative: 2.4 %
HCT: 36.3 % — ABNORMAL LOW (ref 38.5–50.0)
Hemoglobin: 11.2 g/dL — ABNORMAL LOW (ref 13.2–17.1)
Lymphs Abs: 1754 cells/uL (ref 850–3900)
MCH: 26.6 pg — ABNORMAL LOW (ref 27.0–33.0)
MCHC: 30.9 g/dL — ABNORMAL LOW (ref 32.0–36.0)
MCV: 86.2 fL (ref 80.0–100.0)
MPV: 10.1 fL (ref 7.5–12.5)
Monocytes Relative: 10.4 %
Neutro Abs: 6723 cells/uL (ref 1500–7800)
Neutrophils Relative %: 68.6 %
Platelets: 275 10*3/uL (ref 140–400)
RBC: 4.21 10*6/uL (ref 4.20–5.80)
RDW: 16.8 % — ABNORMAL HIGH (ref 11.0–15.0)
Total Lymphocyte: 17.9 %
WBC: 9.8 10*3/uL (ref 3.8–10.8)

## 2020-11-11 DIAGNOSIS — E782 Mixed hyperlipidemia: Secondary | ICD-10-CM | POA: Diagnosis not present

## 2020-11-11 DIAGNOSIS — K219 Gastro-esophageal reflux disease without esophagitis: Secondary | ICD-10-CM | POA: Diagnosis not present

## 2020-11-11 DIAGNOSIS — I10 Essential (primary) hypertension: Secondary | ICD-10-CM | POA: Diagnosis not present

## 2020-11-11 DIAGNOSIS — E1140 Type 2 diabetes mellitus with diabetic neuropathy, unspecified: Secondary | ICD-10-CM | POA: Diagnosis not present

## 2020-11-19 DIAGNOSIS — Z23 Encounter for immunization: Secondary | ICD-10-CM | POA: Diagnosis not present

## 2020-11-26 DIAGNOSIS — T161XXA Foreign body in right ear, initial encounter: Secondary | ICD-10-CM | POA: Diagnosis not present

## 2020-12-11 DIAGNOSIS — K219 Gastro-esophageal reflux disease without esophagitis: Secondary | ICD-10-CM | POA: Diagnosis not present

## 2020-12-11 DIAGNOSIS — E782 Mixed hyperlipidemia: Secondary | ICD-10-CM | POA: Diagnosis not present

## 2020-12-11 DIAGNOSIS — I10 Essential (primary) hypertension: Secondary | ICD-10-CM | POA: Diagnosis not present

## 2020-12-11 DIAGNOSIS — E1140 Type 2 diabetes mellitus with diabetic neuropathy, unspecified: Secondary | ICD-10-CM | POA: Diagnosis not present

## 2020-12-16 ENCOUNTER — Telehealth: Payer: Self-pay

## 2020-12-16 NOTE — Telephone Encounter (Signed)
Please add pt to cancellation list. Pt is having diarrhea and is unable to come in tomorrow to see Magda Paganini due to conflict in schedule.

## 2020-12-16 NOTE — Telephone Encounter (Signed)
Pt called back and decided to go ahead and keep the appt with Neil Crouch on 12/17/20.

## 2020-12-17 ENCOUNTER — Ambulatory Visit (INDEPENDENT_AMBULATORY_CARE_PROVIDER_SITE_OTHER): Payer: Medicare Other | Admitting: Gastroenterology

## 2020-12-17 ENCOUNTER — Other Ambulatory Visit: Payer: Self-pay

## 2020-12-17 ENCOUNTER — Encounter: Payer: Self-pay | Admitting: Gastroenterology

## 2020-12-17 ENCOUNTER — Ambulatory Visit: Payer: Medicare Other | Admitting: Gastroenterology

## 2020-12-17 VITALS — BP 121/75 | HR 80 | Temp 96.9°F | Ht 70.0 in | Wt 203.8 lb

## 2020-12-17 DIAGNOSIS — R197 Diarrhea, unspecified: Secondary | ICD-10-CM | POA: Insufficient documentation

## 2020-12-17 DIAGNOSIS — A09 Infectious gastroenteritis and colitis, unspecified: Secondary | ICD-10-CM

## 2020-12-17 DIAGNOSIS — K921 Melena: Secondary | ICD-10-CM

## 2020-12-17 DIAGNOSIS — K219 Gastro-esophageal reflux disease without esophagitis: Secondary | ICD-10-CM

## 2020-12-17 NOTE — Progress Notes (Signed)
Primary Care Physician: Sharilyn Sites, MD  Primary Gastroenterologist:  Garfield Cornea, MD   Chief Complaint  Patient presents with   Diarrhea    Several times a week, sometimes can have daily. No RB    HPI: David Pruitt is a 75 y.o. male here for further evaluation of diarrhea. Patient last seen during of recent hospitalization 08/2020.  Presented at that time with several episodes of melena over several days, also some hematochezia.  On Plavix and meloxicam.  History of diverticular bleed in the past.  He was noted to have a hemoglobin of 9.6, down from 13.7 in January 2021. Incidentally noted to have SARS coronavirus 2 positive.    Completed EGD and colonoscopy without etiology to explain anemia, GI bleeding, melena. Hemoglobin got down to 8.4.  At time of discharge his hemoglobin was 10.  Completed outpatient capsule study on August 18 which was normal.  Follow-up hemoglobin on October 27 was 11.2.  He had a second hospitalization in September for orchitis and epididymitis.  Treated with Levaquin at that time.  Overall he feels much better.  He is on his way to work today.  Plans to retire soon.  Denies any further GI bleeding.  He has been using Flintstones off-and-on but not in a while.  Did not tolerate oral iron. No more bleeding.  Diarrhea off/on for few years. Last 3-4 months, stools a little worse.  Some days he has several bowel movements in the morning, stool may start off solid but then turns loose.  Has to go back several times to finish the bowel movement.  Stools are associated with urgency at times.  He has to stop en route to work to find a bathroom.  He may go a couple of days without a stool but that typically occurs after taking 1-2 Imodium for the diarrhea.  Usually no nocturnal stools, happened once.  No melena or rectal bleeding.  Currently on an antibiotic given by his dentist last week.  Took Levaquin in September as outlined above.  Takes 2 fiber Gummies  every day. No stomach pain. Heartburn controlled on pantoprazole.   Small bowel capsule endoscopy 08/29/20: -for h/o GI bleed/anemia -normal small bowel study  EGD August 15, 2020: -Completed for melena. -Possible short segment Barrett's esophagus?not biopsied today in the setting of acute bleeding. - Small hiatal hernia. - The examination was otherwise normal.  Colonoscopy August 5th 2022:  -Completed for rectal bleeding. -Diverticulosis -Internal hemorrhoids  Current Outpatient Medications  Medication Sig Dispense Refill   acetaminophen (TYLENOL) 500 MG tablet Take 1,000 mg by mouth every 6 (six) hours as needed for headache.      ascorbic acid (VITAMIN C) 1000 MG tablet Take 1,000 mg by mouth in the morning and at bedtime.     clopidogrel (PLAVIX) 75 MG tablet Take 1 tablet (75 mg total) by mouth daily.     glipiZIDE (GLUCOTROL XL) 5 MG 24 hr tablet Take 5 mg by mouth 2 (two) times daily.     glucosamine-chondroitin 500-400 MG tablet Take 1 tablet by mouth daily.     lisinopril (PRINIVIL,ZESTRIL) 10 MG tablet Take 10 mg by mouth daily.     loperamide (IMODIUM) 2 MG capsule Take by mouth as needed for diarrhea or loose stools.     loratadine (CLARITIN) 10 MG tablet Take 10 mg daily by mouth.     Magnesium 250 MG TABS Take 1 tablet by mouth daily.  metoprolol succinate (TOPROL-XL) 50 MG 24 hr tablet Take 50 mg by mouth daily.     Multiple Vitamins-Minerals (MULTIVITAMIN WITH MINERALS) tablet Take 1 tablet by mouth daily.     pantoprazole (PROTONIX) 40 MG tablet Take 40 mg by mouth daily.     Probiotic, Lactobacillus, CAPS Take 1 capsule by mouth daily.     sitaGLIPtin-metformin (JANUMET) 50-1000 MG tablet Take 1 tablet 2 (two) times daily with a meal by mouth.     vitamin B-12 (CYANOCOBALAMIN) 1000 MCG tablet Take 1,000 mcg daily by mouth.     No current facility-administered medications for this visit.    Allergies as of 12/17/2020   (No Known Allergies)     ROS:  General: Negative for anorexia, weight loss, fever, chills, fatigue, weakness. ENT: Negative for hoarseness, difficulty swallowing , nasal congestion. CV: Negative for chest pain, angina, palpitations, dyspnea on exertion, peripheral edema.  Respiratory: Negative for dyspnea at rest, dyspnea on exertion, cough, sputum, wheezing.  GI: See history of present illness. GU:  Negative for dysuria, hematuria, urinary incontinence, urinary frequency, nocturnal urination.  Endo: Negative for unusual weight change.    Physical Examination:   BP 121/75   Pulse 80   Temp (!) 96.9 F (36.1 C)   Ht 5\' 10"  (1.778 m)   Wt 203 lb 12.8 oz (92.4 kg)   BMI 29.24 kg/m   General: Well-nourished, well-developed in no acute distress.  Eyes: No icterus. Mouth: masked Abdomen: Bowel sounds are normal, nontender, nondistended, no hepatosplenomegaly or masses, no abdominal bruits or hernia , no rebound or guarding.   Extremities: No lower extremity edema. No clubbing or deformities. Neuro: Alert and oriented x 4   Skin: Warm and dry, no jaundice.   Psych: Alert and cooperative, normal mood and affect.  Labs:  Lab Results  Component Value Date   CREATININE 1.57 (H) 09/17/2020   BUN 26 (H) 09/17/2020   NA 137 09/17/2020   K 4.3 09/17/2020   CL 103 09/17/2020   CO2 24 09/17/2020   Lab Results  Component Value Date   ALT 25 09/16/2020   AST 26 09/16/2020   ALKPHOS 59 09/16/2020   BILITOT 0.5 09/16/2020   Lab Results  Component Value Date   WBC 9.8 11/07/2020   HGB 11.2 (L) 11/07/2020   HCT 36.3 (L) 11/07/2020   MCV 86.2 11/07/2020   PLT 275 11/07/2020      Imaging Studies: No results found.   Assessment:  History of GI bleed: Hospitalized in August, presenting with melena and rectal bleeding.  EGD, colonoscopy, small bowel capsule study with nothing to explain bleeding or anemia.  Last hemoglobin check 6 weeks ago, was up to 11.2.  Due for repeat later this month.  No  further bleeding noted.  He continue to monitor.  No longer on NSAIDs.   GERD: Doing well on pantoprazole 40 mg daily.  Chronic intermittent diarrhea: Previously he has associated this with his bouts of diverticulitis.  At this time he is having more frequent symptoms however typically has several bowel movements in the morning and then none later in the day.  May skip a few days without a bowel movement if he takes 1 or 2 Imodium.  Suspect symptoms exacerbated by medications, such as Janumet.  Given recent rounds of antibiotics, we need to exclude C. difficile.  Can try low-dose Imodium, 1 mg every morning.  Continue fiber supplements as before.  ?Barrett's esophagus: appearance of possible Barrett's on EGD in 08/2020  but no biopsies performed in setting of GI bleed.   Plan: Complete CBC at end of month as per Dr. Olevia Perches orders. Complete stool tests. Trial of imodium 1mg  each morning, hold if no BM in 24 hours.  Continue pantoprazole 40 mg daily. Continue fiber supplement daily. May require upper endoscopy for biopsies to rule out Barrett's esophagus, will discuss with Dr. Gala Romney.

## 2020-12-17 NOTE — Patient Instructions (Signed)
Please have labs as ordered by Dr. Laural Golden for later this month. Please collect stool test. We will be in touch with results as available. Try taking imodium 1/2 tablet every morning. If you go more than 24 hours without a stool, then skip a day on imodium.  Keep me posted on how your stools are doing, if persistent symptoms we can try other options.  I will discuss with Dr. Gala Romney regarding potential need for repeat upper endoscopy to biopsy for Barrett's esophagus. It looked like you may have it on procedure in 08/2020 but did not biopsies due to your active bleeding.  Continue pantoprazole 40mg  daily. Continue fiber supplement.

## 2020-12-18 DIAGNOSIS — L570 Actinic keratosis: Secondary | ICD-10-CM | POA: Diagnosis not present

## 2020-12-25 IMAGING — CT CT ABD-PELV W/ CM
2 of 5 series · 15 of 46 positions shown, 17 images · IV contrast (Omnipaque or Isovue)
Comparison: CT chest June 28, 2017, CT abdomen pelvis January 30, 2008

CLINICAL DATA: Abdominal pain, diverticulitis suspected, rectal
bleeding last night

EXAM:
CT ABDOMEN AND PELVIS WITH CONTRAST
TECHNIQUE: Multidetector CT imaging of the abdomen and pelvis was performed
using the standard protocol following bolus administration of
intravenous contrast.
CONTRAST:  75mL OMNIPAQUE IOHEXOL 300 MG/ML  SOLN

[Series 2: axial st · axial · 0.87mm/px · z∈[-521,-106]mm · 12 of 97 slices shown, 14 images]
[im 7/97  soft-tissue]
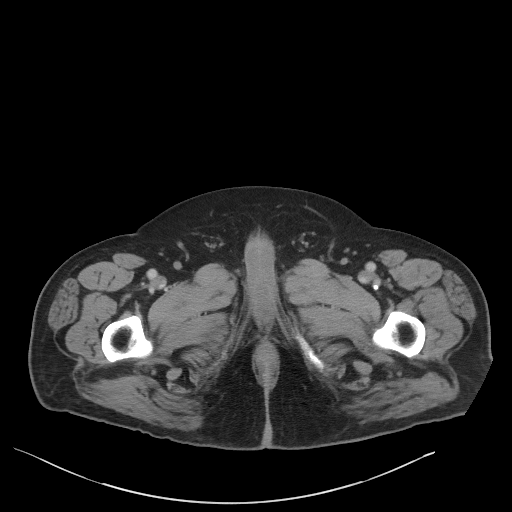
[im 7/97  bone]
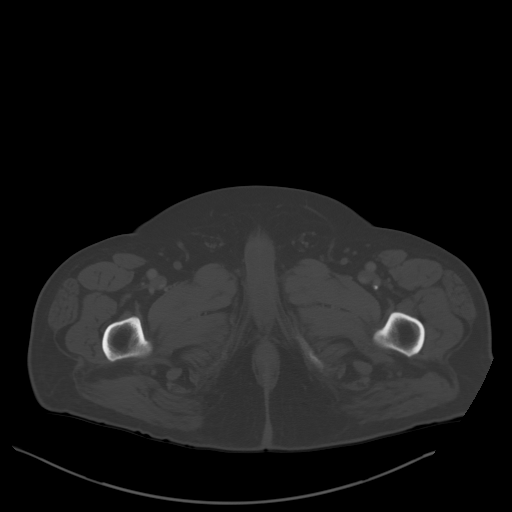
[im 13/97  soft-tissue]
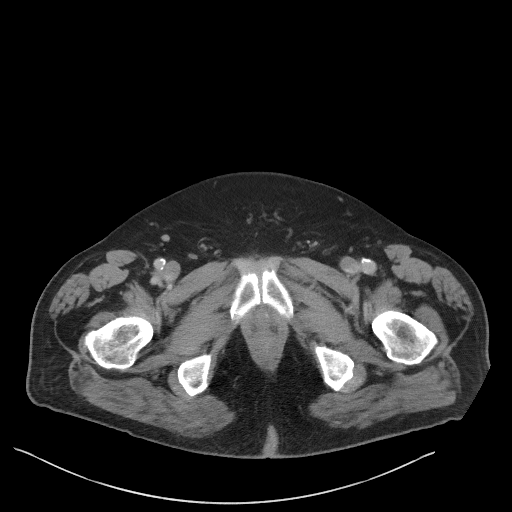
[im 20/97  soft-tissue]
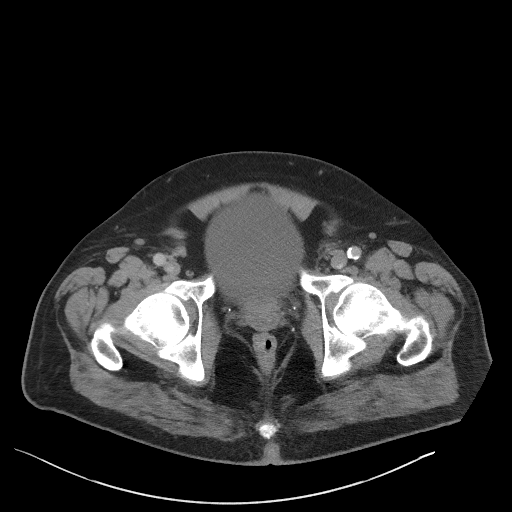
[im 33/97  soft-tissue]
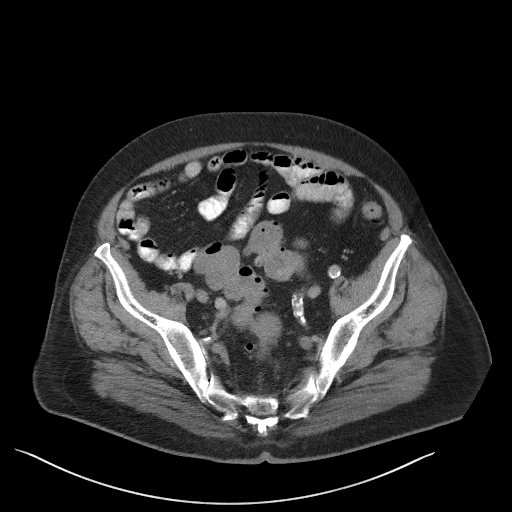
[im 39/97  soft-tissue]
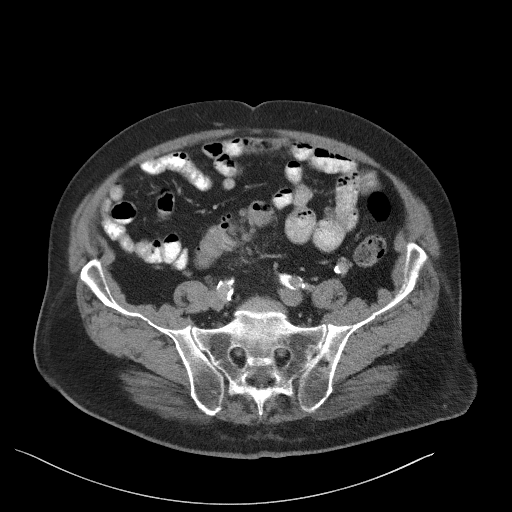
[im 45/97  soft-tissue]
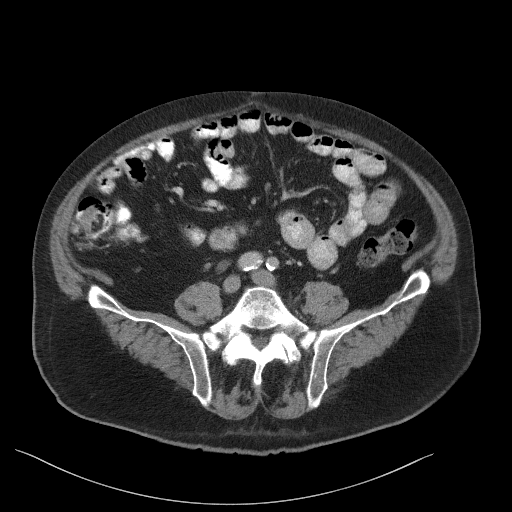
[im 52/97  soft-tissue]
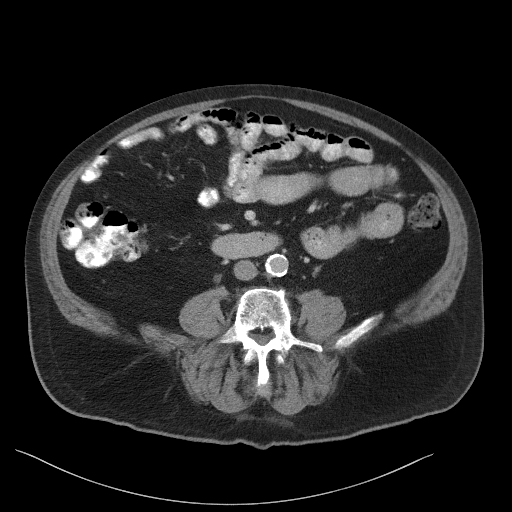
[im 58/97  soft-tissue]
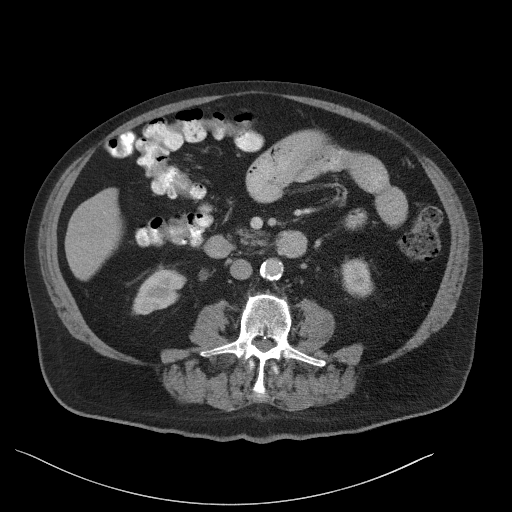
[im 65/97  soft-tissue]
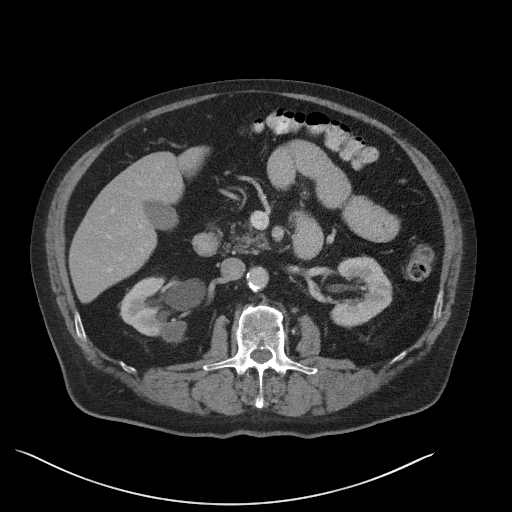
[im 65/97  bone]
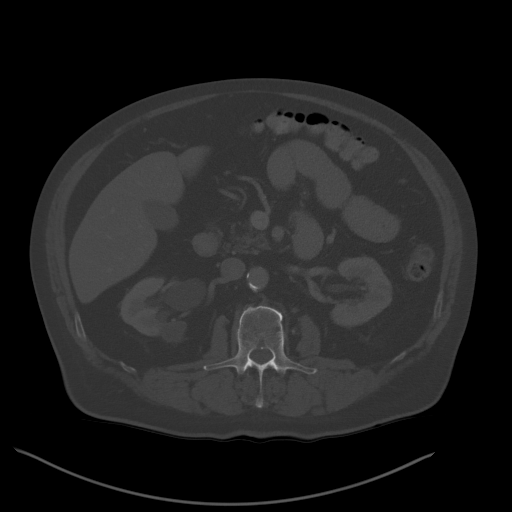
[im 77/97  soft-tissue]
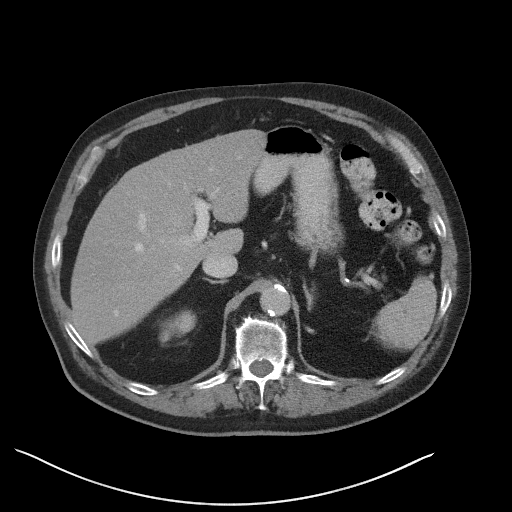
[im 84/97  soft-tissue]
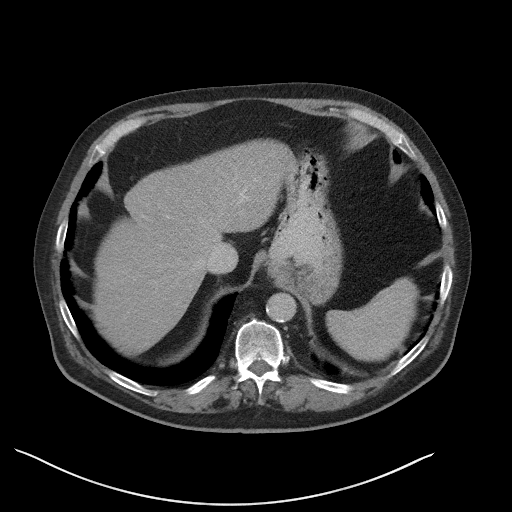
[im 90/97  soft-tissue]
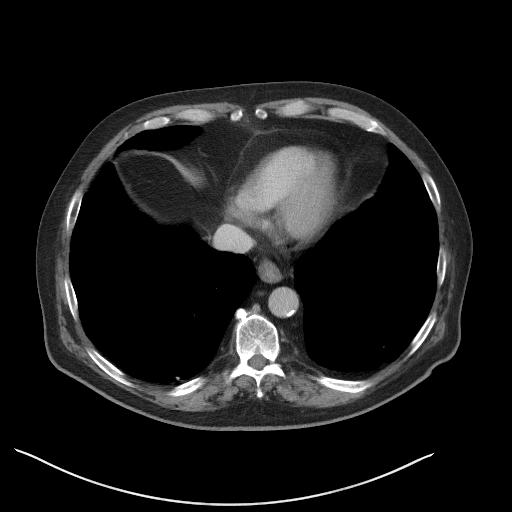

[Series 5: coronal st · coronal · 0.92mm/px · 3 of 107 slices shown]
[im 36/107  soft-tissue]
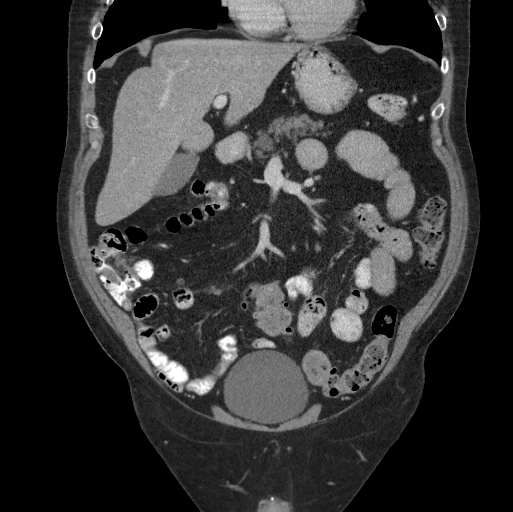
[im 48/107  soft-tissue]
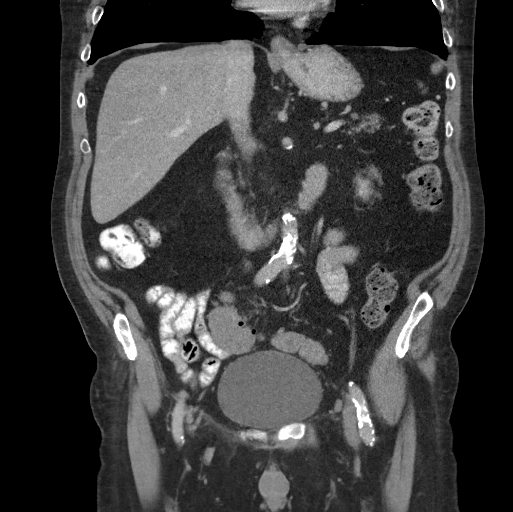
[im 59/107  soft-tissue]
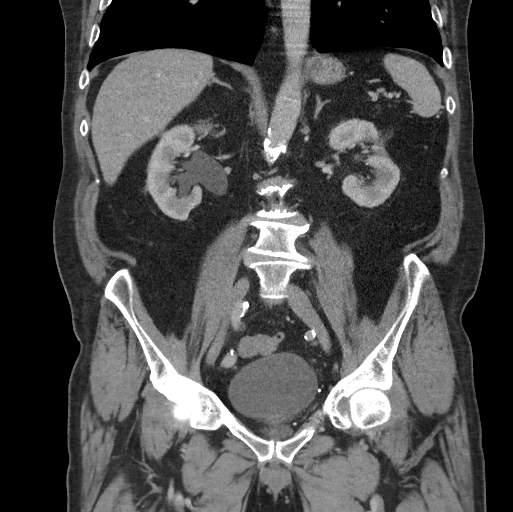

[15 of 46 positions shown; findings below may reference images not displayed]

FINDINGS: Lower chest: Partially calcified pleural-based nodules in the left
lung base and smaller nodule in the right lung base are unchanged
from comparison chest CT. Additional bandlike areas of atelectasis
and/or scarring in the lung bases. Normal heart size. No pericardial
effusion.

Hepatobiliary: Diffuse hepatic hypoattenuation compatible with
hepatic steatosis. No focal liver abnormality is seen. No
gallstones, gallbladder wall thickening, or biliary dilatation.

Pancreas: Fatty replacement of the pancreas. No pancreatic ductal
dilatation or surrounding inflammatory changes.

Spleen: Normal in size without focal abnormality.

Adrenals/Urinary Tract: Normal adrenal glands. Regions of bilateral
cortical scarring. There are bilateral fluid attenuation cysts in
both kidneys similar in distribution to the comparison exam with
several appearing decreased in size. There is moderate right
hydroureteronephrosis to the level of a 3 mm calculus just proximal
to the right UVJ (2/73) no other obstructing urolithiasis. No left
hydronephrosis. Mild stable bilateral symmetric perinephric
stranding, a nonspecific finding though may correlate with either
age or decreased renal function. Normal bladder.

Stomach/Bowel: Distal esophagus, stomach and duodenal sweep are
unremarkable. No small bowel wall thickening or dilatation. No
evidence of obstruction. Contrast media passes to the level of the
splenic flexure. Appendix is not well visualized. Proximal colon has
a normal appearance. Scattered sigmoid colonic diverticula 8 with a
focally thickened segment of the distal sigmoid and faint adjacent
pericolonic stranding suggestive of acute diverticulitis (axial
series 2, image 59). No extraluminal gas. No organized collection or
abscess formation.

Vascular/Lymphatic: Atherosclerotic plaque within the normal caliber
aorta. Few reactive lymph nodes in the abdomen. No pathologically
enlarged nodes.

Reproductive: The prostate and seminal vesicles are unremarkable.

Other: No abdominopelvic free fluid or free gas. No bowel containing
hernias.

Musculoskeletal: Multilevel degenerative changes are present in the
imaged portions of the spine. No acute osseous abnormality or
suspicious osseous lesion.
IMPRESSION: 1. Acute uncomplicated sigmoid diverticulitis. No evidence of
perforation or abscess formation.
2. Moderate right hydroureteronephrosis to the level of a 3 mm
calculus just proximal to the right UVJ.
3. Diminishing size of several bilateral renal cysts and bilateral
cortical scarring.
4. Hepatic steatosis.
5. Aortic Atherosclerosis ([id number]).
6. Stable calcified pleural nodules, may reflect sequela of prior
asbestos related exposure or prior infection/inflammation.

## 2021-01-01 ENCOUNTER — Telehealth: Payer: Self-pay | Admitting: Gastroenterology

## 2021-01-01 NOTE — Telephone Encounter (Signed)
Pt was made aware and verbalized understanding.  

## 2021-01-01 NOTE — Telephone Encounter (Signed)
Please let pt know that Dr. Gala Romney went back and look at his EGD images from when he was in the hospital. Originally there was report of possible Barrett's but not biopsies because of GI bleeding. Looking back at pictures now, he says overall likely not Barrett's and does not need any biopsies. He recommended continue pantoprazole daily to keep acid exposure down.   Please remind patient to have his labs done as advised by Dr. Laural Golden later this month.

## 2021-01-02 ENCOUNTER — Other Ambulatory Visit (INDEPENDENT_AMBULATORY_CARE_PROVIDER_SITE_OTHER): Payer: Self-pay | Admitting: *Deleted

## 2021-01-02 DIAGNOSIS — D500 Iron deficiency anemia secondary to blood loss (chronic): Secondary | ICD-10-CM

## 2021-01-10 DIAGNOSIS — E785 Hyperlipidemia, unspecified: Secondary | ICD-10-CM | POA: Diagnosis not present

## 2021-01-10 DIAGNOSIS — D500 Iron deficiency anemia secondary to blood loss (chronic): Secondary | ICD-10-CM | POA: Diagnosis not present

## 2021-01-10 DIAGNOSIS — E1140 Type 2 diabetes mellitus with diabetic neuropathy, unspecified: Secondary | ICD-10-CM | POA: Diagnosis not present

## 2021-01-10 DIAGNOSIS — I10 Essential (primary) hypertension: Secondary | ICD-10-CM | POA: Diagnosis not present

## 2021-01-10 DIAGNOSIS — K219 Gastro-esophageal reflux disease without esophagitis: Secondary | ICD-10-CM | POA: Diagnosis not present

## 2021-01-10 LAB — CBC WITH DIFFERENTIAL/PLATELET
Absolute Monocytes: 1137 cells/uL — ABNORMAL HIGH (ref 200–950)
Basophils Absolute: 104 cells/uL (ref 0–200)
Basophils Relative: 0.9 %
Eosinophils Absolute: 626 cells/uL — ABNORMAL HIGH (ref 15–500)
Eosinophils Relative: 5.4 %
HCT: 39.4 % (ref 38.5–50.0)
Hemoglobin: 12.4 g/dL — ABNORMAL LOW (ref 13.2–17.1)
Lymphs Abs: 1752 cells/uL (ref 850–3900)
MCH: 28.3 pg (ref 27.0–33.0)
MCHC: 31.5 g/dL — ABNORMAL LOW (ref 32.0–36.0)
MCV: 90 fL (ref 80.0–100.0)
MPV: 10.2 fL (ref 7.5–12.5)
Monocytes Relative: 9.8 %
Neutro Abs: 7981 cells/uL — ABNORMAL HIGH (ref 1500–7800)
Neutrophils Relative %: 68.8 %
Platelets: 281 10*3/uL (ref 140–400)
RBC: 4.38 10*6/uL (ref 4.20–5.80)
RDW: 16.6 % — ABNORMAL HIGH (ref 11.0–15.0)
Total Lymphocyte: 15.1 %
WBC: 11.6 10*3/uL — ABNORMAL HIGH (ref 3.8–10.8)

## 2021-01-27 ENCOUNTER — Telehealth: Payer: Self-pay | Admitting: Gastroenterology

## 2021-01-27 NOTE — Telephone Encounter (Signed)
Please call [facility] and tell them them the patient needs to pick up a stool sample kit,  per patient wife.  [phone] sheila

## 2021-01-27 NOTE — Telephone Encounter (Signed)
Pt needs an appointment for loose stools. Wife is aware appts are far out and that the pt can be put on cancellation list but a sooner appt is not guaranteed.

## 2021-01-27 NOTE — Telephone Encounter (Signed)
Spoke with wife and let her know that the orders were in the system.

## 2021-01-27 NOTE — Telephone Encounter (Signed)
Noted  

## 2021-01-31 ENCOUNTER — Other Ambulatory Visit: Payer: Self-pay

## 2021-01-31 ENCOUNTER — Encounter: Payer: Self-pay | Admitting: Internal Medicine

## 2021-01-31 ENCOUNTER — Ambulatory Visit (INDEPENDENT_AMBULATORY_CARE_PROVIDER_SITE_OTHER): Payer: Medicare Other | Admitting: Internal Medicine

## 2021-01-31 VITALS — BP 122/82 | HR 70 | Temp 97.6°F | Ht 70.0 in | Wt 205.2 lb

## 2021-01-31 DIAGNOSIS — A09 Infectious gastroenteritis and colitis, unspecified: Secondary | ICD-10-CM | POA: Diagnosis not present

## 2021-01-31 DIAGNOSIS — K219 Gastro-esophageal reflux disease without esophagitis: Secondary | ICD-10-CM | POA: Diagnosis not present

## 2021-01-31 DIAGNOSIS — K625 Hemorrhage of anus and rectum: Secondary | ICD-10-CM

## 2021-01-31 DIAGNOSIS — K5732 Diverticulitis of large intestine without perforation or abscess without bleeding: Secondary | ICD-10-CM

## 2021-01-31 NOTE — Progress Notes (Signed)
Primary Care Physician:  Sharilyn Sites, MD Primary Gastroenterologist:  Dr. Gala Romney  Pre-Procedure History & Physical: HPI:  David Pruitt is a 76 y.o. male here for follow-up regarding diarrhea.  Medical history significant for GI bleeding last year for which she went underwent EGD, colonoscopy and capsule study.  Diverticular etiology suspicious but not concluded.  There was concern about Farxiga -implicated as being associated with bleeding/infection.  That agent has been stopped.  He continues on clopidogrel. Clinically, no further bleeding.  Possible skinny tongue of salmon-colored epithelium within 2 cm of the GE junction noted prior EGD.  As discussed with the patient today, likely not clinically significant.  Follow-up not recommended  Major clinical issue now is diarrhea which he states she has had for for 5 years somewhat intermittent but still frequent and occurs multiple times weekly sometimes spends hours in the bathroom this is encumbering his day-to-day lifestyle.  He is not bleeding.  Loose bowel movements multiple episodes multiple episodes of incontinence.  Prior to the last 5 years he states he has not had normal bowel function 1-2 bowel movements daily no episodes of incontinence or any similar issues.  Type 2 diabetes mellitus diagnosed a few years back he has been on metformin for a few years.  Fatty pancreas noted on 2020 CT.  A stool studies were recommended last month but they have not been able to follow through.  However, they are going today to produce a sample for the lab.  It is notable his small bowel appeared normal on both EGD and capsule study.  No family history of celiac or other gastrointestinal illness.  He has not had nausea or vomiting.  Reflux well controlled on Protonix 40 mg daily   Past Medical History:  Diagnosis Date   CKD (chronic kidney disease), stage IIIa ([place]) 08/15/2020   CVA (cerebral infarction)    2006   DM (dermatomyositis)     Gastritis    GERD (gastroesophageal reflux disease)    Hemorrhoids    Hypercholesteremia    Hypertension    Lower GI bleed    SECONDARY TO DIVERTICULA,S/P BLEED   Overweight (BMI 25.0-29.9) 08/15/2020   Shortness of breath dyspnea    Type 2 diabetes mellitus ([place])     Past Surgical History:  Procedure Laterality Date   COLONOSCOPY  12/2009   RMR: Lower GI bleed due to a single diverticulum status post Endo clipping.  Scattered diverticulosis.   COLONOSCOPY N/A 05/24/2014   Procedure: COLONOSCOPY;  Surgeon: Rogene Houston, MD;  [facility];  Service: Endoscopy;  Laterality: N/A;  1030   COLONOSCOPY N/A 03/15/2019   Diverticulosis, 5 polyps removed.  Pathology revealed tubular adenomas.  Next colonoscopy in 3 years.   COLONOSCOPY WITH PROPOFOL N/A 08/16/2020   Procedure: COLONOSCOPY WITH PROPOFOL;  Surgeon: Rogene Houston, MD;  [facility];  Service: Endoscopy;  Laterality: N/A;   ESOPHAGOGASTRODUODENOSCOPY  12/2009   RMR: Mild esophagitis, gastritis, duodenitis.  Gastric biopsies negative for H. pylori   ESOPHAGOGASTRODUODENOSCOPY (EGD) WITH PROPOFOL N/A 08/15/2020   Procedure: ESOPHAGOGASTRODUODENOSCOPY (EGD) WITH PROPOFOL;  Surgeon: Daneil Dolin, MD;  [facility];  Service: Endoscopy;  Laterality: N/A;  covid + (asymptomatic)   GIVENS CAPSULE STUDY N/A 08/29/2020   Procedure: GIVENS CAPSULE STUDY;  Surgeon: Rogene Houston, MD;  [facility];  Service: Endoscopy;  Laterality: N/A;   POLYPECTOMY  03/15/2019   Procedure: POLYPECTOMY;  Surgeon: Daneil Dolin,  MD;  [facility];  Service: Endoscopy;;    Prior to Admission medications   Medication Sig Start Date End Date Taking? Authorizing Provider  acetaminophen (TYLENOL) 500 MG tablet Take 1,000 mg by mouth every 6 (six) hours as needed for headache.    Yes [provider]  ascorbic acid (VITAMIN C) 1000 MG tablet Take 1,000 mg by mouth in the morning and at bedtime.    Yes [provider]  clopidogrel (PLAVIX) 75 MG tablet Take 1 tablet (75 mg total) by mouth daily. 08/19/20  Yes Barton Dubois, MD  glipiZIDE (GLUCOTROL XL) 5 MG 24 hr tablet Take 5 mg by mouth 2 (two) times daily.   Yes [provider]  glucosamine-chondroitin 500-400 MG tablet Take 1 tablet by mouth daily.   Yes [provider]  lisinopril (PRINIVIL,ZESTRIL) 10 MG tablet Take 10 mg by mouth daily.   Yes [provider]  loperamide (IMODIUM) 2 MG capsule Take by mouth as needed for diarrhea or loose stools.   Yes [provider]  loratadine (CLARITIN) 10 MG tablet Take 10 mg daily by mouth.   Yes [provider]  Magnesium 250 MG TABS Take 1 tablet by mouth daily.   Yes [provider]  metoprolol succinate (TOPROL-XL) 50 MG 24 hr tablet Take 50 mg by mouth daily. 10/23/19  Yes [provider]  Multiple Vitamins-Minerals (MULTIVITAMIN WITH MINERALS) tablet Take 1 tablet by mouth daily.   Yes [provider]  pantoprazole (PROTONIX) 40 MG tablet Take 40 mg by mouth daily.   Yes [provider]  Probiotic, Lactobacillus, CAPS Take 1 capsule by mouth daily.   Yes [provider]  sitaGLIPtin-metformin (JANUMET) 50-1000 MG tablet Take 1 tablet 2 (two) times daily with a meal by mouth.   Yes [provider]  vitamin B-12 (CYANOCOBALAMIN) 1000 MCG tablet Take 1,000 mcg daily by mouth.   Yes [provider]    Allergies as of 01/31/2021   (No Known Allergies)    Family History  Problem Relation Age of Onset   Colon cancer Father    Stroke Father     Social History   Socioeconomic History   Marital status: Married    Spouse name: Not on file   Number of children: Not on file   Years of education: Not on file   Highest education level: Not on file  Occupational History   Not on file  Tobacco Use   Smoking status: Former    Packs/day: 1.00    Years: 40.00    Pack years:  40.00    Types: Cigarettes   Smokeless tobacco: Never  Vaping Use   Vaping Use: Never used  Substance and Sexual Activity   Alcohol use: Yes    Comment: occasionally   Drug use: No   Sexual activity: Not on file  Other Topics Concern   Not on file  Social History Narrative   Not on file   Social Determinants of Health   Financial Resource Strain: Not on file  Food Insecurity: Not on file  Transportation Needs: Not on file  Physical Activity: Not on file  Stress: Not on file  Social Connections: Not on file  Intimate Partner Violence: Not on file    Review of Systems: See HPI, otherwise negative ROS  Physical Exam: BP 122/82    Pulse 70    Temp 97.6 F (36.4 C)    Ht 5\' 10"  (1.778 m)  Wt 205 lb 3.2 oz (93.1 kg)    BMI 29.44 kg/m  General:   Alert,  pleasant and cooperative in NAD; accompanied by his wife. Mouth:  No deformity or lesions. Neck:  Supple; no masses or thyromegaly. No significant cervical adenopathy. Lungs:  Clear throughout to auscultation.   No wheezes, crackles, or rhonchi. No acute distress. Heart:  Regular rate and rhythm; no murmurs, clicks, rubs,  or gallops. Abdomen: Non-distended, normal bowel sounds.  Soft and nontender without appreciable mass or hepatosplenomegaly.   Impression/Plan: 76 year old gentleman with a history of GI bleed last year felt to be diverticular in etiology possibly associated with [place] therapy.  He completed his GI evaluation.  Fortunately, GI bleeding is not recurred. GERD well-controlled on Protonix  Big issue these days is insidiously worsening of chronic nonbloody diarrhea.  He has been exposed to antibiotics over the last several months therefore, C. difficile needs to be considered.  Stool studies remain pending.  If not an infectious etiology, need to consider metformin as a contributing factor.  Less likely would be pancreatic exocrine deficiency, microscopic colitis or solely diabetic visceral  enteropathy.  Recommendations:  As discussed, I recommend 2 mg Imodium tablet every night to suppress loose stools.  Would like  at least 3 bowel movements weekly with minimizing episodes of diarrhea with this regimen  Complete stool studies.  If stool studies are negative, the neck step would be to withdraw metformin from  regimen and and observe for clinical response. Continue Protonix 40 mg daily for reflux.  As discussed, is not felt that a follow-up EGD is warranted.  Further recommendations to follow in the near future once stool results are available for review    Notice: This dictation was prepared with Dragon dictation along with smaller phrase technology. Any transcriptional errors that result from this process are unintentional and may not be corrected upon review.

## 2021-01-31 NOTE — Patient Instructions (Signed)
It was good to see you again today!  As discussed, I recommend you simply take a 2 mg Imodium tablet every night to suppress loose stools.  Would like you to have at least 3 bowel movements weekly but would like to minimize diarrhea with this regimen  Please complete stool studies.  If stool studies are negative, the neck step would be to withdraw metformin from your regimen and see if this helps stool frequency  Continue Protonix 40 mg daily for reflux.  Further recommendations to follow in the near future once stool results are available for review

## 2021-02-06 DIAGNOSIS — A09 Infectious gastroenteritis and colitis, unspecified: Secondary | ICD-10-CM | POA: Diagnosis not present

## 2021-02-06 DIAGNOSIS — K921 Melena: Secondary | ICD-10-CM | POA: Diagnosis not present

## 2021-02-06 DIAGNOSIS — K219 Gastro-esophageal reflux disease without esophagitis: Secondary | ICD-10-CM | POA: Diagnosis not present

## 2021-02-07 LAB — GASTROINTESTINAL PATHOGEN PANEL PCR
C. difficile Tox A/B, PCR: NOT DETECTED
Campylobacter, PCR: NOT DETECTED
Cryptosporidium, PCR: NOT DETECTED
E coli (ETEC) LT/ST PCR: NOT DETECTED
E coli (STEC) stx1/stx2, PCR: NOT DETECTED
E coli 0157, PCR: NOT DETECTED
Giardia lamblia, PCR: NOT DETECTED
Norovirus, PCR: NOT DETECTED
Rotavirus A, PCR: NOT DETECTED
Salmonella, PCR: NOT DETECTED
Shigella, PCR: NOT DETECTED

## 2021-02-07 LAB — C. DIFFICILE GDH AND TOXIN A/B
GDH ANTIGEN: NOT DETECTED
MICRO NUMBER:: 12926758
SPECIMEN QUALITY:: ADEQUATE
TOXIN A AND B: NOT DETECTED

## 2021-02-11 DIAGNOSIS — E782 Mixed hyperlipidemia: Secondary | ICD-10-CM | POA: Diagnosis not present

## 2021-02-11 DIAGNOSIS — E1140 Type 2 diabetes mellitus with diabetic neuropathy, unspecified: Secondary | ICD-10-CM | POA: Diagnosis not present

## 2021-02-11 DIAGNOSIS — I10 Essential (primary) hypertension: Secondary | ICD-10-CM | POA: Diagnosis not present

## 2021-02-11 DIAGNOSIS — K219 Gastro-esophageal reflux disease without esophagitis: Secondary | ICD-10-CM | POA: Diagnosis not present

## 2021-02-17 ENCOUNTER — Telehealth: Payer: Self-pay

## 2021-02-17 NOTE — Telephone Encounter (Signed)
Pt's wife called regarding the pt's recent labs. Pt seen Dr. Gala Romney recently and states that Dr. Gala Romney was going to look into Metformin being the cause of the pt's diarrhea instead of an infectious cause. Pt is going to his PCP on Wed and the wife is wanting to know if they need to let the PCP know so that they can decide whether to take the pt off of metformin or not.

## 2021-02-17 NOTE — Telephone Encounter (Signed)
Stool test were negative for infection. Next step per Dr. Roseanne Kaufman plan is to have him hold metformin containing medications (sitagliptin-metformin) and see if his diarrhea improves. He can definitely discuss with his PCP this week.

## 2021-02-18 NOTE — Telephone Encounter (Signed)
Pt's wife was made aware and states that she will have pcp switch pt to a different medication.

## 2021-02-19 DIAGNOSIS — E789 Disorder of lipoprotein metabolism, unspecified: Secondary | ICD-10-CM | POA: Diagnosis not present

## 2021-02-19 DIAGNOSIS — I10 Essential (primary) hypertension: Secondary | ICD-10-CM | POA: Diagnosis not present

## 2021-02-19 DIAGNOSIS — Z6830 Body mass index (BMI) 30.0-30.9, adult: Secondary | ICD-10-CM | POA: Diagnosis not present

## 2021-02-19 DIAGNOSIS — E1140 Type 2 diabetes mellitus with diabetic neuropathy, unspecified: Secondary | ICD-10-CM | POA: Diagnosis not present

## 2021-02-19 DIAGNOSIS — E782 Mixed hyperlipidemia: Secondary | ICD-10-CM | POA: Diagnosis not present

## 2021-02-19 DIAGNOSIS — Z8042 Family history of malignant neoplasm of prostate: Secondary | ICD-10-CM | POA: Diagnosis not present

## 2021-02-19 DIAGNOSIS — E6609 Other obesity due to excess calories: Secondary | ICD-10-CM | POA: Diagnosis not present

## 2021-02-19 DIAGNOSIS — E1165 Type 2 diabetes mellitus with hyperglycemia: Secondary | ICD-10-CM | POA: Diagnosis not present

## 2021-02-19 DIAGNOSIS — Z86010 Personal history of colonic polyps: Secondary | ICD-10-CM | POA: Diagnosis not present

## 2021-02-19 DIAGNOSIS — Z87440 Personal history of urinary (tract) infections: Secondary | ICD-10-CM | POA: Diagnosis not present

## 2021-02-19 DIAGNOSIS — E8881 Metabolic syndrome: Secondary | ICD-10-CM | POA: Diagnosis not present

## 2021-02-19 DIAGNOSIS — Z125 Encounter for screening for malignant neoplasm of prostate: Secondary | ICD-10-CM | POA: Diagnosis not present

## 2021-02-19 DIAGNOSIS — N451 Epididymitis: Secondary | ICD-10-CM | POA: Diagnosis not present

## 2021-02-19 DIAGNOSIS — Z1331 Encounter for screening for depression: Secondary | ICD-10-CM | POA: Diagnosis not present

## 2021-02-19 DIAGNOSIS — D510 Vitamin B12 deficiency anemia due to intrinsic factor deficiency: Secondary | ICD-10-CM | POA: Diagnosis not present

## 2021-02-19 DIAGNOSIS — Z789 Other specified health status: Secondary | ICD-10-CM | POA: Diagnosis not present

## 2021-02-24 IMAGING — CT CT CHEST LUNG CANCER SCREENING LOW DOSE W/O CM
1 of 3 series · 10 of 30 positions shown, 13 images · non-contrast
Comparison: 06/28/2017.

CLINICAL DATA: Lung cancer screening. Former asymptomatic smoker.
Forty pack-year history.

EXAM:
CT CHEST WITHOUT CONTRAST LOW-DOSE FOR LUNG CANCER SCREENING
TECHNIQUE: Multidetector CT imaging of the chest was performed following the
standard protocol without IV contrast.

[ct lung segmentation data · axial · 0.73mm/px · z∈[-234,-234]mm · 10 of 247 frames shown]
[frame 1/247  mediastinal]
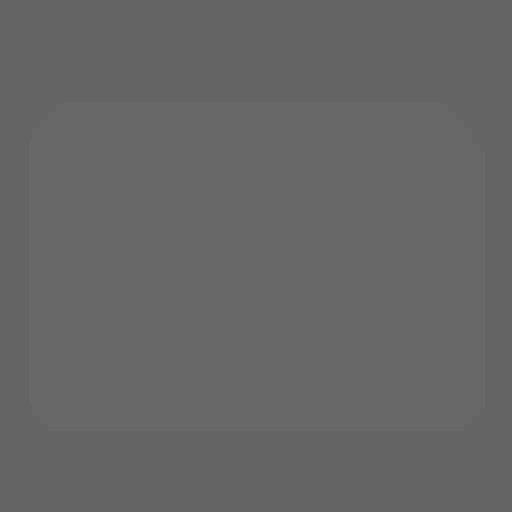
[frame 1/247  lung]
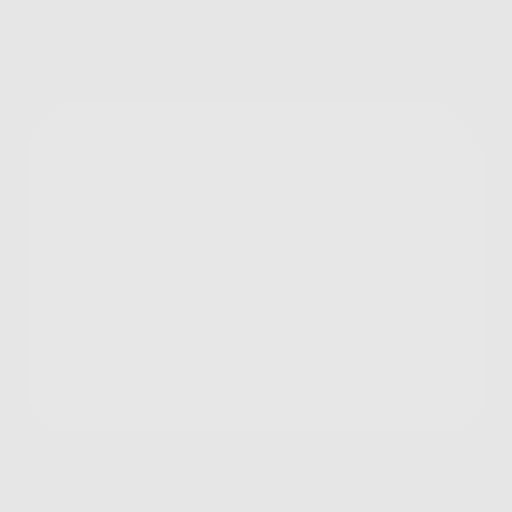
[frame 28/247  lung]
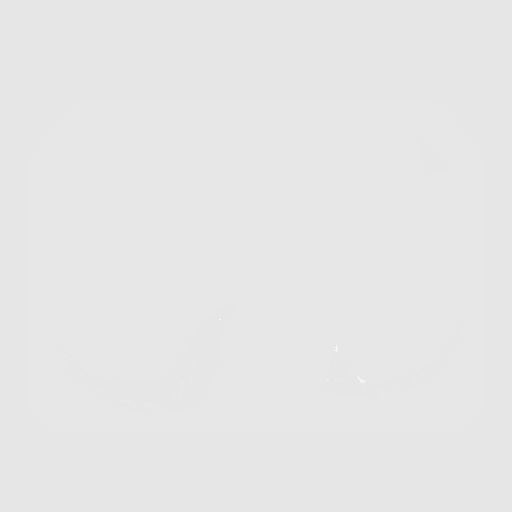
[frame 55/247  lung]
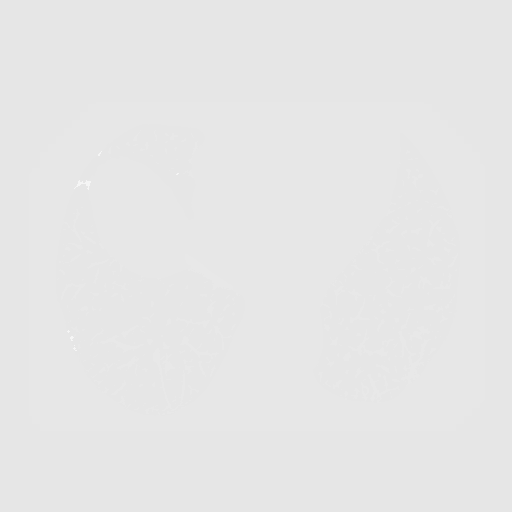
[frame 83/247  lung]
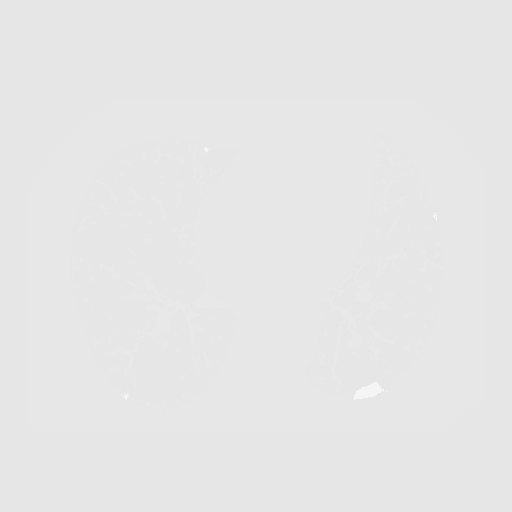
[frame 110/247  mediastinal]
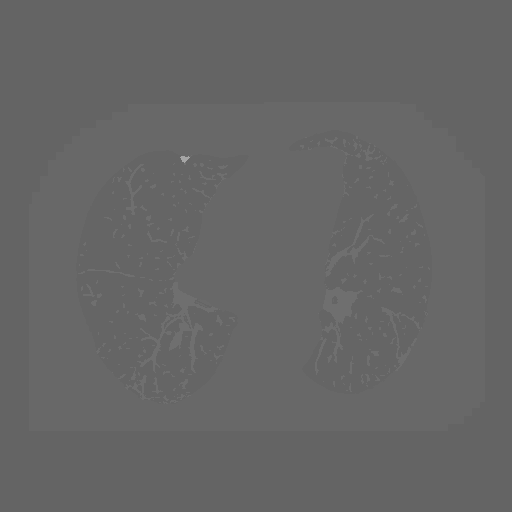
[frame 110/247  lung]
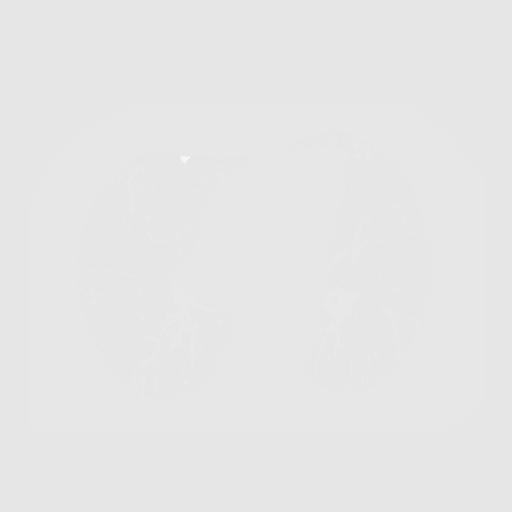
[frame 137/247  lung]
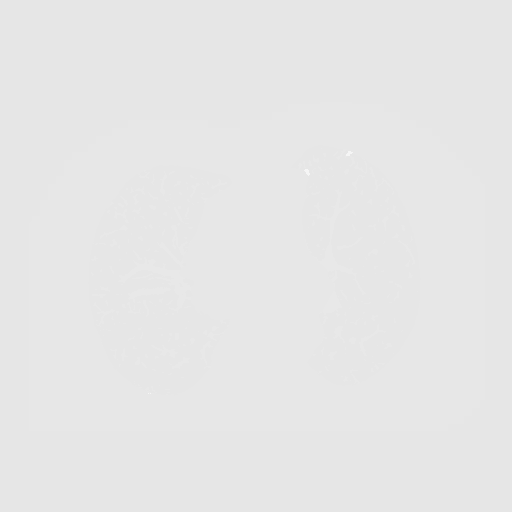
[frame 165/247  lung]
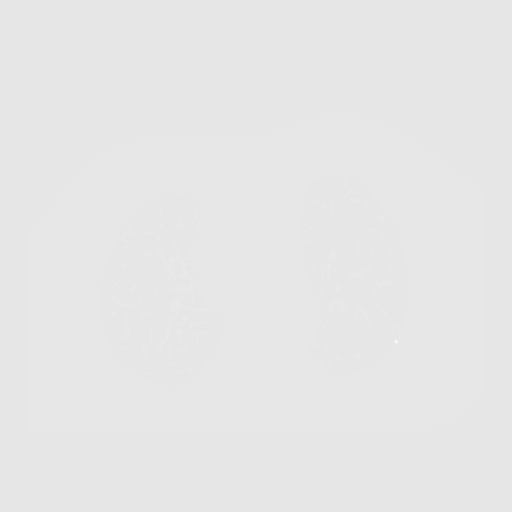
[frame 192/247  lung]
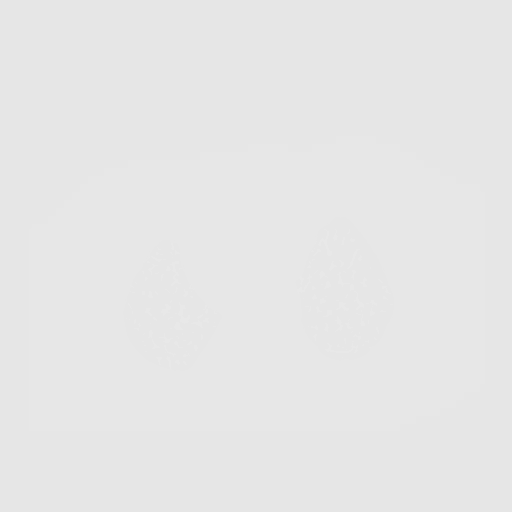
[frame 219/247  mediastinal]
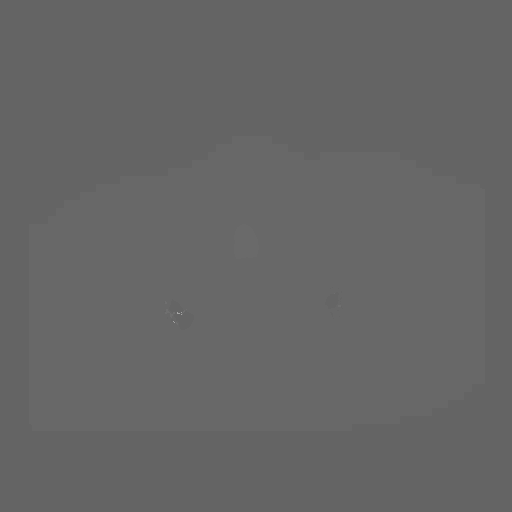
[frame 219/247  lung]
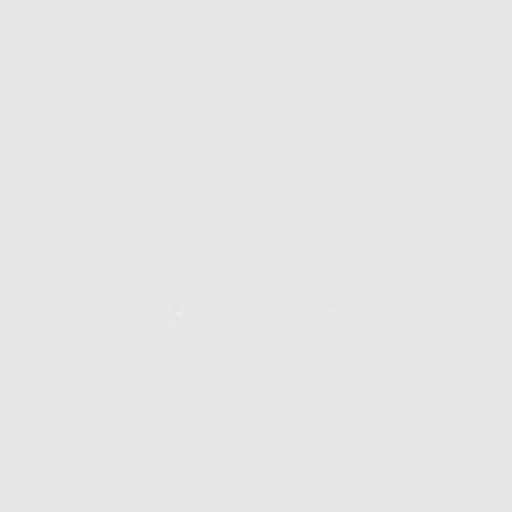
[frame 247/247  lung]
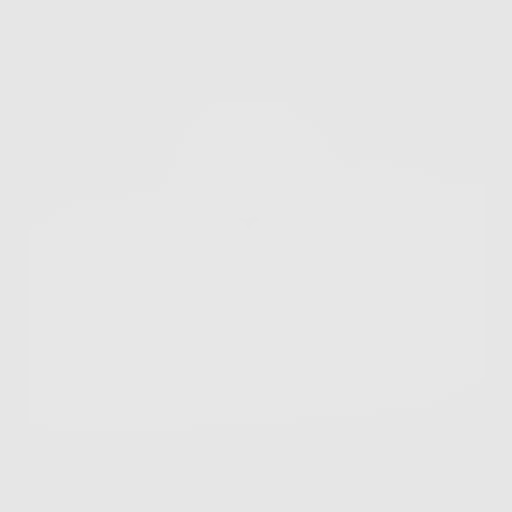

[10 of 30 positions shown; findings below may reference images not displayed]

FINDINGS: Cardiovascular: Normal heart size. Aortic atherosclerosis. Three
vessel coronary artery atherosclerotic calcifications.

Mediastinum/Nodes: No enlarged mediastinal, hilar, or axillary lymph
nodes. Thyroid gland, trachea, and esophagus demonstrate no
significant findings.

Lungs/Pleura: Left-sided partially calcified pleural nodules are
unchanged. No pleural effusion identified. Moderate to advanced
centrilobular and paraseptal emphysema. Multiple calcified and
noncalcified pulmonary nodules are identified. These are stable
compared with previous study. The largest noncalcified nodule has a
mean derived diameter of 7.1 mm and is not significantly changed in
size from previous exam. No new suspicious lung nodules.

Upper Abdomen: No acute abnormality.

Musculoskeletal: No acute abnormality. Degenerative disc disease is
noted within the lower thoracic spine.
IMPRESSION: 1. Lung-RADS 2, benign appearance or behavior. Continue annual
screening with low-dose chest CT without contrast in 12 months.
2. Emphysema and aortic atherosclerosis.
3. Three vessel coronary artery atherosclerotic calcifications.

Aortic Atherosclerosis (OX1P5-A3A.A) and Emphysema (OX1P5-5MY.5).

## 2021-03-07 ENCOUNTER — Other Ambulatory Visit (INDEPENDENT_AMBULATORY_CARE_PROVIDER_SITE_OTHER): Payer: Self-pay | Admitting: *Deleted

## 2021-03-07 DIAGNOSIS — D500 Iron deficiency anemia secondary to blood loss (chronic): Secondary | ICD-10-CM

## 2021-03-10 DIAGNOSIS — D500 Iron deficiency anemia secondary to blood loss (chronic): Secondary | ICD-10-CM | POA: Diagnosis not present

## 2021-03-10 LAB — CBC WITH DIFFERENTIAL/PLATELET
Absolute Monocytes: 1102 cells/uL — ABNORMAL HIGH (ref 200–950)
Basophils Absolute: 96 cells/uL (ref 0–200)
Basophils Relative: 0.9 %
Eosinophils Absolute: 385 cells/uL (ref 15–500)
Eosinophils Relative: 3.6 %
HCT: 40.2 % (ref 38.5–50.0)
Hemoglobin: 12.8 g/dL — ABNORMAL LOW (ref 13.2–17.1)
Lymphs Abs: 1851 cells/uL (ref 850–3900)
MCH: 29 pg (ref 27.0–33.0)
MCHC: 31.8 g/dL — ABNORMAL LOW (ref 32.0–36.0)
MCV: 91.2 fL (ref 80.0–100.0)
MPV: 10.3 fL (ref 7.5–12.5)
Monocytes Relative: 10.3 %
Neutro Abs: 7265 cells/uL (ref 1500–7800)
Neutrophils Relative %: 67.9 %
Platelets: 265 10*3/uL (ref 140–400)
RBC: 4.41 10*6/uL (ref 4.20–5.80)
RDW: 14.6 % (ref 11.0–15.0)
Total Lymphocyte: 17.3 %
WBC: 10.7 10*3/uL (ref 3.8–10.8)

## 2021-03-11 DIAGNOSIS — E1140 Type 2 diabetes mellitus with diabetic neuropathy, unspecified: Secondary | ICD-10-CM | POA: Diagnosis not present

## 2021-03-11 DIAGNOSIS — I10 Essential (primary) hypertension: Secondary | ICD-10-CM | POA: Diagnosis not present

## 2021-03-11 DIAGNOSIS — E782 Mixed hyperlipidemia: Secondary | ICD-10-CM | POA: Diagnosis not present

## 2021-03-11 DIAGNOSIS — K219 Gastro-esophageal reflux disease without esophagitis: Secondary | ICD-10-CM | POA: Diagnosis not present

## 2021-04-03 ENCOUNTER — Encounter: Payer: Self-pay | Admitting: Internal Medicine

## 2021-04-10 DIAGNOSIS — E1140 Type 2 diabetes mellitus with diabetic neuropathy, unspecified: Secondary | ICD-10-CM | POA: Diagnosis not present

## 2021-04-10 DIAGNOSIS — D510 Vitamin B12 deficiency anemia due to intrinsic factor deficiency: Secondary | ICD-10-CM | POA: Diagnosis not present

## 2021-04-10 DIAGNOSIS — Z6830 Body mass index (BMI) 30.0-30.9, adult: Secondary | ICD-10-CM | POA: Diagnosis not present

## 2021-04-10 DIAGNOSIS — E6609 Other obesity due to excess calories: Secondary | ICD-10-CM | POA: Diagnosis not present

## 2021-04-10 DIAGNOSIS — I10 Essential (primary) hypertension: Secondary | ICD-10-CM | POA: Diagnosis not present

## 2021-04-11 DIAGNOSIS — E1140 Type 2 diabetes mellitus with diabetic neuropathy, unspecified: Secondary | ICD-10-CM | POA: Diagnosis not present

## 2021-04-11 DIAGNOSIS — I10 Essential (primary) hypertension: Secondary | ICD-10-CM | POA: Diagnosis not present

## 2021-04-11 DIAGNOSIS — K219 Gastro-esophageal reflux disease without esophagitis: Secondary | ICD-10-CM | POA: Diagnosis not present

## 2021-04-11 DIAGNOSIS — E782 Mixed hyperlipidemia: Secondary | ICD-10-CM | POA: Diagnosis not present

## 2021-04-29 ENCOUNTER — Encounter: Payer: Self-pay | Admitting: Internal Medicine

## 2021-04-29 ENCOUNTER — Ambulatory Visit (INDEPENDENT_AMBULATORY_CARE_PROVIDER_SITE_OTHER): Payer: Medicare Other | Admitting: Internal Medicine

## 2021-04-29 VITALS — BP 118/62 | HR 99 | Temp 97.5°F | Ht 70.0 in | Wt 205.8 lb

## 2021-04-29 DIAGNOSIS — K625 Hemorrhage of anus and rectum: Secondary | ICD-10-CM | POA: Diagnosis not present

## 2021-04-29 DIAGNOSIS — K219 Gastro-esophageal reflux disease without esophagitis: Secondary | ICD-10-CM

## 2021-04-29 MED ORDER — PANTOPRAZOLE SODIUM 40 MG PO TBEC: 40.0000 mg | DELAYED_RELEASE_TABLET | Freq: Every day | ORAL | 3 refills | Status: AC

## 2021-04-29 NOTE — Progress Notes (Signed)
? ? ?Primary Care Physician:  Sharilyn Sites, MD ?Primary Gastroenterologist:  Dr.  Marland Kitchen ?Pre-Procedure History & Physical: ?HPI:  David Pruitt is a 76 y.o. male here for follow-up of "diarrhea".  Patient reports that he really has [place] 3/4 stools most of the time  -  occasionally will have a 4.  Occasionally goes upwards of 2 days without a bowel movement.  No more than 4 on any given day -  no nocturnal bowel function.  Never watery diarrhea; no incontinence  Passes a slight amount of blood per rectum from time to time.  Known internal hemorrhoids.  On clopidogrel. ? ?Episode of significant GI bleeding last year for which she underwent EGD, capsule and colonoscopy without significant findings.  H&H 12.8 and 40.22 months ago. ? ?Father with a history of colon cancer; he will be due for surveillance colonoscopy in 4 years. ? ?Past Medical History:  ?Diagnosis Date  ? CKD (chronic kidney disease), stage IIIa ([place]) 08/15/2020  ? CVA (cerebral infarction)   ? 2006  ? DM (dermatomyositis)   ? Gastritis   ? GERD (gastroesophageal reflux disease)   ? Hemorrhoids   ? Hypercholesteremia   ? Hypertension   ? Lower GI bleed   ? SECONDARY TO DIVERTICULA,S/P BLEED  ? Overweight (BMI 25.0-29.9) 08/15/2020  ? Shortness of breath dyspnea   ? Type 2 diabetes mellitus ([place])   ? ? ?Past Surgical History:  ?Procedure Laterality Date  ? COLONOSCOPY  12/2009  ? RMR: Lower GI bleed due to a single diverticulum status post Endo clipping.  Scattered diverticulosis.  ? COLONOSCOPY N/A 05/24/2014  ? Procedure: COLONOSCOPY;  Surgeon: Rogene Houston, MD;  [facility];  Service: Endoscopy;  Laterality: N/A;  1030  ? COLONOSCOPY N/A 03/15/2019  ? Diverticulosis, 5 polyps removed.  Pathology revealed tubular adenomas.  Next colonoscopy in 3 years.  ? COLONOSCOPY WITH PROPOFOL N/A 08/16/2020  ? Procedure: COLONOSCOPY WITH PROPOFOL;  Surgeon: Rogene Houston, MD;  [facility];  Service: Endoscopy;  Laterality: N/A;  ?  ESOPHAGOGASTRODUODENOSCOPY  12/2009  ? RMR: Mild esophagitis, gastritis, duodenitis.  Gastric biopsies negative for H. pylori  ? ESOPHAGOGASTRODUODENOSCOPY (EGD) WITH PROPOFOL N/A 08/15/2020  ? Procedure: ESOPHAGOGASTRODUODENOSCOPY (EGD) WITH PROPOFOL;  Surgeon: Daneil Dolin, MD;  [facility];  Service: Endoscopy;  Laterality: N/A;  covid + (asymptomatic)  ? GIVENS CAPSULE STUDY N/A 08/29/2020  ? Procedure: GIVENS CAPSULE STUDY;  Surgeon: Rogene Houston, MD;  [facility];  Service: Endoscopy;  Laterality: N/A;  ? POLYPECTOMY  03/15/2019  ? Procedure: POLYPECTOMY;  Surgeon: Daneil Dolin, MD;  [facility];  Service: Endoscopy;;  ? ? ?Prior to Admission medications   ?Medication Sig Start Date End Date Taking? Authorizing Provider  ?acetaminophen (TYLENOL) 500 MG tablet Take 1,000 mg by mouth every 6 (six) hours as needed for headache.    Yes [provider]  ?ascorbic acid (VITAMIN C) 1000 MG tablet Take 1,000 mg by mouth in the morning and at bedtime.   Yes [provider]  ?clopidogrel (PLAVIX) 75 MG tablet Take 1 tablet (75 mg total) by mouth daily. 08/19/20  Yes Barton Dubois, MD  ?glipiZIDE (GLUCOTROL XL) 5 MG 24 hr tablet Take 5 mg by mouth 2 (two) times daily.   Yes [provider]  ?glucosamine-chondroitin 500-400 MG tablet Take 1 tablet by mouth daily.   Yes [provider]  ?JANUVIA 100 MG tablet Take 100 mg  by mouth daily. 02/19/21  Yes [provider]  ?lisinopril (PRINIVIL,ZESTRIL) 10 MG tablet Take 10 mg by mouth daily.   Yes [provider]  ?loperamide (IMODIUM) 2 MG capsule Take by mouth as needed for diarrhea or loose stools.   Yes [provider]  ?loratadine (CLARITIN) 10 MG tablet Take 10 mg daily by mouth.   Yes [provider]  ?Magnesium 250 MG TABS Take 1 tablet by mouth daily.   Yes [provider]  ?metoprolol succinate (TOPROL-XL) 50 MG 24 hr tablet Take 50 mg by mouth daily.  10/23/19  Yes [provider]  ?Multiple Vitamins-Minerals (MULTIVITAMIN WITH MINERALS) tablet Take 1 tablet by mouth daily.   Yes [provider]  ?OZEMPIC, 0.25 OR 0.5 MG/DOSE, 2 MG/1.5ML SOPN Inject 0.5 mg as directed once a week. 03/03/21  Yes [provider]  ?pantoprazole (PROTONIX) 40 MG tablet Take 40 mg by mouth daily.   Yes [provider]  ?Probiotic, Lactobacillus, CAPS Take 1 capsule by mouth daily.   Yes [provider]  ?vitamin B-12 (CYANOCOBALAMIN) 1000 MCG tablet Take 1,000 mcg daily by mouth.   Yes [provider]  ? ? ?Allergies as of 04/29/2021  ? (No Known Allergies)  ? ? ?Family History  ?Problem Relation Age of Onset  ? Colon cancer Father   ? Stroke Father   ? ? ?Social History  ? ?Socioeconomic History  ? Marital status: Married  ?  Spouse name: Not on file  ? Number of children: Not on file  ? Years of education: Not on file  ? Highest education level: Not on file  ?Occupational History  ? Not on file  ?Tobacco Use  ? Smoking status: Former  ?  Packs/day: 1.00  ?  Years: 40.00  ?  Pack years: 40.00  ?  Types: Cigarettes  ? Smokeless tobacco: Never  ?Vaping Use  ? Vaping Use: Never used  ?Substance and Sexual Activity  ? Alcohol use: Yes  ?  Comment: occasionally  ? Drug use: No  ? Sexual activity: Yes  ?  Partners: Female  ?  Comment: spouse  ?Other Topics Concern  ? Not on file  ?Social History Narrative  ? Not on file  ? ?Social Determinants of Health  ? ?Financial Resource Strain: Not on file  ?Food Insecurity: Not on file  ?Transportation Needs: Not on file  ?Physical Activity: Not on file  ?Stress: Not on file  ?Social Connections: Not on file  ?Intimate Partner Violence: Not on file  ? ? ?Review of Systems: ?See HPI, otherwise negative ROS ? ?Physical Exam: ?BP 118/62 (BP Location: Right Arm, Patient Position: Sitting, Cuff Size: Large)   Pulse 99   Temp (!) 97.5 ?F (36.4 ?C) (Temporal)   Ht '5\' 10"'$  (1.778 m)   Wt 205 lb 12.8 oz  (93.4 kg)   SpO2 98%   BMI 29.53 kg/m?  ?General:   Alert,  Well-developed, well-nourished, pleasant and cooperative in NAD; accompanied by spouse. ?Neck:  Supple; no masses or thyromegaly. No significant cervical adenopathy. ?Lungs:  Clear throughout to auscultation.   No wheezes, crackles, or rhonchi. No acute distress. ?Heart:  Regular rate and rhythm; no murmurs, clicks, rubs,  or gallops. ?Abdomen: Non-distended, normal bowel sounds.  Soft and nontender without appreciable mass or hepatosplenomegaly.  ?Pulses:  Normal pulses noted. ?Extremities:  Without clubbing or edema. ? ?Impression/Plan: 76 year old gentleman with GERD well-controlled on pantoprazole.  History of intermittent abdominal,cramps, fecal urgency.  Really  has not had any out and out diarrhea as described above.  He does have periods of urgency.  No alarm symptoms; he does have some paper hematochezia but this is likely hemorrhoidal in origin in the setting of antiplatelet therapy given his complete evaluation 1 year ago. ?I think we can get more mileage out of Imodium.  We discussed a on-demand prescription agent such as Levsin sublingual ? ?Recommendations ? ?Continue pantoprazole or Protonix 40 mg 30 minutes to an hour before breakfast every day (new prescription dispense 90 with 3 refills) ? ?As far as bowel function is concerned, may increase Imodium to 2 or even 3 times daily as needed to preempt bouts of bowel urgency. ? ?Spend no more than 5 minutes on the toilet.  If you cannot accomplish a good bowel movement in that period of time, get up do something else and try again later ? ?Recommend repeat colonoscopy in 4 years to check for polyps ? ?Office visit here in 6 months ? ? ? ? ?Notice: This dictation was prepared with Dragon dictation along with smaller phrase technology. Any transcriptional errors that result from this process are unintentional and may not be corrected upon review.  ?

## 2021-04-29 NOTE — Patient Instructions (Signed)
It was good to see you again today! ? ?Continue pantoprazole or Protonix 40 mg 30 minutes to an hour before breakfast every day (new prescription dispense 90 with 3 refills) ? ?As far as bowel function is concerned, may increase Imodium to 2 or even 3 times daily as needed to preempt bouts of bowel urgency. ? ?Spend no more than 5 minutes on the toilet.  If you cannot accomplish a good bowel movement in that period of time, get up do something else and try again later ? ?Recommend repeat colonoscopy in 4 years to check for polyps ? ?Office visit here in 6 months ?

## 2021-05-11 DIAGNOSIS — I10 Essential (primary) hypertension: Secondary | ICD-10-CM | POA: Diagnosis not present

## 2021-05-11 DIAGNOSIS — E1140 Type 2 diabetes mellitus with diabetic neuropathy, unspecified: Secondary | ICD-10-CM | POA: Diagnosis not present

## 2021-05-11 DIAGNOSIS — K219 Gastro-esophageal reflux disease without esophagitis: Secondary | ICD-10-CM | POA: Diagnosis not present

## 2021-05-11 DIAGNOSIS — E782 Mixed hyperlipidemia: Secondary | ICD-10-CM | POA: Diagnosis not present

## 2021-05-15 ENCOUNTER — Other Ambulatory Visit: Payer: Self-pay | Admitting: Family Medicine

## 2021-05-15 ENCOUNTER — Other Ambulatory Visit (HOSPITAL_COMMUNITY): Payer: Self-pay | Admitting: Family Medicine

## 2021-05-15 DIAGNOSIS — Z87891 Personal history of nicotine dependence: Secondary | ICD-10-CM

## 2021-05-20 DIAGNOSIS — E782 Mixed hyperlipidemia: Secondary | ICD-10-CM | POA: Diagnosis not present

## 2021-05-20 DIAGNOSIS — Z1331 Encounter for screening for depression: Secondary | ICD-10-CM | POA: Diagnosis not present

## 2021-05-20 DIAGNOSIS — Z0001 Encounter for general adult medical examination with abnormal findings: Secondary | ICD-10-CM | POA: Diagnosis not present

## 2021-05-20 DIAGNOSIS — E118 Type 2 diabetes mellitus with unspecified complications: Secondary | ICD-10-CM | POA: Diagnosis not present

## 2021-05-20 DIAGNOSIS — E1140 Type 2 diabetes mellitus with diabetic neuropathy, unspecified: Secondary | ICD-10-CM | POA: Diagnosis not present

## 2021-05-20 DIAGNOSIS — E669 Obesity, unspecified: Secondary | ICD-10-CM | POA: Diagnosis not present

## 2021-05-20 DIAGNOSIS — Z6830 Body mass index (BMI) 30.0-30.9, adult: Secondary | ICD-10-CM | POA: Diagnosis not present

## 2021-05-20 DIAGNOSIS — I10 Essential (primary) hypertension: Secondary | ICD-10-CM | POA: Diagnosis not present

## 2021-05-30 ENCOUNTER — Other Ambulatory Visit (INDEPENDENT_AMBULATORY_CARE_PROVIDER_SITE_OTHER): Payer: Self-pay | Admitting: *Deleted

## 2021-05-30 DIAGNOSIS — D500 Iron deficiency anemia secondary to blood loss (chronic): Secondary | ICD-10-CM

## 2021-05-30 DIAGNOSIS — D72829 Elevated white blood cell count, unspecified: Secondary | ICD-10-CM

## 2021-06-11 DIAGNOSIS — K219 Gastro-esophageal reflux disease without esophagitis: Secondary | ICD-10-CM | POA: Diagnosis not present

## 2021-06-11 DIAGNOSIS — E782 Mixed hyperlipidemia: Secondary | ICD-10-CM | POA: Diagnosis not present

## 2021-06-11 DIAGNOSIS — E1140 Type 2 diabetes mellitus with diabetic neuropathy, unspecified: Secondary | ICD-10-CM | POA: Diagnosis not present

## 2021-06-11 DIAGNOSIS — I10 Essential (primary) hypertension: Secondary | ICD-10-CM | POA: Diagnosis not present

## 2021-06-14 DIAGNOSIS — S300XXA Contusion of lower back and pelvis, initial encounter: Secondary | ICD-10-CM | POA: Diagnosis not present

## 2021-06-14 DIAGNOSIS — M25512 Pain in left shoulder: Secondary | ICD-10-CM | POA: Diagnosis not present

## 2021-06-16 ENCOUNTER — Encounter (HOSPITAL_COMMUNITY): Payer: Self-pay

## 2021-06-16 ENCOUNTER — Ambulatory Visit (HOSPITAL_COMMUNITY)
Admission: RE | Admit: 2021-06-16 | Discharge: 2021-06-16 | Disposition: A | Discharge status: Home / Self Care | Payer: Medicare Other | Source: Ambulatory Visit | Attending: Family Medicine | Admitting: Family Medicine

## 2021-06-16 DIAGNOSIS — Z87891 Personal history of nicotine dependence: Secondary | ICD-10-CM

## 2021-06-17 DIAGNOSIS — S40912A Unspecified superficial injury of left shoulder, initial encounter: Secondary | ICD-10-CM | POA: Diagnosis not present

## 2021-06-17 DIAGNOSIS — S3210XA Unspecified fracture of sacrum, initial encounter for closed fracture: Secondary | ICD-10-CM | POA: Diagnosis not present

## 2021-06-17 DIAGNOSIS — R0789 Other chest pain: Secondary | ICD-10-CM | POA: Diagnosis not present

## 2021-06-17 DIAGNOSIS — W108XXA Fall (on) (from) other stairs and steps, initial encounter: Secondary | ICD-10-CM | POA: Diagnosis not present

## 2021-06-17 DIAGNOSIS — R0781 Pleurodynia: Secondary | ICD-10-CM | POA: Diagnosis not present

## 2021-06-17 DIAGNOSIS — S4992XA Unspecified injury of left shoulder and upper arm, initial encounter: Secondary | ICD-10-CM | POA: Diagnosis not present

## 2021-06-17 DIAGNOSIS — R918 Other nonspecific abnormal finding of lung field: Secondary | ICD-10-CM | POA: Diagnosis not present

## 2021-06-17 DIAGNOSIS — Z043 Encounter for examination and observation following other accident: Secondary | ICD-10-CM | POA: Diagnosis not present

## 2021-06-23 DIAGNOSIS — S4992XA Unspecified injury of left shoulder and upper arm, initial encounter: Secondary | ICD-10-CM | POA: Diagnosis not present

## 2021-06-23 DIAGNOSIS — S3210XA Unspecified fracture of sacrum, initial encounter for closed fracture: Secondary | ICD-10-CM | POA: Diagnosis not present

## 2021-06-23 DIAGNOSIS — W108XXA Fall (on) (from) other stairs and steps, initial encounter: Secondary | ICD-10-CM | POA: Diagnosis not present

## 2021-06-25 DIAGNOSIS — E0840 Diabetes mellitus due to underlying condition with diabetic neuropathy, unspecified: Secondary | ICD-10-CM | POA: Diagnosis not present

## 2021-06-25 DIAGNOSIS — I10 Essential (primary) hypertension: Secondary | ICD-10-CM | POA: Diagnosis not present

## 2021-06-25 DIAGNOSIS — E7800 Pure hypercholesterolemia, unspecified: Secondary | ICD-10-CM | POA: Diagnosis not present

## 2021-06-25 DIAGNOSIS — E1165 Type 2 diabetes mellitus with hyperglycemia: Secondary | ICD-10-CM | POA: Diagnosis not present

## 2021-07-11 DIAGNOSIS — I10 Essential (primary) hypertension: Secondary | ICD-10-CM | POA: Diagnosis not present

## 2021-07-11 DIAGNOSIS — E1140 Type 2 diabetes mellitus with diabetic neuropathy, unspecified: Secondary | ICD-10-CM | POA: Diagnosis not present

## 2021-07-11 DIAGNOSIS — K219 Gastro-esophageal reflux disease without esophagitis: Secondary | ICD-10-CM | POA: Diagnosis not present

## 2021-07-11 DIAGNOSIS — E782 Mixed hyperlipidemia: Secondary | ICD-10-CM | POA: Diagnosis not present

## 2021-07-17 DIAGNOSIS — E1165 Type 2 diabetes mellitus with hyperglycemia: Secondary | ICD-10-CM | POA: Diagnosis not present

## 2021-07-17 DIAGNOSIS — E7800 Pure hypercholesterolemia, unspecified: Secondary | ICD-10-CM | POA: Diagnosis not present

## 2021-07-17 DIAGNOSIS — I10 Essential (primary) hypertension: Secondary | ICD-10-CM | POA: Diagnosis not present

## 2021-08-04 ENCOUNTER — Other Ambulatory Visit (HOSPITAL_BASED_OUTPATIENT_CLINIC_OR_DEPARTMENT_OTHER): Payer: Self-pay

## 2021-08-04 MED ORDER — OZEMPIC (2 MG/DOSE) 8 MG/3ML ~~LOC~~ SOPN
PEN_INJECTOR | SUBCUTANEOUS | 1 refills | Status: DC
  Filled 2021-08-04 – 2021-08-15 (×5): qty 3, 28d supply, fill #0
  Filled 2021-09-16: qty 3, 28d supply, fill #1

## 2021-08-11 ENCOUNTER — Other Ambulatory Visit (HOSPITAL_BASED_OUTPATIENT_CLINIC_OR_DEPARTMENT_OTHER): Payer: Self-pay

## 2021-08-11 DIAGNOSIS — I10 Essential (primary) hypertension: Secondary | ICD-10-CM | POA: Diagnosis not present

## 2021-08-11 DIAGNOSIS — K219 Gastro-esophageal reflux disease without esophagitis: Secondary | ICD-10-CM | POA: Diagnosis not present

## 2021-08-11 DIAGNOSIS — E1140 Type 2 diabetes mellitus with diabetic neuropathy, unspecified: Secondary | ICD-10-CM | POA: Diagnosis not present

## 2021-08-11 DIAGNOSIS — E782 Mixed hyperlipidemia: Secondary | ICD-10-CM | POA: Diagnosis not present

## 2021-08-13 ENCOUNTER — Other Ambulatory Visit (HOSPITAL_BASED_OUTPATIENT_CLINIC_OR_DEPARTMENT_OTHER): Payer: Self-pay

## 2021-08-15 ENCOUNTER — Other Ambulatory Visit (HOSPITAL_BASED_OUTPATIENT_CLINIC_OR_DEPARTMENT_OTHER): Payer: Self-pay

## 2021-09-16 ENCOUNTER — Other Ambulatory Visit (HOSPITAL_BASED_OUTPATIENT_CLINIC_OR_DEPARTMENT_OTHER): Payer: Self-pay

## 2021-09-18 DIAGNOSIS — E7800 Pure hypercholesterolemia, unspecified: Secondary | ICD-10-CM | POA: Diagnosis not present

## 2021-09-18 DIAGNOSIS — E1165 Type 2 diabetes mellitus with hyperglycemia: Secondary | ICD-10-CM | POA: Diagnosis not present

## 2021-09-24 ENCOUNTER — Other Ambulatory Visit (HOSPITAL_BASED_OUTPATIENT_CLINIC_OR_DEPARTMENT_OTHER): Payer: Self-pay

## 2021-09-24 DIAGNOSIS — I10 Essential (primary) hypertension: Secondary | ICD-10-CM | POA: Diagnosis not present

## 2021-09-24 DIAGNOSIS — E0840 Diabetes mellitus due to underlying condition with diabetic neuropathy, unspecified: Secondary | ICD-10-CM | POA: Diagnosis not present

## 2021-09-24 DIAGNOSIS — E1165 Type 2 diabetes mellitus with hyperglycemia: Secondary | ICD-10-CM | POA: Diagnosis not present

## 2021-09-24 DIAGNOSIS — Z8673 Personal history of transient ischemic attack (TIA), and cerebral infarction without residual deficits: Secondary | ICD-10-CM | POA: Diagnosis not present

## 2021-09-24 DIAGNOSIS — E7800 Pure hypercholesterolemia, unspecified: Secondary | ICD-10-CM | POA: Diagnosis not present

## 2021-09-24 DIAGNOSIS — Z789 Other specified health status: Secondary | ICD-10-CM | POA: Diagnosis not present

## 2021-09-24 MED ORDER — PRALUENT 150 MG/ML ~~LOC~~ SOAJ
150.0000 mg | SUBCUTANEOUS | 5 refills | Status: DC
  Filled 2021-09-24: qty 2, 28d supply, fill #0

## 2021-10-01 ENCOUNTER — Encounter: Payer: Self-pay | Admitting: *Deleted

## 2021-10-02 ENCOUNTER — Other Ambulatory Visit (HOSPITAL_BASED_OUTPATIENT_CLINIC_OR_DEPARTMENT_OTHER): Payer: Self-pay

## 2021-10-02 MED ORDER — OZEMPIC (2 MG/DOSE) 8 MG/3ML ~~LOC~~ SOPN
2.0000 mg | PEN_INJECTOR | SUBCUTANEOUS | 5 refills | Status: AC
  Filled 2021-10-07: qty 3, 28d supply, fill #0
  Filled 2021-11-12: qty 3, 28d supply, fill #1
  Filled 2021-12-05: qty 3, 28d supply, fill #2

## 2021-10-03 ENCOUNTER — Other Ambulatory Visit (HOSPITAL_BASED_OUTPATIENT_CLINIC_OR_DEPARTMENT_OTHER): Payer: Self-pay

## 2021-10-07 ENCOUNTER — Other Ambulatory Visit (HOSPITAL_BASED_OUTPATIENT_CLINIC_OR_DEPARTMENT_OTHER): Payer: Self-pay

## 2021-10-11 DIAGNOSIS — E1140 Type 2 diabetes mellitus with diabetic neuropathy, unspecified: Secondary | ICD-10-CM | POA: Diagnosis not present

## 2021-10-11 DIAGNOSIS — E782 Mixed hyperlipidemia: Secondary | ICD-10-CM | POA: Diagnosis not present

## 2021-10-11 DIAGNOSIS — K219 Gastro-esophageal reflux disease without esophagitis: Secondary | ICD-10-CM | POA: Diagnosis not present

## 2021-10-11 DIAGNOSIS — I10 Essential (primary) hypertension: Secondary | ICD-10-CM | POA: Diagnosis not present

## 2021-10-17 DIAGNOSIS — Z789 Other specified health status: Secondary | ICD-10-CM | POA: Diagnosis not present

## 2021-10-17 DIAGNOSIS — E1140 Type 2 diabetes mellitus with diabetic neuropathy, unspecified: Secondary | ICD-10-CM | POA: Diagnosis not present

## 2021-10-17 DIAGNOSIS — Z6827 Body mass index (BMI) 27.0-27.9, adult: Secondary | ICD-10-CM | POA: Diagnosis not present

## 2021-10-21 DIAGNOSIS — Z8673 Personal history of transient ischemic attack (TIA), and cerebral infarction without residual deficits: Secondary | ICD-10-CM | POA: Diagnosis not present

## 2021-10-21 DIAGNOSIS — M791 Myalgia, unspecified site: Secondary | ICD-10-CM | POA: Diagnosis not present

## 2021-10-21 DIAGNOSIS — E7800 Pure hypercholesterolemia, unspecified: Secondary | ICD-10-CM | POA: Diagnosis not present

## 2021-10-21 DIAGNOSIS — T466X5A Adverse effect of antihyperlipidemic and antiarteriosclerotic drugs, initial encounter: Secondary | ICD-10-CM | POA: Diagnosis not present

## 2021-10-21 DIAGNOSIS — I639 Cerebral infarction, unspecified: Secondary | ICD-10-CM | POA: Diagnosis not present

## 2021-10-28 ENCOUNTER — Ambulatory Visit: Payer: Medicare Other | Admitting: Internal Medicine

## 2021-10-29 ENCOUNTER — Other Ambulatory Visit: Payer: Self-pay

## 2021-10-31 ENCOUNTER — Encounter: Payer: Self-pay | Admitting: Internal Medicine

## 2021-10-31 ENCOUNTER — Ambulatory Visit (INDEPENDENT_AMBULATORY_CARE_PROVIDER_SITE_OTHER): Payer: Medicare Other | Admitting: Internal Medicine

## 2021-10-31 ENCOUNTER — Telehealth: Payer: Self-pay | Admitting: Pharmacy Technician

## 2021-10-31 VITALS — BP 106/68 | HR 80 | Temp 97.5°F | Ht 70.0 in | Wt 186.6 lb

## 2021-10-31 DIAGNOSIS — K219 Gastro-esophageal reflux disease without esophagitis: Secondary | ICD-10-CM

## 2021-10-31 DIAGNOSIS — Z86010 Personal history of colonic polyps: Secondary | ICD-10-CM

## 2021-10-31 DIAGNOSIS — K921 Melena: Secondary | ICD-10-CM | POA: Diagnosis not present

## 2021-10-31 NOTE — Patient Instructions (Signed)
It was very nice seeing you today!  Congratulations on weight loss!  See Dr. Hilma Favors; he may well be able to get you off your blood pressure medication.  He will have to make that determination  Reflux is well controlled on lifestyle and weight loss.  You can back off on the Protonix and may be able to stop it entirely in the near future.    May take Imodium on a daily basis for occasional diarrhea  Plan for repeat colonoscopy in 2 years (2025) we will see you back in 2 years  Of course, if you have any interim problems please let me know.

## 2021-10-31 NOTE — Progress Notes (Signed)
Primary Care Physician:  Sharilyn Sites, MD Primary Gastroenterologist:  Dr.   Pre-Procedure History & Physical: HPI:  David Pruitt is a 76 y.o. male here for follow-up of diarrhea, GERD, history colonic polyps.  GI bleeding last year worked up with EGD/colonoscopy/VCE.  No cause found although it was suspected he may have had diverticular bleeding; he has done well - not really having any further bleeding aside from rare blood on toilet tissue. Intermittent chronic diarrhea punctuated by periods of normal bowel function.  Takes Imodium no more than 1 a day prophylactically from time to time.  He is lost 25 to 30 pounds on Ozempic.  Hemoglobin A1c from went from 9 to 6.  No reflux symptoms.  No dysphagia.  Backed off on his Protonix.  He continues on Plavix.  He is not anticoagulated. History of colonic polyps.  Past Medical History:  Diagnosis Date   CKD (chronic kidney disease), stage IIIa ([place]) 08/15/2020   CVA (cerebral infarction)    2006   DM (dermatomyositis)    Gastritis    GERD (gastroesophageal reflux disease)    Hemorrhoids    Hypercholesteremia    Hypertension    Lower GI bleed    SECONDARY TO DIVERTICULA,S/P BLEED   Overweight (BMI 25.0-29.9) 08/15/2020   Shortness of breath dyspnea    Type 2 diabetes mellitus ([place])     Past Surgical History:  Procedure Laterality Date   COLONOSCOPY  12/2009   RMR: Lower GI bleed due to a single diverticulum status post Endo clipping.  Scattered diverticulosis.   COLONOSCOPY N/A 05/24/2014   Procedure: COLONOSCOPY;  Surgeon: Rogene Houston, MD;  [facility];  Service: Endoscopy;  Laterality: N/A;  1030   COLONOSCOPY N/A 03/15/2019   Diverticulosis, 5 polyps removed.  Pathology revealed tubular adenomas.  Next colonoscopy in 3 years.   COLONOSCOPY WITH PROPOFOL N/A 08/16/2020   Procedure: COLONOSCOPY WITH PROPOFOL;  Surgeon: Rogene Houston, MD;  [facility];  Service: Endoscopy;  Laterality: N/A;    ESOPHAGOGASTRODUODENOSCOPY  12/2009   RMR: Mild esophagitis, gastritis, duodenitis.  Gastric biopsies negative for H. pylori   ESOPHAGOGASTRODUODENOSCOPY (EGD) WITH PROPOFOL N/A 08/15/2020   Procedure: ESOPHAGOGASTRODUODENOSCOPY (EGD) WITH PROPOFOL;  Surgeon: Daneil Dolin, MD;  [facility];  Service: Endoscopy;  Laterality: N/A;  covid + (asymptomatic)   GIVENS CAPSULE STUDY N/A 08/29/2020   Procedure: GIVENS CAPSULE STUDY;  Surgeon: Rogene Houston, MD;  [facility];  Service: Endoscopy;  Laterality: N/A;   POLYPECTOMY  03/15/2019   Procedure: POLYPECTOMY;  Surgeon: Daneil Dolin, MD;  [facility];  Service: Endoscopy;;    Prior to Admission medications   Medication Sig Start Date End Date Taking? Authorizing Provider  Alirocumab (PRALUENT) 150 MG/ML SOAJ Inject 150 mg into the skin 2 (two) times a week. Patient taking differently: Inject 150 mg into the skin every 14 (fourteen) days. 09/25/21  Yes   Apple Cider Vinegar 500 MG TABS as directed Orally   Yes [provider]  ascorbic acid (VITAMIN C) 1000 MG tablet Take 1,000 mg by mouth in the morning and at bedtime.   Yes [provider]  clopidogrel (PLAVIX) 75 MG tablet Take 1 tablet (75 mg total) by mouth daily. 08/19/20  Yes Barton Dubois, MD  Coenzyme Q10-Fish Oil-Vit E (CO-Q 10 OMEGA-3 FISH OIL) CAPS as directed Orally   Yes [provider]  gabapentin (NEURONTIN) 100 MG capsule Take by  mouth. 10/22/21  Yes [provider]  glipiZIDE (GLUCOTROL XL) 5 MG 24 hr tablet Take 5 mg by mouth daily.   Yes [provider]  glucosamine-chondroitin 500-400 MG tablet Take 1 tablet by mouth daily.   Yes [provider]  lisinopril (PRINIVIL,ZESTRIL) 10 MG tablet Take 10 mg by mouth daily.   Yes [provider]  loperamide (IMODIUM) 2 MG capsule Take by mouth as needed for diarrhea or loose stools.   Yes [provider]  loratadine (CLARITIN) 10  MG tablet Take 10 mg daily by mouth.   Yes [provider]  Magnesium 250 MG TABS Take 1 tablet by mouth daily.   Yes [provider]  metFORMIN (GLUCOPHAGE-XR) 500 MG 24 hr tablet Take 500 mg by mouth daily. 10/28/21  Yes [provider]  metoprolol succinate (TOPROL-XL) 50 MG 24 hr tablet Take 50 mg by mouth daily. 10/23/19  Yes [provider]  Multiple Vitamins-Minerals (MULTIVITAMIN WITH MINERALS) tablet Take 1 tablet by mouth daily.   Yes [provider]  pantoprazole (PROTONIX) 40 MG tablet Take 1 tablet (40 mg total) by mouth daily. 04/29/21  Yes Jeidi Gilles, Cristopher Estimable, MD  Probiotic, Lactobacillus, CAPS Take 1 capsule by mouth daily.   Yes [provider]  Semaglutide, 2 MG/DOSE, (OZEMPIC, 2 MG/DOSE,) 8 MG/3ML SOPN Inject 2 mg into the skin once a week. 10/02/21  Yes   Turmeric Curcumin 500 MG CAPS as directed Orally   Yes [provider]  vitamin B-12 (CYANOCOBALAMIN) 1000 MCG tablet Take 1,000 mcg daily by mouth.   Yes [provider]    Allergies as of 10/31/2021 - Review Complete 10/31/2021  Allergen Reaction Noted   Dapagliflozin  10/31/2021   Statins  10/31/2021    Family History  Problem Relation Age of Onset   Colon cancer Father    Stroke Father     Social History   Socioeconomic History   Marital status: Married    Spouse name: Not on file   Number of children: Not on file   Years of education: Not on file   Highest education level: Not on file  Occupational History   Not on file  Tobacco Use   Smoking status: Former    Packs/day: 1.00    Years: 40.00    Total pack years: 40.00    Types: Cigarettes   Smokeless tobacco: Never  Vaping Use   Vaping Use: Never used  Substance and Sexual Activity   Alcohol use: Yes    Comment: occasionally   Drug use: No   Sexual activity: Yes    Partners: Female    Comment: spouse  Other Topics Concern   Not on file  Social History Narrative   Not on  file   Social Determinants of Health   Financial Resource Strain: Not on file  Food Insecurity: Not on file  Transportation Needs: Not on file  Physical Activity: Not on file  Stress: Not on file  Social Connections: Not on file  Intimate Partner Violence: Not on file    Review of Systems: See HPI, otherwise negative ROS  Physical Exam: BP 106/68 (BP Location: Right Arm, Patient Position: Sitting, Cuff Size: Normal)   Pulse 80   Temp (!) 97.5 F (36.4 C) (Oral)   Ht '5\' 10"'$  (1.778 m)   Wt 186 lb 9.6 oz (84.6 kg)   BMI 26.77 kg/m  General:   Alert,  Well-developed, well-nourished, pleasant and cooperative in NAD; accompanied by  spouse. Neck:  Supple; no masses or thyromegaly. No significant cervical adenopathy. Lungs:  Clear throughout to auscultation.   No wheezes, crackles, or rhonchi. No acute distress. Heart:  Regular rate and rhythm; no murmurs, clicks, rubs,  or gallops. Abdomen: Non-distended, normal bowel sounds.  Soft and nontender without appreciable mass or hepatosplenomegaly.  Pulses:  Normal pulses noted. Extremities:  Without clubbing or edema.  Impression/Plan: Very pleasant 76 year old gentleman with a history of GERD symptoms now well controlled with essentially lifestyle/diet.  He was commended on significant weight loss and improvement in his hemoglobin A1c.  He probably does not need to take a PPI anymore.  History of GI bleed 2022.  Thorough GI evaluation.  Suspect diverticular origin.  History of colonic polyps; due for surveillance colonoscopy 2025.  Recommendations:  Reflux is well controlled on lifestyle and weight loss.  Back off on the Protonix and may be able to stop it entirely in the near future.    May take Imodium on a daily basis for occasional diarrhea  Plan for repeat colonoscopy in 2 years (2025) we will see you back in 2 years  Of course, if you have any interim problems please let me know.   Notice: This dictation was prepared with  Dragon dictation along with smaller phrase technology. Any transcriptional errors that result from this process are unintentional and may not be corrected upon review.

## 2021-10-31 NOTE — Telephone Encounter (Signed)
Auth Submission: NO AUTH NEEDED Payer: MEDICARE A/B [employer] [place] Medication & CPT/J Code(s) submitted: Leqvio (Inclisiran) [phone] Route of submission (phone, fax, portal):  Phone # Fax # Auth type: Buy/Bill Units/visits requested: X2 Reference number: [id number] BIV Approval from: 10/31/21 to 01/11/22

## 2021-11-12 ENCOUNTER — Other Ambulatory Visit (HOSPITAL_BASED_OUTPATIENT_CLINIC_OR_DEPARTMENT_OTHER): Payer: Self-pay

## 2021-11-17 DIAGNOSIS — Z23 Encounter for immunization: Secondary | ICD-10-CM | POA: Diagnosis not present

## 2021-11-20 DIAGNOSIS — Z23 Encounter for immunization: Secondary | ICD-10-CM | POA: Diagnosis not present

## 2021-11-24 ENCOUNTER — Ambulatory Visit (INDEPENDENT_AMBULATORY_CARE_PROVIDER_SITE_OTHER): Payer: Medicare Other

## 2021-11-24 VITALS — BP 114/76 | HR 90 | Temp 97.5°F | Resp 18 | Ht 70.0 in | Wt 184.0 lb

## 2021-11-24 DIAGNOSIS — E7800 Pure hypercholesterolemia, unspecified: Secondary | ICD-10-CM | POA: Diagnosis not present

## 2021-11-24 MED ORDER — INCLISIRAN SODIUM 284 MG/1.5ML ~~LOC~~ SOSY
284.0000 mg | PREFILLED_SYRINGE | Freq: Once | SUBCUTANEOUS | Status: AC
  Administered 2021-11-24: 284 mg via SUBCUTANEOUS
  Filled 2021-11-24: qty 1.5

## 2021-11-24 NOTE — Progress Notes (Signed)
Diagnosis: Hyperlipidemia  Provider:  Marshell Garfinkel MD  Procedure: Injection  Leqvio (inclisiran), Dose: 284 mg, Site: subcutaneous, Number of injections: 1  Post Care: Observation period completed  Discharge: Condition: Good, Destination: Home . AVS provided to patient.   Performed by:  Arnoldo Morale, RN

## 2021-11-24 NOTE — Patient Instructions (Signed)
Inclisiran Injection What is this medication? INCLISIRAN (in kli SIR an) treats high cholesterol. It works by decreasing bad cholesterol (such as LDL) in your blood. Changes to diet and exercise are often combined with this medication. This medicine may be used for other purposes; ask your health care provider or pharmacist if you have questions. COMMON BRAND NAME(S): LEQVIO What should I tell my care team before I take this medication? They need to know if you have any of these conditions: An unusual or allergic reaction to inclisiran, other medications, foods, dyes, or preservatives Pregnant or trying to get pregnant Breast-feeding How should I use this medication? This medication is injected under the skin. It is given by your care team in a hospital or clinic setting. Talk to your care team about the use of this medication in children. Special care may be needed. Overdosage: If you think you have taken too much of this medicine contact a poison control center or emergency room at once. NOTE: This medicine is only for you. Do not share this medicine with others. What if I miss a dose? Keep appointments for follow-up doses. It is important not to miss your dose. Call your care team if you are unable to keep an appointment. What may interact with this medication? Interactions are not expected. This list may not describe all possible interactions. Give your health care provider a list of all the medicines, herbs, non-prescription drugs, or dietary supplements you use. Also tell them if you smoke, drink alcohol, or use illegal drugs. Some items may interact with your medicine. What should I watch for while using this medication? Visit your care team for regular checks on your progress. Tell your care team if your symptoms do not start to get better or if they get worse. You may need blood work while you are taking this medication. What side effects may I notice from receiving this  medication? Side effects that you should report to your care team as soon as possible: Allergic reactions--skin rash, itching, hives, swelling of the face, lips, tongue, or throat Side effects that usually do not require medical attention (report these to your care team if they continue or are bothersome): Joint pain Pain, redness, or irritation at injection site This list may not describe all possible side effects. Call your doctor for medical advice about side effects. You may report side effects to FDA at 1-800-FDA-1088. Where should I keep my medication? This medication is given in a hospital or clinic. It will not be stored at home. NOTE: This sheet is a summary. It may not cover all possible information. If you have questions about this medicine, talk to your doctor, pharmacist, or health care provider.  2023 Elsevier/Gold Standard (2020-01-17 00:00:00)  

## 2021-12-05 ENCOUNTER — Other Ambulatory Visit (HOSPITAL_BASED_OUTPATIENT_CLINIC_OR_DEPARTMENT_OTHER): Payer: Self-pay

## 2021-12-16 DIAGNOSIS — L570 Actinic keratosis: Secondary | ICD-10-CM | POA: Diagnosis not present

## 2021-12-16 DIAGNOSIS — Z85828 Personal history of other malignant neoplasm of skin: Secondary | ICD-10-CM | POA: Diagnosis not present

## 2021-12-16 DIAGNOSIS — D239 Other benign neoplasm of skin, unspecified: Secondary | ICD-10-CM | POA: Diagnosis not present

## 2021-12-16 DIAGNOSIS — Z1283 Encounter for screening for malignant neoplasm of skin: Secondary | ICD-10-CM | POA: Diagnosis not present

## 2021-12-24 DIAGNOSIS — E7800 Pure hypercholesterolemia, unspecified: Secondary | ICD-10-CM | POA: Diagnosis not present

## 2022-01-12 ENCOUNTER — Emergency Department (HOSPITAL_COMMUNITY): Payer: Medicare Other

## 2022-01-12 ENCOUNTER — Inpatient Hospital Stay (HOSPITAL_COMMUNITY)
Admission: EM | Admit: 2022-01-12 | Discharge: 2022-01-16 | DRG: 871 | Disposition: A | Discharge status: Home Health | Payer: Medicare Other | Attending: Family Medicine | Admitting: Family Medicine

## 2022-01-12 ENCOUNTER — Encounter: Payer: Self-pay | Admitting: Pulmonary Disease

## 2022-01-12 ENCOUNTER — Encounter (HOSPITAL_COMMUNITY): Payer: Self-pay | Admitting: Emergency Medicine

## 2022-01-12 ENCOUNTER — Inpatient Hospital Stay (HOSPITAL_COMMUNITY): Payer: Medicare Other

## 2022-01-12 ENCOUNTER — Other Ambulatory Visit: Payer: Self-pay

## 2022-01-12 DIAGNOSIS — Z823 Family history of stroke: Secondary | ICD-10-CM

## 2022-01-12 DIAGNOSIS — N170 Acute kidney failure with tubular necrosis: Secondary | ICD-10-CM | POA: Diagnosis not present

## 2022-01-12 DIAGNOSIS — Z888 Allergy status to other drugs, medicaments and biological substances status: Secondary | ICD-10-CM | POA: Diagnosis not present

## 2022-01-12 DIAGNOSIS — R6521 Severe sepsis with septic shock: Secondary | ICD-10-CM | POA: Diagnosis not present

## 2022-01-12 DIAGNOSIS — I129 Hypertensive chronic kidney disease with stage 1 through stage 4 chronic kidney disease, or unspecified chronic kidney disease: Secondary | ICD-10-CM | POA: Diagnosis not present

## 2022-01-12 DIAGNOSIS — Z79899 Other long term (current) drug therapy: Secondary | ICD-10-CM

## 2022-01-12 DIAGNOSIS — E1122 Type 2 diabetes mellitus with diabetic chronic kidney disease: Secondary | ICD-10-CM | POA: Diagnosis present

## 2022-01-12 DIAGNOSIS — R7401 Elevation of levels of liver transaminase levels: Secondary | ICD-10-CM | POA: Diagnosis present

## 2022-01-12 DIAGNOSIS — Z1152 Encounter for screening for COVID-19: Secondary | ICD-10-CM | POA: Diagnosis not present

## 2022-01-12 DIAGNOSIS — N1831 Chronic kidney disease, stage 3a: Secondary | ICD-10-CM | POA: Diagnosis present

## 2022-01-12 DIAGNOSIS — Z87891 Personal history of nicotine dependence: Secondary | ICD-10-CM

## 2022-01-12 DIAGNOSIS — J181 Lobar pneumonia, unspecified organism: Secondary | ICD-10-CM | POA: Diagnosis present

## 2022-01-12 DIAGNOSIS — E7800 Pure hypercholesterolemia, unspecified: Secondary | ICD-10-CM | POA: Diagnosis present

## 2022-01-12 DIAGNOSIS — E872 Acidosis, unspecified: Secondary | ICD-10-CM | POA: Diagnosis not present

## 2022-01-12 DIAGNOSIS — N2889 Other specified disorders of kidney and ureter: Secondary | ICD-10-CM | POA: Diagnosis not present

## 2022-01-12 DIAGNOSIS — A419 Sepsis, unspecified organism: Secondary | ICD-10-CM | POA: Diagnosis not present

## 2022-01-12 DIAGNOSIS — Z8719 Personal history of other diseases of the digestive system: Secondary | ICD-10-CM | POA: Diagnosis not present

## 2022-01-12 DIAGNOSIS — E869 Volume depletion, unspecified: Secondary | ICD-10-CM | POA: Diagnosis present

## 2022-01-12 DIAGNOSIS — K219 Gastro-esophageal reflux disease without esophagitis: Secondary | ICD-10-CM | POA: Diagnosis present

## 2022-01-12 DIAGNOSIS — Z7902 Long term (current) use of antithrombotics/antiplatelets: Secondary | ICD-10-CM | POA: Diagnosis not present

## 2022-01-12 DIAGNOSIS — R945 Abnormal results of liver function studies: Secondary | ICD-10-CM | POA: Diagnosis not present

## 2022-01-12 DIAGNOSIS — Z8673 Personal history of transient ischemic attack (TIA), and cerebral infarction without residual deficits: Secondary | ICD-10-CM

## 2022-01-12 DIAGNOSIS — N179 Acute kidney failure, unspecified: Secondary | ICD-10-CM | POA: Diagnosis present

## 2022-01-12 DIAGNOSIS — Z7984 Long term (current) use of oral hypoglycemic drugs: Secondary | ICD-10-CM

## 2022-01-12 DIAGNOSIS — J439 Emphysema, unspecified: Secondary | ICD-10-CM | POA: Diagnosis not present

## 2022-01-12 DIAGNOSIS — J189 Pneumonia, unspecified organism: Secondary | ICD-10-CM

## 2022-01-12 DIAGNOSIS — J9601 Acute respiratory failure with hypoxia: Secondary | ICD-10-CM | POA: Diagnosis not present

## 2022-01-12 DIAGNOSIS — R000 Tachycardia, unspecified: Secondary | ICD-10-CM | POA: Diagnosis not present

## 2022-01-12 DIAGNOSIS — R0602 Shortness of breath: Secondary | ICD-10-CM | POA: Diagnosis not present

## 2022-01-12 DIAGNOSIS — N281 Cyst of kidney, acquired: Secondary | ICD-10-CM | POA: Diagnosis not present

## 2022-01-12 DIAGNOSIS — R918 Other nonspecific abnormal finding of lung field: Secondary | ICD-10-CM | POA: Diagnosis not present

## 2022-01-12 DIAGNOSIS — Z7985 Long-term (current) use of injectable non-insulin antidiabetic drugs: Secondary | ICD-10-CM

## 2022-01-12 LAB — COMPREHENSIVE METABOLIC PANEL
ALT: 38 U/L (ref 0–44)
AST: 44 U/L — ABNORMAL HIGH (ref 15–41)
Albumin: 3.8 g/dL (ref 3.5–5.0)
Alkaline Phosphatase: 59 U/L (ref 38–126)
Anion gap: 15 (ref 5–15)
BUN: 34 mg/dL — ABNORMAL HIGH (ref 8–23)
CO2: 19 mmol/L — ABNORMAL LOW (ref 22–32)
Calcium: 9.2 mg/dL (ref 8.9–10.3)
Chloride: 103 mmol/L (ref 98–111)
Creatinine, Ser: 2.29 mg/dL — ABNORMAL HIGH (ref 0.61–1.24)
GFR, Estimated: 29 mL/min — ABNORMAL LOW (ref >60)
Glucose, Bld: 259 mg/dL — ABNORMAL HIGH (ref 70–99)
Potassium: 4.2 mmol/L (ref 3.5–5.1)
Sodium: 137 mmol/L (ref 135–145)
Total Bilirubin: 1.9 mg/dL — ABNORMAL HIGH (ref 0.3–1.2)
Total Protein: 6.8 g/dL (ref 6.5–8.1)

## 2022-01-12 LAB — CBC WITH DIFFERENTIAL/PLATELET
Abs Immature Granulocytes: 0.1 10*3/uL — ABNORMAL HIGH (ref 0.00–0.07)
Band Neutrophils: 14 %
Basophils Absolute: 0 10*3/uL (ref 0.0–0.1)
Basophils Relative: 0 %
Eosinophils Absolute: 0 10*3/uL (ref 0.0–0.5)
Eosinophils Relative: 0 %
HCT: 44 % (ref 39.0–52.0)
Hemoglobin: 14.5 g/dL (ref 13.0–17.0)
Lymphocytes Relative: 5 %
Lymphs Abs: 0.5 10*3/uL — ABNORMAL LOW (ref 0.7–4.0)
MCH: 32.2 pg (ref 26.0–34.0)
MCHC: 33 g/dL (ref 30.0–36.0)
MCV: 97.6 fL (ref 80.0–100.0)
Metamyelocytes Relative: 1 %
Monocytes Absolute: 0.6 10*3/uL (ref 0.1–1.0)
Monocytes Relative: 6 %
Neutro Abs: 8.7 10*3/uL — ABNORMAL HIGH (ref 1.7–7.7)
Neutrophils Relative %: 74 %
Platelets: 227 10*3/uL (ref 150–400)
RBC: 4.51 MIL/uL (ref 4.22–5.81)
RDW: 13.2 % (ref 11.5–15.5)
WBC: 9.9 10*3/uL (ref 4.0–10.5)
nRBC: 0 % (ref 0.0–0.2)

## 2022-01-12 LAB — PROCALCITONIN: Procalcitonin: 46.08 ng/mL

## 2022-01-12 LAB — PROTIME-INR
INR: 1.1 (ref 0.8–1.2)
Prothrombin Time: 14.3 seconds (ref 11.4–15.2)

## 2022-01-12 LAB — RESP PANEL BY RT-PCR (RSV, FLU A&B, COVID)  RVPGX2
Influenza A by PCR: NEGATIVE
Influenza B by PCR: NEGATIVE
Resp Syncytial Virus by PCR: NEGATIVE
SARS Coronavirus 2 by RT PCR: NEGATIVE

## 2022-01-12 LAB — URINALYSIS, COMPLETE (UACMP) WITH MICROSCOPIC
Bilirubin Urine: NEGATIVE
Glucose, UA: NEGATIVE mg/dL
Hgb urine dipstick: NEGATIVE
Ketones, ur: NEGATIVE mg/dL
Leukocytes,Ua: NEGATIVE
Nitrite: NEGATIVE
Protein, ur: 30 mg/dL — AB
Specific Gravity, Urine: 1.021 (ref 1.005–1.030)
pH: 5 (ref 5.0–8.0)

## 2022-01-12 LAB — LACTIC ACID, PLASMA
Lactic Acid, Venous: 5.6 mmol/L (ref 0.5–1.9)
Lactic Acid, Venous: 6.1 mmol/L (ref 0.5–1.9)
Lactic Acid, Venous: 5.5 mmol/L (ref 0.5–1.9)

## 2022-01-12 LAB — MRSA NEXT GEN BY PCR, NASAL: MRSA by PCR Next Gen: NOT DETECTED

## 2022-01-12 LAB — GLUCOSE, CAPILLARY
Glucose-Capillary: 152 mg/dL — ABNORMAL HIGH (ref 70–99)
Glucose-Capillary: 159 mg/dL — ABNORMAL HIGH (ref 70–99)

## 2022-01-12 LAB — LIPASE, BLOOD: Lipase: 25 U/L (ref 11–51)

## 2022-01-12 MED ORDER — SODIUM CHLORIDE 0.9 % IV SOLN
2.0000 g | Freq: Two times a day (BID) | INTRAVENOUS | Status: DC
  Administered 2022-01-12 – 2022-01-14 (×4): 2 g via INTRAVENOUS
  Filled 2022-01-12 (×4): qty 12.5

## 2022-01-12 MED ORDER — VITAMIN B-12 1000 MCG PO TABS
1000.0000 ug | ORAL_TABLET | Freq: Every day | ORAL | Status: DC
  Administered 2022-01-12 – 2022-01-16 (×5): 1000 ug via ORAL
  Filled 2022-01-12 (×5): qty 1

## 2022-01-12 MED ORDER — ONDANSETRON HCL 4 MG/2ML IJ SOLN: 4.0000 mg | Freq: Four times a day (QID) | INTRAMUSCULAR | Status: DC | PRN

## 2022-01-12 MED ORDER — INSULIN ASPART 100 UNIT/ML IJ SOLN
0.0000 [IU] | Freq: Three times a day (TID) | INTRAMUSCULAR | Status: DC
  Administered 2022-01-12: 2 [IU] via SUBCUTANEOUS
  Administered 2022-01-13: 1 [IU] via SUBCUTANEOUS
  Administered 2022-01-13: 2 [IU] via SUBCUTANEOUS
  Administered 2022-01-13: 1 [IU] via SUBCUTANEOUS
  Administered 2022-01-14 (×2): 2 [IU] via SUBCUTANEOUS
  Administered 2022-01-15: 3 [IU] via SUBCUTANEOUS
  Administered 2022-01-15: 1 [IU] via SUBCUTANEOUS
  Administered 2022-01-15 – 2022-01-16 (×2): 2 [IU] via SUBCUTANEOUS
  Administered 2022-01-16: 3 [IU] via SUBCUTANEOUS

## 2022-01-12 MED ORDER — ACETAMINOPHEN 500 MG PO TABS
1000.0000 mg | ORAL_TABLET | Freq: Once | ORAL | Status: AC
  Administered 2022-01-12: 1000 mg via ORAL
  Filled 2022-01-12: qty 2

## 2022-01-12 MED ORDER — PANTOPRAZOLE SODIUM 40 MG PO TBEC
40.0000 mg | DELAYED_RELEASE_TABLET | Freq: Every day | ORAL | Status: DC
  Administered 2022-01-12 – 2022-01-16 (×5): 40 mg via ORAL
  Filled 2022-01-12 (×5): qty 1

## 2022-01-12 MED ORDER — VANCOMYCIN HCL 750 MG/150ML IV SOLN
750.0000 mg | INTRAVENOUS | Status: DC
  Administered 2022-01-13: 750 mg via INTRAVENOUS
  Filled 2022-01-12 (×2): qty 150

## 2022-01-12 MED ORDER — NOREPINEPHRINE 4 MG/250ML-% IV SOLN
2.0000 ug/min | INTRAVENOUS | Status: DC
  Administered 2022-01-12: 2 ug/min via INTRAVENOUS
  Administered 2022-01-13: 7 ug/min via INTRAVENOUS
  Administered 2022-01-13: 5 ug/min via INTRAVENOUS
  Filled 2022-01-12 (×3): qty 250

## 2022-01-12 MED ORDER — SODIUM CHLORIDE 0.9 % IV SOLN
2.0000 g | Freq: Once | INTRAVENOUS | Status: AC
  Administered 2022-01-12: 2 g via INTRAVENOUS
  Filled 2022-01-12: qty 20

## 2022-01-12 MED ORDER — ALBUTEROL SULFATE (2.5 MG/3ML) 0.083% IN NEBU: 2.5000 mg | INHALATION_SOLUTION | RESPIRATORY_TRACT | Status: DC | PRN

## 2022-01-12 MED ORDER — CLOPIDOGREL BISULFATE 75 MG PO TABS
75.0000 mg | ORAL_TABLET | Freq: Every day | ORAL | Status: DC
  Administered 2022-01-12 – 2022-01-15 (×4): 75 mg via ORAL
  Filled 2022-01-12 (×4): qty 1

## 2022-01-12 MED ORDER — ONDANSETRON HCL 4 MG PO TABS: 4.0000 mg | ORAL_TABLET | Freq: Four times a day (QID) | ORAL | Status: DC | PRN

## 2022-01-12 MED ORDER — CHLORHEXIDINE GLUCONATE CLOTH 2 % EX PADS
6.0000 | MEDICATED_PAD | Freq: Every day | CUTANEOUS | Status: DC
  Administered 2022-01-12 – 2022-01-16 (×5): 6 via TOPICAL

## 2022-01-12 MED ORDER — ALBUTEROL SULFATE HFA 108 (90 BASE) MCG/ACT IN AERS
2.0000 | INHALATION_SPRAY | RESPIRATORY_TRACT | Status: DC | PRN
  Filled 2022-01-12: qty 6.7

## 2022-01-12 MED ORDER — GABAPENTIN 100 MG PO CAPS
100.0000 mg | ORAL_CAPSULE | Freq: Every day | ORAL | Status: DC
  Administered 2022-01-12 – 2022-01-15 (×4): 100 mg via ORAL
  Filled 2022-01-12 (×4): qty 1

## 2022-01-12 MED ORDER — ACETAMINOPHEN 325 MG PO TABS
650.0000 mg | ORAL_TABLET | Freq: Four times a day (QID) | ORAL | Status: DC | PRN
  Administered 2022-01-14 – 2022-01-16 (×3): 650 mg via ORAL
  Filled 2022-01-12 (×3): qty 2

## 2022-01-12 MED ORDER — SODIUM CHLORIDE 0.9 % IV SOLN
500.0000 mg | INTRAVENOUS | Status: DC
  Administered 2022-01-12 – 2022-01-15 (×4): 500 mg via INTRAVENOUS
  Filled 2022-01-12 (×4): qty 5

## 2022-01-12 MED ORDER — ORAL CARE MOUTH RINSE: 15.0000 mL | OROMUCOSAL | Status: DC | PRN

## 2022-01-12 MED ORDER — ACETAMINOPHEN 650 MG RE SUPP: 650.0000 mg | Freq: Four times a day (QID) | RECTAL | Status: DC | PRN

## 2022-01-12 MED ORDER — SODIUM CHLORIDE 0.9 % IV SOLN: 250.0000 mL | INTRAVENOUS | Status: DC

## 2022-01-12 MED ORDER — LACTATED RINGERS IV BOLUS
30.0000 mL/kg | Freq: Once | INTRAVENOUS | Status: AC
  Administered 2022-01-12: 2505 mL via INTRAVENOUS

## 2022-01-12 MED ORDER — VANCOMYCIN HCL 1500 MG/300ML IV SOLN
1500.0000 mg | Freq: Once | INTRAVENOUS | Status: AC
  Administered 2022-01-12: 1500 mg via INTRAVENOUS
  Filled 2022-01-12: qty 300

## 2022-01-12 MED ORDER — SODIUM CHLORIDE 0.9 % IV SOLN: INTRAVENOUS | Status: DC

## 2022-01-12 MED ORDER — HEPARIN SODIUM (PORCINE) 5000 UNIT/ML IJ SOLN
5000.0000 [IU] | Freq: Three times a day (TID) | INTRAMUSCULAR | Status: DC
  Administered 2022-01-12 – 2022-01-16 (×11): 5000 [IU] via SUBCUTANEOUS
  Filled 2022-01-12 (×11): qty 1

## 2022-01-12 MED ORDER — LORATADINE 10 MG PO TABS
10.0000 mg | ORAL_TABLET | Freq: Every day | ORAL | Status: DC
  Administered 2022-01-12 – 2022-01-15 (×4): 10 mg via ORAL
  Filled 2022-01-12 (×4): qty 1

## 2022-01-12 NOTE — H&P (Signed)
History and Physical    Patient: David Pruitt LKG:[id number] DOB: [birth year] DOA: 01/12/2022 DOS: the patient was seen and examined on 01/12/2022 PCP: Sharilyn Sites, MD  Patient coming from: Home  Chief Complaint:  Chief Complaint  Patient presents with   Emesis   Diarrhea   Weakness   Shortness of Breath   HPI: David Pruitt is a 77 year old male with a history of CKD, diabetes mellitus type 2, hypertension, hyperlipidemia, stroke presenting with 1 to 2-day history of generalized weakness, chest discomfort, shortness of breath, and coughing yellow sputum.  He started feeling bad on 01/10/2022, but symptoms significantly worsened on 01/11/2022 when he began developing nausea, vomiting, and diarrhea.  The patient has chronic loose stools for which he takes Imodium.  He states that he takes Imodium 2-3 times per week, last dose on 01/10/2022.  He had some fevers and chills and cold sweats on the evening of 01/11/2022.  There is no hematemesis, hemoptysis, hematochezia, melena.  He denies any dysuria or hematuria.  He states that his chest discomfort is primarily right-sided and pleuritic in nature.  On 01/12/2022, he had worsening generalized weakness to the point where he had difficulty getting into the car to come to the emergency department. In the ED, the patient was afebrile and hypotensive with blood pressure 79/38.  Oxygen saturation was 99% on 2 L.  WBC 9.9, hemoglobin 14.5, platelets 227,000.  Sodium 137, potassium 4.2, bicarbonate 19, serum creatinine 2.29.  LFTs showed AST 44, ALT 38, alk phosphatase 59, total bilirubin 1.9. Chest x-ray showed chronic right greater than left lower lobe scarring.  There was associated curvilinear opacities and interstitial thickening in the left lower lobe.  The patient was started on vancomycin, ceftriaxone, and azithromycin.  He was admitted for further evaluation and treatment of his sepsis.  Review of Systems: As mentioned in the history of  present illness. All other systems reviewed and are negative. Past Medical History:  Diagnosis Date   CKD (chronic kidney disease), stage IIIa ([place]) 08/15/2020   CVA (cerebral infarction)    2006   DM (dermatomyositis)    Gastritis    GERD (gastroesophageal reflux disease)    Hemorrhoids    Hypercholesteremia    Hypertension    Lower GI bleed    SECONDARY TO DIVERTICULA,S/P BLEED   Overweight (BMI 25.0-29.9) 08/15/2020   Shortness of breath dyspnea    Type 2 diabetes mellitus ([place])    Past Surgical History:  Procedure Laterality Date   COLONOSCOPY  12/2009   RMR: Lower GI bleed due to a single diverticulum status post Endo clipping.  Scattered diverticulosis.   COLONOSCOPY N/A 05/24/2014   Procedure: COLONOSCOPY;  Surgeon: Rogene Houston, MD;  [facility];  Service: Endoscopy;  Laterality: N/A;  1030   COLONOSCOPY N/A 03/15/2019   Diverticulosis, 5 polyps removed.  Pathology revealed tubular adenomas.  Next colonoscopy in 3 years.   COLONOSCOPY WITH PROPOFOL N/A 08/16/2020   Procedure: COLONOSCOPY WITH PROPOFOL;  Surgeon: Rogene Houston, MD;  [facility];  Service: Endoscopy;  Laterality: N/A;   ESOPHAGOGASTRODUODENOSCOPY  12/2009   RMR: Mild esophagitis, gastritis, duodenitis.  Gastric biopsies negative for H. pylori   ESOPHAGOGASTRODUODENOSCOPY (EGD) WITH PROPOFOL N/A 08/15/2020   Procedure: ESOPHAGOGASTRODUODENOSCOPY (EGD) WITH PROPOFOL;  Surgeon: Daneil Dolin, MD;  [facility];  Service: Endoscopy;  Laterality: N/A;  covid + (asymptomatic)   GIVENS CAPSULE STUDY N/A 08/29/2020   Procedure: GIVENS CAPSULE STUDY;  Surgeon:  Rogene Houston, MD;  [facility];  Service: Endoscopy;  Laterality: N/A;   POLYPECTOMY  03/15/2019   Procedure: POLYPECTOMY;  Surgeon: Daneil Dolin, MD;  [facility];  Service: Endoscopy;;   Social History:  reports that he has quit smoking. His smoking use included cigarettes. He has a 40.00 pack-year  smoking history. He has never used smokeless tobacco. He reports current alcohol use. He reports that he does not use drugs.  Allergies  Allergen Reactions   Dapagliflozin     Other reaction(s): Yeast infection   Statins     Other reaction(s): Unknown    Family History  Problem Relation Age of Onset   Colon cancer Father    Stroke Father     Prior to Admission medications   Medication Sig Start Date End Date Taking? Authorizing Provider  Alirocumab (PRALUENT) 150 MG/ML SOAJ Inject 150 mg into the skin 2 (two) times a week. Patient taking differently: Inject 150 mg into the skin every 14 (fourteen) days. 09/25/21     Apple Cider Vinegar 500 MG TABS as directed Orally    [provider]  ascorbic acid (VITAMIN C) 1000 MG tablet Take 1,000 mg by mouth in the morning and at bedtime.    [provider]  clopidogrel (PLAVIX) 75 MG tablet Take 1 tablet (75 mg total) by mouth daily. 08/19/20   Barton Dubois, MD  Coenzyme Q10-Fish Oil-Vit E (CO-Q 10 OMEGA-3 FISH OIL) CAPS as directed Orally    [provider]  gabapentin (NEURONTIN) 100 MG capsule Take by mouth. 10/22/21   [provider]  glipiZIDE (GLUCOTROL XL) 5 MG 24 hr tablet Take 5 mg by mouth daily.    [provider]  glucosamine-chondroitin 500-400 MG tablet Take 1 tablet by mouth daily.    [provider]  lisinopril (PRINIVIL,ZESTRIL) 10 MG tablet Take 10 mg by mouth daily.    [provider]  loperamide (IMODIUM) 2 MG capsule Take by mouth as needed for diarrhea or loose stools.    [provider]  loratadine (CLARITIN) 10 MG tablet Take 10 mg daily by mouth.    [provider]  Magnesium 250 MG TABS Take 1 tablet by mouth daily.    [provider]  metFORMIN (GLUCOPHAGE-XR) 500 MG 24 hr tablet Take 500 mg by mouth daily. 10/28/21   [provider]  metoprolol succinate (TOPROL-XL) 50 MG 24 hr tablet Take 50 mg by mouth daily. 10/23/19    [provider]  Multiple Vitamins-Minerals (MULTIVITAMIN WITH MINERALS) tablet Take 1 tablet by mouth daily.    [provider]  pantoprazole (PROTONIX) 40 MG tablet Take 1 tablet (40 mg total) by mouth daily. 04/29/21   Rourk, Cristopher Estimable, MD  Probiotic, Lactobacillus, CAPS Take 1 capsule by mouth daily.    [provider]  Semaglutide, 2 MG/DOSE, (OZEMPIC, 2 MG/DOSE,) 8 MG/3ML SOPN Inject 2 mg into the skin once a week. 10/02/21     Turmeric Curcumin 500 MG CAPS as directed Orally    [provider]  vitamin B-12 (CYANOCOBALAMIN) 1000 MCG tablet Take 1,000 mcg daily by mouth.    [provider]    Physical Exam: Vitals:   01/12/22 1414 01/12/22 1440 01/12/22 1503 01/12/22 1505  BP:  (!) 80/60 108/63   Pulse:  100 (!) 105   Resp:  16 16   Temp: 97.8 F (36.6 C)   97.6 F (36.4 C)  TempSrc: Oral  Oral  SpO2:  100% 100%   Weight:       GENERAL:  A&O x 3, NAD, well developed, cooperative, follows commands HEENT: [place]/AT, No thrush, No icterus, No oral ulcers Neck:  No neck mass, No meningismus, soft, supple CV: RRR, no S3, no S4, no rub, no JVD Lungs: Bibasilar rales.  No wheezing.  Good air movement.  Diminished breath sounds at the bases. Abd: soft/NT +BS, nondistended Ext: No edema, no lymphangitis, no cyanosis, no rashes Neuro:  CN II-XII intact, strength 4/5 in RUE, RLE, strength 4/5 LUE, LLE; sensation intact bilateral; no dysmetria; babinski equivocal  Data Reviewed: Data reviewed above in the history  Assessment and Plan: Severe sepsis -Presented with tachycardia, hypotension, lactic acid 6.1 -Secondary to pneumonia, but concerned about underlying bacteremia -Continue vancomycin, ceftriaxone, and azithromycin -Follow blood cultures -Obtain UA and urine culture -Check PCT -Continue IV fluids  Lobar pneumonia -Chest x-ray suggests left lower lobe interstitial thickening and curvilinear opacities -CT chest -Check PCT -MRSA  screen -Continue vancomycin, ceftriaxone, and azithromycin -Check COVID-19 PCR and influenza--negative  Acute on chronic renal failure--CKD 3a -Baseline creatinine 1.2-1.5 -Presented with serum creatinine 2.29 -Secondary to volume depletion and sepsis/hemodynamic changes  Transaminasemia -Right upper quadrant ultrasound -Likely secondary to sepsis and hypotension  Lactic acidosis -Continue IV fluids -Continue to trend  Essential hypertension -Holding metoprolol and lisinopril secondary to hypotension  Diabetes mellitus type 2 -Holding Ozempic, metformin and glipizide -NovoLog sliding scale -Check hemoglobin A1c  History of stroke -Continue Plavix     Advance Care Planning: FULL  Consults: none  Family Communication: spouse updated 01/12/22  Severity of Illness: The appropriate patient status for this patient is INPATIENT. Inpatient status is judged to be reasonable and necessary in order to provide the required intensity of service to ensure the patient's safety. The patient's presenting symptoms, physical exam findings, and initial radiographic and laboratory data in the context of their chronic comorbidities is felt to place them at high risk for further clinical deterioration. Furthermore, it is not anticipated that the patient will be medically stable for discharge from the hospital within 2 midnights of admission.   * I certify that at the point of admission it is my clinical judgment that the patient will require inpatient hospital care spanning beyond 2 midnights from the point of admission due to high intensity of service, high risk for further deterioration and high frequency of surveillance required.*  Author: Orson Eva, MD 01/12/2022 3:05 PM  For on call review [email or web address].

## 2022-01-12 NOTE — ED Notes (Addendum)
MD made aware of condition, Spo2 of 86% Ra and low BP of 70s/30s and RT at bedside . Fluids ordered for BP

## 2022-01-12 NOTE — ED Notes (Signed)
Dr Gwendolyn Fill aware of code sepsis

## 2022-01-12 NOTE — ED Notes (Signed)
Bladder scanner revealed 44 mL of urine in bladder

## 2022-01-12 NOTE — Progress Notes (Signed)
Pharmacy Antibiotic Note  David Pruitt is a 77 y.o. male admitted on 01/12/2022 with sepsis./bacteremia  Pharmacy has been consulted for Vancomycin and cefepime dosing. Emesis, Diarrhea, Weakness, and Shortness of Breath +> likely dehydrated.   Plan: Vancomycin '1500mg'$  IV loading dose then '750mg'$  IV Q 24 hrs. Goal AUC 400-550. Expected AUC: 435 SCr used: 2.29  Cefepime 2gm IV q12h, borderline and expect improvement with IVF F/u cxs and clinical progress Monitor V/S, labs and levels as indicated   Weight: 82.8 kg (182 lb 8.7 oz)  Temp (24hrs), Avg:97.8 F (36.6 C), Min:97.6 F (36.4 C), Max:98 F (36.7 C)  Recent Labs  Lab 01/12/22 1147 01/12/22 1150 01/12/22 1331  WBC  --  9.9  --   CREATININE 2.29*  --   --   LATICACIDVEN  --  5.6* 6.1*     Estimated Creatinine Clearance: 29.2 mL/min (A) (by C-G formula based on SCr of 2.29 mg/dL (H)).    Allergies  Allergen Reactions   Dapagliflozin     Other reaction(s): Yeast infection   Statins     Other reaction(s): Unknown    Antimicrobials this admission: Vancomycin 1/1 >>  Cefepime 1/1>> Azithromycin 1/1 >>  Ceftriaxone 1/1 in ED  Microbiology results: 1/1 BCx: pending 1/1 UCx: pending  1/1 Resp panel: influenza, RSV and COVID are negative  MRSA PCR:   Thank you for allowing pharmacy to be a part of this patient's care.  Isac Sarna, BS Pharm D, BCPS Clinical Pharmacist 01/12/2022 3:27 PM

## 2022-01-12 NOTE — Progress Notes (Signed)
Pharmacy Antibiotic Note  David Pruitt is a 77 y.o. male admitted on 01/12/2022 with sepsis./bacteremia  Pharmacy has been consulted for Vancomycin dosing. Emesis, Diarrhea, Weakness, and Shortness of Breath +> likely dehydrated.   Plan: Vancomycin '1500mg'$  IV loading dose then '750mg'$  IV Q 24 hrs. Goal AUC 400-550. Expected AUC: 435 SCr used: 2.29  F/u cxs and clinical progress Monitor V/S, labs and levels as indicated  Weight: 83.5 kg (184 lb)  Temp (24hrs), Avg:98 F (36.7 C), Min:98 F (36.7 C), Max:98 F (36.7 C)  Recent Labs  Lab 01/12/22 1147 01/12/22 1150  WBC  --  9.9  CREATININE 2.29*  --   LATICACIDVEN  --  5.6*    Estimated Creatinine Clearance: 29.2 mL/min (A) (by C-G formula based on SCr of 2.29 mg/dL (H)).    Allergies  Allergen Reactions   Dapagliflozin     Other reaction(s): Yeast infection   Statins     Other reaction(s): Unknown    Antimicrobials this admission: Vancomycin 1/1 >>  Azithromycin 1/1 >>  Ceftriaxone 1/1 in ED  Microbiology results: 1/1 BCx: pending 1/1 UCx: pending  1/1 Resp panel: influenza, RSV and COVID are negative  MRSA PCR:   Thank you for allowing pharmacy to be a part of this patient's care.  Isac Sarna, BS Pharm D, BCPS Clinical Pharmacist 01/12/2022 1:29 PM

## 2022-01-12 NOTE — ED Provider Notes (Signed)
[facility] Provider Note  CSN: [id number] Arrival date & time: 01/12/22 1116  Chief Complaint(s) Emesis, Diarrhea, Weakness, and Shortness of Breath  HPI David Pruitt is a 77 y.o. male with history of CKD, diabetes, hypertension, hyperlipidemia presenting to the emergency department with weakness.  Patient and wife report that he has had weakness since yesterday.  He is also been having nausea vomiting and diarrhea without abdominal pain since yesterday.  He developed a cough last night productive of yellow sputum.  This morning his weakness significantly worsened and he had difficulty getting into the car.  He denies any chest pain.  Reports mild shortness of breath.  He has had subjective fevers and chills at home.   Past Medical History Past Medical History:  Diagnosis Date   CKD (chronic kidney disease), stage IIIa ([place]) 08/15/2020   CVA (cerebral infarction)    2006   DM (dermatomyositis)    Gastritis    GERD (gastroesophageal reflux disease)    Hemorrhoids    Hypercholesteremia    Hypertension    Lower GI bleed    SECONDARY TO DIVERTICULA,S/P BLEED   Overweight (BMI 25.0-29.9) 08/15/2020   Shortness of breath dyspnea    Type 2 diabetes mellitus ([place])    Patient Active Problem List   Diagnosis Date Noted   Sepsis due to undetermined organism ([place]) 01/12/2022   Lobar pneumonia ([place]) 01/12/2022   Acute renal failure superimposed on stage 3a chronic kidney disease ([place]) 01/12/2022   Lactic acidosis 01/12/2022   Diarrhea 12/17/2020   Family history of prostate cancer 10/09/2020   History of UTI 10/09/2020   Orchitis and epididymitis 09/16/2020   Groin pain 09/16/2020   Testicular pain 09/16/2020   Nausea 09/16/2020   Hypotension 09/16/2020   Class 1 obesity due to excess calories with body mass index (BMI) of 30.0 to 30.9 in adult    CKD (chronic kidney disease), stage IIIa ([place]) 08/15/2020   Overweight (BMI 25.0-29.9) 08/15/2020   Hypertension     Hypercholesteremia    Type 2 diabetes mellitus with hyperglycemia (HCC)    Type 2 diabetes mellitus (HCC)    Gastrointestinal hemorrhage with melena 08/14/2020   Rectal bleeding 10/25/2019   Diverticulitis of colon 10/25/2019   History of GI diverticular bleed 12/07/2018   Lower GI bleed 12/07/2018   B12 deficiency 12/07/2018   Abnormal CT scan, sigmoid colon 12/07/2018   Acute blood loss anemia 12/07/2018   GERD (gastroesophageal reflux disease) 01/16/2010   GASTROINTESTINAL HEMORRHAGE 01/16/2010   Home Medication(s) Prior to Admission medications   Medication Sig Start Date End Date Taking? Authorizing Provider  Alirocumab (PRALUENT) 150 MG/ML SOAJ Inject 150 mg into the skin 2 (two) times a week. Patient taking differently: Inject 150 mg into the skin every 14 (fourteen) days. 09/25/21     Apple Cider Vinegar 500 MG TABS as directed Orally    [provider]  ascorbic acid (VITAMIN C) 1000 MG tablet Take 1,000 mg by mouth in the morning and at bedtime.    [provider]  clopidogrel (PLAVIX) 75 MG tablet Take 1 tablet (75 mg total) by mouth daily. 08/19/20   Barton Dubois, MD  Coenzyme Q10-Fish Oil-Vit E (CO-Q 10 OMEGA-3 FISH OIL) CAPS as directed Orally    [provider]  gabapentin (NEURONTIN) 100 MG capsule Take by mouth. 10/22/21   [provider]  glipiZIDE (GLUCOTROL XL) 5 MG 24 hr tablet Take 5 mg by mouth daily.    [provider]  glucosamine-chondroitin 500-400 MG tablet Take 1 tablet by mouth daily.    [provider]  lisinopril (PRINIVIL,ZESTRIL) 10 MG tablet Take 10 mg by mouth daily.    [provider]  loperamide (IMODIUM) 2 MG capsule Take by mouth as needed for diarrhea or loose stools.    [provider]  loratadine (CLARITIN) 10 MG tablet Take 10 mg daily by mouth.    [provider]  Magnesium 250 MG TABS Take 1 tablet by mouth daily.    [provider]  metFORMIN  (GLUCOPHAGE-XR) 500 MG 24 hr tablet Take 500 mg by mouth daily. 10/28/21   [provider]  metoprolol succinate (TOPROL-XL) 50 MG 24 hr tablet Take 50 mg by mouth daily. 10/23/19   [provider]  Multiple Vitamins-Minerals (MULTIVITAMIN WITH MINERALS) tablet Take 1 tablet by mouth daily.    [provider]  pantoprazole (PROTONIX) 40 MG tablet Take 1 tablet (40 mg total) by mouth daily. 04/29/21   Rourk, Cristopher Estimable, MD  Probiotic, Lactobacillus, CAPS Take 1 capsule by mouth daily.    [provider]  Semaglutide, 2 MG/DOSE, (OZEMPIC, 2 MG/DOSE,) 8 MG/3ML SOPN Inject 2 mg into the skin once a week. 10/02/21     Turmeric Curcumin 500 MG CAPS as directed Orally    [provider]  vitamin B-12 (CYANOCOBALAMIN) 1000 MCG tablet Take 1,000 mcg daily by mouth.    [provider]                                                                                                                                    Past Surgical History Past Surgical History:  Procedure Laterality Date   COLONOSCOPY  12/2009   RMR: Lower GI bleed due to a single diverticulum status post Endo clipping.  Scattered diverticulosis.   COLONOSCOPY N/A 05/24/2014   Procedure: COLONOSCOPY;  Surgeon: Rogene Houston, MD;  [facility];  Service: Endoscopy;  Laterality: N/A;  1030   COLONOSCOPY N/A 03/15/2019   Diverticulosis, 5 polyps removed.  Pathology revealed tubular adenomas.  Next colonoscopy in 3 years.   COLONOSCOPY WITH PROPOFOL N/A 08/16/2020   Procedure: COLONOSCOPY WITH PROPOFOL;  Surgeon: Rogene Houston, MD;  [facility]: [facility];  Service: Endoscopy;  Laterality: N/A;   ESOPHAGOGASTRODUODENOSCOPY  12/2009   RMR: Mild esophagitis, gastritis, duodenitis.  Gastric biopsies negative for H. pylori   ESOPHAGOGASTRODUODENOSCOPY (EGD) WITH PROPOFOL N/A 08/15/2020   Procedure: ESOPHAGOGASTRODUODENOSCOPY (EGD) WITH PROPOFOL;  Surgeon: Daneil Dolin, MD;  Location: [facility];  Service: Endoscopy;  Laterality: N/A;  covid + (asymptomatic)   GIVENS CAPSULE STUDY N/A 08/29/2020   Procedure: GIVENS CAPSULE STUDY;  Surgeon: Rogene Houston, MD;  [facility];  Service: Endoscopy;  Laterality: N/A;   POLYPECTOMY  03/15/2019   Procedure: POLYPECTOMY;  Surgeon: Daneil Dolin, MD;  [facility];  Service: Endoscopy;;   Family History Family History  Problem Relation Age of Onset   Colon cancer Father    Stroke Father     Social History Social History   Tobacco Use   Smoking status: Former    Packs/day: 1.00    Years: 40.00    Total pack years: 40.00    Types: Cigarettes   Smokeless tobacco: Never  Vaping Use   Vaping Use: Never used  Substance Use Topics   Alcohol use: Yes    Comment: occasionally   Drug use: No   Allergies Dapagliflozin and Statins  Review of Systems Review of Systems  All other systems reviewed and are negative.   Physical Exam Vital Signs  I have reviewed the triage vital signs BP 108/60   Pulse (!) 106   Temp 98.3 F (36.8 C) (Oral)   Resp (!) 22   Ht '5\' 10"'$  (1.778 m)   Wt 82.8 kg   SpO2 94%   BMI 26.19 kg/m  Physical Exam Vitals and nursing note reviewed.  Constitutional:      General: He is not in acute distress.    Appearance: Normal appearance. He is ill-appearing. He is not toxic-appearing.  HENT:     Mouth/Throat:     Mouth: Mucous membranes are dry.  Eyes:     Conjunctiva/sclera: Conjunctivae normal.  Cardiovascular:     Rate and Rhythm: Normal rate and regular rhythm.  Pulmonary:     Effort: Pulmonary effort is normal. No respiratory distress.     Breath sounds: Examination of the left-lower field reveals rales. Rales present.  Abdominal:     General: Abdomen is flat.     Palpations: Abdomen is soft.     Tenderness: There is no abdominal tenderness.  Musculoskeletal:     Right lower leg: No edema.     Left lower leg: No edema.  Skin:    General: Skin is warm and  dry.     Capillary Refill: Capillary refill takes 2 to 3 seconds.  Neurological:     Mental Status: He is alert and oriented to person, place, and time. Mental status is at baseline.  Psychiatric:        Mood and Affect: Mood normal.        Behavior: Behavior normal.     ED Results and Treatments Labs (all labs ordered are listed, but only abnormal results are displayed) Labs Reviewed  COMPREHENSIVE METABOLIC PANEL - Abnormal; Notable for the following components:      Result Value   CO2 19 (*)    Glucose, Bld 259 (*)    BUN 34 (*)    Creatinine, Ser 2.29 (*)    AST 44 (*)    Total Bilirubin 1.9 (*)    GFR, Estimated 29 (*)    All other components within normal limits  LACTIC ACID, PLASMA - Abnormal; Notable for the following components:   Lactic Acid, Venous 5.6 (*)    All other components within normal limits  LACTIC ACID, PLASMA - Abnormal; Notable for the following components:   Lactic Acid, Venous 6.1 (*)    All other components within normal limits  CBC WITH DIFFERENTIAL/PLATELET - Abnormal; Notable for the following components:   Neutro Abs 8.7 (*)    Lymphs Abs 0.5 (*)    Abs Immature Granulocytes 0.10 (*)    All other components within normal limits  CULTURE, BLOOD (ROUTINE X 2)  CULTURE, BLOOD (ROUTINE X 2)  RESP PANEL BY  RT-PCR (RSV, FLU A&B, COVID)  RVPGX2  MRSA NEXT GEN BY PCR, NASAL  URINE CULTURE  PROTIME-INR  URINALYSIS, ROUTINE W REFLEX MICROSCOPIC  HEMOGLOBIN A1C  PROCALCITONIN  HEMOGLOBIN A1C  URINALYSIS, COMPLETE (UACMP) WITH MICROSCOPIC  LIPASE, BLOOD  LACTIC ACID, PLASMA  HEMOGLOBIN A1C                                                                                                                          Radiology DG Chest Port 1 View  Result Date: 01/12/2022 CLINICAL DATA:  Shortness of breath. EXAM: PORTABLE CHEST 1 VIEW COMPARISON:  Chest and left rib radiographs 06/17/2021, CT chest 04/15/2020 FINDINGS: Cardiac silhouette is upper limits  of normal size. Mediastinal contours are within normal limits. On prior 04/15/2020 CT, left-greater-than-right lower lung curvilinear scarring was seen. Associated curvilinear opacities and interstitial thickening on the current study appear slightly increased compared to 06/17/2021 frontal chest radiograph. Increased lucencies and attenuation of the superior pulmonary vasculature corresponding to chronic emphysematous changes seen on prior CT. No pleural effusion pneumothorax. No acute skeletal abnormality. IMPRESSION: Chronic left-greater-than-right lower lung scarring. The associated curvilinear opacities appear slightly thickened compared to 06/17/2021 prior radiographs, however otherwise no focal airspace opacity is seen. This may be due to increased overlying atelectasis. It is difficult to completely exclude left basilar pneumonia. Electronically Signed   By: Yvonne Kendall M.D.   On: 01/12/2022 12:49    Pertinent labs & imaging results that were available during my care of the patient were reviewed by me and considered in my medical decision making (see MDM for details).  Medications Ordered in ED Medications  azithromycin (ZITHROMAX) 500 mg in sodium chloride 0.9 % 250 mL IVPB (0 mg Intravenous Stopped 01/12/22 1426)  vancomycin (VANCOREADY) IVPB 750 mg/150 mL (has no administration in time range)  0.9 %  sodium chloride infusion (250 mLs Intravenous Not Given 01/12/22 1521)  norepinephrine (LEVOPHED) '4mg'$  in 259m (0.016 mg/mL) premix infusion (3 mcg/min Intravenous Infusion Verify 01/12/22 1529)  Chlorhexidine Gluconate Cloth 2 % PADS 6 each (6 each Topical Given 01/12/22 1521)  albuterol (PROVENTIL) (2.5 MG/3ML) 0.083% nebulizer solution 2.5 mg (has no administration in time range)  pantoprazole (PROTONIX) EC tablet 40 mg (has no administration in time range)  clopidogrel (PLAVIX) tablet 75 mg (has no administration in time range)  cyanocobalamin (VITAMIN B12) tablet 1,000 mcg (has no administration  in time range)  gabapentin (NEURONTIN) capsule 100 mg (has no administration in time range)  loratadine (CLARITIN) tablet 10 mg (has no administration in time range)  heparin injection 5,000 Units (has no administration in time range)  acetaminophen (TYLENOL) tablet 650 mg (has no administration in time range)    Or  acetaminophen (TYLENOL) suppository 650 mg (has no administration in time range)  ondansetron (ZOFRAN) tablet 4 mg (has no administration in time range)    Or  ondansetron (ZOFRAN) injection 4 mg (has no administration in time range)  0.9 %  sodium chloride infusion (  has no administration in time range)  insulin aspart (novoLOG) injection 0-9 Units (has no administration in time range)  Oral care mouth rinse (has no administration in time range)  ceFEPIme (MAXIPIME) 2 g in sodium chloride 0.9 % 100 mL IVPB (has no administration in time range)  lactated ringers bolus 2,505 mL (0 mLs Intravenous Stopped 01/12/22 1317)  cefTRIAXone (ROCEPHIN) 2 g in sodium chloride 0.9 % 100 mL IVPB (0 g Intravenous Stopped 01/12/22 1329)  vancomycin (VANCOREADY) IVPB 1500 mg/300 mL (0 mg Intravenous Stopped 01/12/22 1519)  acetaminophen (TYLENOL) tablet 1,000 mg (1,000 mg Oral Given 01/12/22 1328)                                                                                                                                     Procedures .Critical Care  Performed by: Cristie Hem, MD Authorized by: Cristie Hem, MD   Critical care provider statement:    Critical care time (minutes):  30   Critical care was necessary to treat or prevent imminent or life-threatening deterioration of the following conditions:  Sepsis and shock   Critical care was time spent personally by me on the following activities:  Development of treatment plan with patient or surrogate, discussions with consultants, evaluation of patient's response to treatment, examination of patient, ordering and review of  laboratory studies, ordering and review of radiographic studies, ordering and performing treatments and interventions, pulse oximetry, re-evaluation of patient's condition and review of old charts   (including critical care time)  Medical Decision Making / ED Course   MDM:  77 year old male presenting to the emergency department with weakness.  Patient ill-appearing, hypotensive with lowest blood pressure of 61/37.  Not febrile.  Borderline tachycardia, likely affected by home metoprolol.  Suspect sepsis given symptoms, his primary complaint is cough, his exam is has some left lower lobe crackles and his chest x-ray shows possible pneumonia there.  He is not having any urinary symptoms.  He was having nausea vomiting and diarrhea but no abdominal tenderness or pain.  Will add stool studies.  No headache, confusion to suggest meningitis.  No evidence of skin or soft tissue infection.  Doubt cardiogenic shock or obstructive shock.  Patient's blood pressure did improve with IV fluids.  Will cover with antibiotics and obtain blood cultures.  Initial lactate significantly elevated.  Clinical Course as of 01/12/22 1540  Mon Jan 12, 2022  1540 Repeat lactate elevated.  Started on norepinephrine and admitted to the hospitalist service. [WS]    Clinical Course User Index [WS] Cristie Hem, MD     Additional history obtained: -Additional history obtained from family -External records from outside source obtained and reviewed including: Chart review including previous notes, labs, imaging, consultation notes including office visit 10/31/21   Lab Tests: -I ordered, reviewed, and interpreted labs.   The pertinent results include:   Labs Reviewed  COMPREHENSIVE METABOLIC PANEL -  Abnormal; Notable for the following components:      Result Value   CO2 19 (*)    Glucose, Bld 259 (*)    BUN 34 (*)    Creatinine, Ser 2.29 (*)    AST 44 (*)    Total Bilirubin 1.9 (*)    GFR, Estimated 29  (*)    All other components within normal limits  LACTIC ACID, PLASMA - Abnormal; Notable for the following components:   Lactic Acid, Venous 5.6 (*)    All other components within normal limits  LACTIC ACID, PLASMA - Abnormal; Notable for the following components:   Lactic Acid, Venous 6.1 (*)    All other components within normal limits  CBC WITH DIFFERENTIAL/PLATELET - Abnormal; Notable for the following components:   Neutro Abs 8.7 (*)    Lymphs Abs 0.5 (*)    Abs Immature Granulocytes 0.10 (*)    All other components within normal limits  CULTURE, BLOOD (ROUTINE X 2)  CULTURE, BLOOD (ROUTINE X 2)  RESP PANEL BY RT-PCR (RSV, FLU A&B, COVID)  RVPGX2  MRSA NEXT GEN BY PCR, NASAL  URINE CULTURE  PROTIME-INR  URINALYSIS, ROUTINE W REFLEX MICROSCOPIC  HEMOGLOBIN A1C  PROCALCITONIN  HEMOGLOBIN A1C  URINALYSIS, COMPLETE (UACMP) WITH MICROSCOPIC  LIPASE, BLOOD  LACTIC ACID, PLASMA  HEMOGLOBIN A1C    Notable for elevated lactate, acute kidney injury, high neutrophils  EKG   EKG Interpretation  Date/Time:  Monday January 12 2022 11:47:54 EST Ventricular Rate:  100 PR Interval:  148 QRS Duration: 102 QT Interval:  343 QTC Calculation: 443 R Axis:   71 Text Interpretation: Sinus tachycardia Baseline wander in lead(s) V5 Confirmed by Garnette Gunner [phone]) on 01/12/2022 12:50:40 PM         Imaging Studies ordered: I ordered imaging studies including CXR On my interpretation imaging demonstrates possible LLL PNA I independently visualized and interpreted imaging. I agree with the radiologist interpretation   Medicines ordered and prescription drug management: Meds ordered this encounter  Medications   DISCONTD: albuterol (VENTOLIN HFA) 108 (90 Base) MCG/ACT inhaler 2 puff   lactated ringers bolus 2,505 mL   cefTRIAXone (ROCEPHIN) 2 g in sodium chloride 0.9 % 100 mL IVPB    Order Specific Question:   Antibiotic Indication:    Answer:   Bacteremia   azithromycin  (ZITHROMAX) 500 mg in sodium chloride 0.9 % 250 mL IVPB    Order Specific Question:   Antibiotic Indication:    Answer:   CAP   vancomycin (VANCOREADY) IVPB 1500 mg/300 mL    Order Specific Question:   Indication:    Answer:   Bacteremia   acetaminophen (TYLENOL) tablet 1,000 mg   vancomycin (VANCOREADY) IVPB 750 mg/150 mL    Order Specific Question:   Indication:    Answer:   Sepsis    Order Specific Question:   Other Indication:    Answer:   bacteremia   0.9 %  sodium chloride infusion   norepinephrine (LEVOPHED) '4mg'$  in 262m (0.016 mg/mL) premix infusion    Order Specific Question:   IV Access    Answer:   Peripheral   Chlorhexidine Gluconate Cloth 2 % PADS 6 each   albuterol (PROVENTIL) (2.5 MG/3ML) 0.083% nebulizer solution 2.5 mg   pantoprazole (PROTONIX) EC tablet 40 mg   clopidogrel (PLAVIX) tablet 75 mg   cyanocobalamin (VITAMIN B12) tablet 1,000 mcg   gabapentin (NEURONTIN) capsule 100 mg   loratadine (CLARITIN) tablet 10 mg   heparin injection  5,000 Units   OR Linked Order Group    acetaminophen (TYLENOL) tablet 650 mg    acetaminophen (TYLENOL) suppository 650 mg   OR Linked Order Group    ondansetron (ZOFRAN) tablet 4 mg    ondansetron (ZOFRAN) injection 4 mg   0.9 %  sodium chloride infusion   insulin aspart (novoLOG) injection 0-9 Units    Order Specific Question:   Correction coverage:    Answer:   Sensitive (thin, NPO, renal)    Order Specific Question:   CBG < 70:    Answer:   Implement Hypoglycemia Standing Orders and refer to Hypoglycemia Standing Orders sidebar report    Order Specific Question:   CBG 70 - 120:    Answer:   0 units    Order Specific Question:   CBG 121 - 150:    Answer:   1 unit    Order Specific Question:   CBG 151 - 200:    Answer:   2 units    Order Specific Question:   CBG 201 - 250:    Answer:   3 units    Order Specific Question:   CBG 251 - 300:    Answer:   5 units    Order Specific Question:   CBG 301 - 350:    Answer:   7  units    Order Specific Question:   CBG 351 - 400    Answer:   9 units    Order Specific Question:   CBG > 400    Answer:   call MD and obtain STAT lab verification   Oral care mouth rinse   ceFEPIme (MAXIPIME) 2 g in sodium chloride 0.9 % 100 mL IVPB    Order Specific Question:   Antibiotic Indication:    Answer:   Sepsis    -I have reviewed the patients home medicines and have made adjustments as needed   Consultations Obtained: I requested consultation with the hospitalist,  and discussed lab and imaging findings as well as pertinent plan - they recommend: admission   Cardiac Monitoring: The patient was maintained on a cardiac monitor.  I personally viewed and interpreted the cardiac monitored which showed an underlying rhythm of: sinus tachycardia  Social Determinants of Health:  Diagnosis or treatment significantly limited by social determinants of health: former smoker   Reevaluation: After the interventions noted above, I reevaluated the patient and found that they have improved  Co morbidities that complicate the patient evaluation  Past Medical History:  Diagnosis Date   CKD (chronic kidney disease), stage IIIa ([place]) 08/15/2020   CVA (cerebral infarction)    2006   DM (dermatomyositis)    Gastritis    GERD (gastroesophageal reflux disease)    Hemorrhoids    Hypercholesteremia    Hypertension    Lower GI bleed    SECONDARY TO DIVERTICULA,S/P BLEED   Overweight (BMI 25.0-29.9) 08/15/2020   Shortness of breath dyspnea    Type 2 diabetes mellitus ([facility])       Dispostion: Disposition decision including need for hospitalization was considered, and patient admitted to the hospital.    Final Clinical Impression(s) / ED Diagnoses Final diagnoses:  Septic shock ([place])  Community acquired pneumonia of left lower lobe of lung     This chart was dictated using voice recognition software.  Despite best efforts to proofread,  errors can occur which can change the  documentation meaning.    Cristie Hem, MD 01/12/22 1540

## 2022-01-12 NOTE — Sepsis Progress Note (Signed)
Notified provider of need to order another repeat lactic acid, as the second result was higher than the first.

## 2022-01-12 NOTE — Hospital Course (Signed)
77 year old male with a history of CKD, diabetes mellitus type 2, hypertension, hyperlipidemia, stroke presenting with 1 to 2-day history of generalized weakness, chest discomfort, shortness of breath, and coughing yellow sputum.  He started feeling bad on 01/10/2022, but symptoms significantly worsened on 01/11/2022 when he began developing nausea, vomiting, and diarrhea.  The patient has chronic loose stools for which he takes Imodium.  He states that he takes Imodium 2-3 times per week, last dose on 01/10/2022.  He had some fevers and chills and cold sweats on the evening of 01/11/2022.  There is no hematemesis, hemoptysis, hematochezia, melena.  He denies any dysuria or hematuria.  He states that his chest discomfort is primarily right-sided and pleuritic in nature.  On 01/12/2022, he had worsening generalized weakness to the point where he had difficulty getting into the car to come to the emergency department. In the ED, the patient was afebrile and hypotensive with blood pressure 79/38.  Oxygen saturation was 99% on 2 L.  WBC 9.9, hemoglobin 14.5, platelets 227,000.  Sodium 137, potassium 4.2, bicarbonate 19, serum creatinine 2.29.  LFTs showed AST 44, ALT 38, alk phosphatase 59, total bilirubin 1.9. Chest x-ray showed chronic right greater than left lower lobe scarring.  There was associated curvilinear opacities and interstitial thickening in the left lower lobe.  The patient was started on vancomycin, ceftriaxone, and azithromycin.  He was admitted for further evaluation and treatment of his sepsis.

## 2022-01-12 NOTE — ED Triage Notes (Addendum)
Pt c/o shaking with n//v/d last night. C/o dizziness today. Gen weakness noted. C/o sob. 82% ra in triage and taking straight back to room

## 2022-01-12 NOTE — ED Notes (Signed)
Both sets of blood cultures drawn before antibiotic administration  

## 2022-01-12 NOTE — ED Notes (Signed)
Pt a/o, pale. Non diaphoretic.

## 2022-01-12 NOTE — Sepsis Progress Note (Signed)
Notified provider of need to order antibiotics.   

## 2022-01-13 ENCOUNTER — Inpatient Hospital Stay (HOSPITAL_COMMUNITY): Payer: Medicare Other

## 2022-01-13 DIAGNOSIS — R6521 Severe sepsis with septic shock: Secondary | ICD-10-CM

## 2022-01-13 DIAGNOSIS — A419 Sepsis, unspecified organism: Secondary | ICD-10-CM

## 2022-01-13 DIAGNOSIS — J9601 Acute respiratory failure with hypoxia: Secondary | ICD-10-CM

## 2022-01-13 DIAGNOSIS — J181 Lobar pneumonia, unspecified organism: Secondary | ICD-10-CM | POA: Diagnosis not present

## 2022-01-13 DIAGNOSIS — N170 Acute kidney failure with tubular necrosis: Secondary | ICD-10-CM

## 2022-01-13 LAB — COMPREHENSIVE METABOLIC PANEL
ALT: 29 U/L (ref 0–44)
AST: 25 U/L (ref 15–41)
Albumin: 3 g/dL — ABNORMAL LOW (ref 3.5–5.0)
Alkaline Phosphatase: 47 U/L (ref 38–126)
Anion gap: 10 (ref 5–15)
BUN: 32 mg/dL — ABNORMAL HIGH (ref 8–23)
CO2: 21 mmol/L — ABNORMAL LOW (ref 22–32)
Calcium: 8.3 mg/dL — ABNORMAL LOW (ref 8.9–10.3)
Chloride: 108 mmol/L (ref 98–111)
Creatinine, Ser: 1.49 mg/dL — ABNORMAL HIGH (ref 0.61–1.24)
GFR, Estimated: 49 mL/min — ABNORMAL LOW (ref >60)
Glucose, Bld: 158 mg/dL — ABNORMAL HIGH (ref 70–99)
Potassium: 4.3 mmol/L (ref 3.5–5.1)
Sodium: 139 mmol/L (ref 135–145)
Total Bilirubin: 0.7 mg/dL (ref 0.3–1.2)
Total Protein: 5.8 g/dL — ABNORMAL LOW (ref 6.5–8.1)

## 2022-01-13 LAB — CBC
HCT: 36.1 % — ABNORMAL LOW (ref 39.0–52.0)
Hemoglobin: 12.1 g/dL — ABNORMAL LOW (ref 13.0–17.0)
MCH: 32.5 pg (ref 26.0–34.0)
MCHC: 33.5 g/dL (ref 30.0–36.0)
MCV: 97 fL (ref 80.0–100.0)
Platelets: 179 10*3/uL (ref 150–400)
RBC: 3.72 MIL/uL — ABNORMAL LOW (ref 4.22–5.81)
RDW: 13.3 % (ref 11.5–15.5)
WBC: 18.2 10*3/uL — ABNORMAL HIGH (ref 4.0–10.5)
nRBC: 0 % (ref 0.0–0.2)

## 2022-01-13 LAB — GLUCOSE, CAPILLARY
Glucose-Capillary: 123 mg/dL — ABNORMAL HIGH (ref 70–99)
Glucose-Capillary: 159 mg/dL — ABNORMAL HIGH (ref 70–99)
Glucose-Capillary: 129 mg/dL — ABNORMAL HIGH (ref 70–99)
Glucose-Capillary: 151 mg/dL — ABNORMAL HIGH (ref 70–99)

## 2022-01-13 LAB — PROCALCITONIN: Procalcitonin: 32.32 ng/mL

## 2022-01-13 LAB — HEMOGLOBIN A1C
Hgb A1c MFr Bld: 6.4 % — ABNORMAL HIGH (ref 4.8–5.6)
Mean Plasma Glucose: 137 mg/dL

## 2022-01-13 MED ORDER — LACTATED RINGERS IV SOLN: INTRAVENOUS | Status: DC

## 2022-01-13 MED ORDER — MIDODRINE HCL 5 MG PO TABS
5.0000 mg | ORAL_TABLET | Freq: Three times a day (TID) | ORAL | Status: DC
  Administered 2022-01-13: 5 mg via ORAL
  Filled 2022-01-13: qty 1

## 2022-01-13 NOTE — Progress Notes (Signed)
Patient heart rate has been tachy between 100-115 which is not much change from baseline since admission. Patient asymptomatic with no complaints. Dr Tat aware per Dr Tat no changes at this time.

## 2022-01-13 NOTE — Progress Notes (Signed)
PROGRESS NOTE  David Pruitt LSL:[id number] DOB: 10/31/1945 DOA: 01/12/2022 PCP: Sharilyn Sites, MD  Brief History:  77 year old male with a history of CKD, diabetes mellitus type 2, hypertension, hyperlipidemia, stroke presenting with 1 to 2-day history of generalized weakness, chest discomfort, shortness of breath, and coughing yellow sputum.  He started feeling bad on 01/10/2022, but symptoms significantly worsened on 01/11/2022 when he began developing nausea, vomiting, and diarrhea.  The patient has chronic loose stools for which he takes Imodium.  He states that he takes Imodium 2-3 times per week, last dose on 01/10/2022.  He had some fevers and chills and cold sweats on the evening of 01/11/2022.  There is no hematemesis, hemoptysis, hematochezia, melena.  He denies any dysuria or hematuria.  He states that his chest discomfort is primarily right-sided and pleuritic in nature.  On 01/12/2022, he had worsening generalized weakness to the point where he had difficulty getting into the car to come to the emergency department. In the ED, the patient was afebrile and hypotensive with blood pressure 79/38.  Oxygen saturation was 99% on 2 L.  WBC 9.9, hemoglobin 14.5, platelets 227,000.  Sodium 137, potassium 4.2, bicarbonate 19, serum creatinine 2.29.  LFTs showed AST 44, ALT 38, alk phosphatase 59, total bilirubin 1.9. Chest x-ray showed chronic right greater than left lower lobe scarring.  There was associated curvilinear opacities and interstitial thickening in the left lower lobe.  The patient was started on vancomycin, ceftriaxone, and azithromycin.  He was admitted for further evaluation and treatment of his sepsis.   Assessment/Plan:  Septic shock -Presented with tachycardia, hypotension, lactic acid 6.1 -Secondary to pneumonia -started vancomycin, ceftriaxone, and azithromycin -Follow blood cultures--neg -Obtain UA--no pyuria -Check PCT 46.08>>32.32 -Continue IV  fluids -Levophed 5 mcg/kg/min   Lobar pneumonia -Chest x-ray suggests left lower lobe interstitial thickening and curvilinear opacities -CT chest RLL consolidation; R>L basilar interstitial thickening with GGO and bronchial wall thickening -Check PCT 46.08>>32.32 -MRSA screen--neg -d/c vanc -Continue cefepime and azithromycin -Check COVID-19 PCR and influenza--negative  Acute respiratory Failure with hypoxia -stable on 2L -wean for saturation > 92%   Acute on chronic renal failure--CKD 3a -Baseline creatinine 1.2-1.5 -Presented with serum creatinine 2.29 -Secondary to volume depletion and sepsis/hemodynamic changes -continue IVF>>imrproving   Transaminasemia -Right upper quadrant ultrasound--neg -secondary to sepsis and hypotension -improved   Lactic acidosis -Continue IV fluids -Continue to trend   Essential hypertension -Holding metoprolol and lisinopril secondary to hypotension -pt has rebound tachycardia from holding BB   Diabetes mellitus type 2 -Holding Ozempic, metformin and glipizide -NovoLog sliding scale -Check hemoglobin A1c--pending   History of stroke -Continue Plavix       Family Communication:   spouse at bedside 01/13/22  Consultants:  none  Code Status:  FULL   DVT Prophylaxis:  [place] Heparin    Procedures: As Listed in Progress Note Above  Antibiotics: Cefepime 1/1>> Azithro 1/1>> Vanc 1/1>>1/2   The patient is critically ill with multiple organ systems failure and requires high complexity decision making for assessment and support, frequent evaluation and titration of therapies, application of advanced monitoring technologies and extensive interpretation of multiple databases.  Critical care time - 35 mins.     Subjective:  Has generalized weakness. He had dry cough.  Sob improving.  Denies f/c, cp, n/v/d, abd pain Objective: Vitals:   01/13/22 1345 01/13/22 1400 01/13/22 1415 01/13/22 1600  BP: (!) 120/47 131/80 134/71    Pulse: Marland Kitchen)  103 (!) 108 (!) 105   Resp:      Temp:    98.6 F (37 C)  TempSrc:    Oral  SpO2:   100%   Weight:      Height:        Intake/Output Summary (Last 24 hours) at 01/13/2022 1617 Last data filed at 01/13/2022 1452 Gross per 24 hour  Intake 3462.07 ml  Output 2675 ml  Net 787.07 ml   Weight change:  Exam:  General:  Pt is alert, follows commands appropriately, not in acute distress HEENT: No icterus, No thrush, No neck mass, [place]/AT Cardiovascular: RRR, S1/S2, no rubs, no gallops Respiratory: diminished BS.  Bilateral rales Abdomen: Soft/+BS, non tender, non distended, no guarding Extremities: No edema, No lymphangitis, No petechiae, No rashes, no synovitis   Data Reviewed: I have personally reviewed following labs and imaging studies Basic Metabolic Panel: Recent Labs  Lab 01/12/22 1147 01/13/22 0451  NA 137 139  K 4.2 4.3  CL 103 108  CO2 19* 21*  GLUCOSE 259* 158*  BUN 34* 32*  CREATININE 2.29* 1.49*  CALCIUM 9.2 8.3*   Liver Function Tests: Recent Labs  Lab 01/12/22 1147 01/13/22 0451  AST 44* 25  ALT 38 29  ALKPHOS 59 47  BILITOT 1.9* 0.7  PROT 6.8 5.8*  ALBUMIN 3.8 3.0*   Recent Labs  Lab 01/12/22 1604  LIPASE 25   No results for input(s): "AMMONIA" in the last 168 hours. Coagulation Profile: Recent Labs  Lab 01/12/22 1147  INR 1.1   CBC: Recent Labs  Lab 01/12/22 1150 01/13/22 0451  WBC 9.9 18.2*  NEUTROABS 8.7*  --   HGB 14.5 12.1*  HCT 44.0 36.1*  MCV 97.6 97.0  PLT 227 179   Cardiac Enzymes: No results for input(s): "CKTOTAL", "CKMB", "CKMBINDEX", "TROPONINI" in the last 168 hours. BNP: Invalid input(s): "POCBNP" CBG: Recent Labs  Lab 01/12/22 1552 01/12/22 2020 01/13/22 0704 01/13/22 1108 01/13/22 1558  GLUCAP 152* 159* 123* 159* 129*   HbA1C: No results for input(s): "HGBA1C" in the last 72 hours. Urine analysis:    Component Value Date/Time   COLORURINE YELLOW 01/12/2022 1953   APPEARANCEUR HAZY (A)  01/12/2022 1953   APPEARANCEUR Clear 10/09/2020 1438   LABSPEC 1.021 01/12/2022 1953   PHURINE 5.0 01/12/2022 1953   GLUCOSEU NEGATIVE 01/12/2022 1953   HGBUR NEGATIVE 01/12/2022 1953   BILIRUBINUR NEGATIVE 01/12/2022 1953   BILIRUBINUR Negative 10/09/2020 [place] NEGATIVE 01/12/2022 1953   PROTEINUR 30 (A) 01/12/2022 1953   NITRITE NEGATIVE 01/12/2022 1953   LEUKOCYTESUR NEGATIVE 01/12/2022 1953   Sepsis Labs: _0 (procalcitonin:4,lacticidven:4) ) Recent Results (from the past 240 hour(s))  Culture, blood (Routine x 2)     Status: None (Preliminary result)   Collection Time: 01/12/22 11:50 AM   Specimen: BLOOD  Result Value Ref Range Status   Specimen Description BLOOD LEFT ANTECUBITAL  Final   Special Requests   Final    BOTTLES DRAWN AEROBIC AND ANAEROBIC Blood Culture adequate volume   Culture   Final    NO GROWTH < 24 HOURS Performed at [facility], [address] [place], [place] [address]    Report Status PENDING  Incomplete  Culture, blood (Routine x 2)     Status: None (Preliminary result)   Collection Time: 01/12/22 11:55 AM   Specimen: BLOOD  Result Value Ref Range Status   Specimen Description BLOOD BLOOD RIGHT FOREARM  Final   Special Requests   Final  BOTTLES DRAWN AEROBIC AND ANAEROBIC Blood Culture adequate volume   Culture   Final    NO GROWTH < 24 HOURS Performed at [facility], [address], [place], [place] [address]    Report Status PENDING  Incomplete  Resp panel by RT-PCR (RSV, Flu A&B, Covid) Anterior Nasal Swab     Status: None   Collection Time: 01/12/22 12:09 PM   Specimen: Anterior Nasal Swab  Result Value Ref Range Status   SARS Coronavirus 2 by RT PCR NEGATIVE NEGATIVE Final    Comment: (NOTE) SARS-CoV-2 target nucleic acids are NOT DETECTED.  The SARS-CoV-2 RNA is generally detectable in upper respiratory specimens during the acute phase of infection. The lowest concentration of SARS-CoV-2 viral copies this assay  can detect is 138 copies/mL. A negative result does not preclude SARS-Cov-2 infection and should not be used as the sole basis for treatment or other patient management decisions. A negative result may occur with  improper specimen collection/handling, submission of specimen other than nasopharyngeal swab, presence of viral mutation(s) within the areas targeted by this assay, and inadequate number of viral copies(<138 copies/mL). A negative result must be combined with clinical observations, patient history, and epidemiological information. The expected result is Negative.  Fact Sheet for Patients:  EntrepreneurPulse.com.au  Fact Sheet for Healthcare Providers:  IncredibleEmployment.be  This test is no t yet approved or cleared by the [place] FDA and  has been authorized for detection and/or diagnosis of SARS-CoV-2 by FDA under an Emergency Use Authorization (EUA). This EUA will remain  in effect (meaning this test can be used) for the duration of the COVID-19 declaration under Section 564(b)(1) of the Act, 21 U.S.C.section 360bbb-3(b)(1), unless the authorization is terminated  or revoked sooner.       Influenza A by PCR NEGATIVE NEGATIVE Final   Influenza B by PCR NEGATIVE NEGATIVE Final    Comment: (NOTE) The Xpert Xpress SARS-CoV-2/FLU/RSV plus assay is intended as an aid in the diagnosis of influenza from Nasopharyngeal swab specimens and should not be used as a sole basis for treatment. Nasal washings and aspirates are unacceptable for Xpert Xpress SARS-CoV-2/FLU/RSV testing.  Fact Sheet for Patients: EntrepreneurPulse.com.au  Fact Sheet for Healthcare Providers: IncredibleEmployment.be  This test is not yet approved or cleared by the [place] FDA and has been authorized for detection and/or diagnosis of SARS-CoV-2 by FDA under an Emergency Use Authorization (EUA). This EUA will  remain in effect (meaning this test can be used) for the duration of the COVID-19 declaration under Section 564(b)(1) of the Act, 21 U.S.C. section 360bbb-3(b)(1), unless the authorization is terminated or revoked.     Resp Syncytial Virus by PCR NEGATIVE NEGATIVE Final    Comment: (NOTE) Fact Sheet for Patients: EntrepreneurPulse.com.au  Fact Sheet for Healthcare Providers: IncredibleEmployment.be  This test is not yet approved or cleared by the [place] FDA and has been authorized for detection and/or diagnosis of SARS-CoV-2 by FDA under an Emergency Use Authorization (EUA). This EUA will remain in effect (meaning this test can be used) for the duration of the COVID-19 declaration under Section 564(b)(1) of the Act, 21 U.S.C. section 360bbb-3(b)(1), unless the authorization is terminated or revoked.  Performed at [facility], [address] [place], [place] [address]   MRSA Next Gen by PCR, Nasal     Status: None   Collection Time: 01/12/22  3:05 PM   Specimen: Nasal Mucosa; Nasal Swab  Result Value Ref Range Status   MRSA by PCR Next  Gen NOT DETECTED NOT DETECTED Final    Comment: (NOTE) The GeneXpert MRSA Assay (FDA approved for NASAL specimens only), is one component of a comprehensive MRSA colonization surveillance program. It is not intended to diagnose MRSA infection nor to guide or monitor treatment for MRSA infections. Test performance is not FDA approved in patients less than 34 years old. Performed at [facility], [address], [place], [place] [address]      Scheduled Meds:  Chlorhexidine Gluconate Cloth  6 each Topical Daily   clopidogrel  75 mg Oral QHS   cyanocobalamin  1,000 mcg Oral Daily   gabapentin  100 mg Oral QHS   heparin  5,000 Units Subcutaneous Q8H   insulin aspart  0-9 Units Subcutaneous TID WC   loratadine  10 mg Oral QHS   pantoprazole  40 mg Oral Daily   Continuous Infusions:  sodium chloride      sodium chloride Stopped (01/13/22 1312)   azithromycin Stopped (01/13/22 1301)   ceFEPime (MAXIPIME) IV Stopped (01/13/22 9179)   norepinephrine (LEVOPHED) Adult infusion 6 mcg/min (01/13/22 1402)   vancomycin 150 mL/hr at 01/13/22 1402    Procedures/Studies: US Abdomen Limited RUQ (LIVER/GB)  Result Date: 01/13/2022 CLINICAL DATA:  77 year old male with elevated LFTs. EXAM: ULTRASOUND ABDOMEN LIMITED RIGHT UPPER QUADRANT COMPARISON:  12/02/2018 CT FINDINGS: Gallbladder: The gallbladder is unremarkable. There is no evidence of cholelithiasis or acute cholecystitis. Common bile duct: Diameter: 4 mm. There is no evidence of intrahepatic or extrahepatic biliary dilatation. Liver: No focal lesion identified. Within normal limits in parenchymal echogenicity. Portal vein is patent on color Doppler imaging with normal direction of blood flow towards the liver. Other: None. IMPRESSION: Unremarkable RIGHT UPPER quadrant abdominal ultrasound. Electronically Signed   By: Margarette Canada M.D.   On: 01/13/2022 09:26   CT CHEST WO CONTRAST  Result Date: 01/12/2022 CLINICAL DATA:  Respiratory illness, nondiagnostic x-ray. Shortness of breath and generalized weakness EXAM: CT CHEST WITHOUT CONTRAST TECHNIQUE: Multidetector CT imaging of the chest was performed following the standard protocol without IV contrast. RADIATION DOSE REDUCTION: This exam was performed according to the departmental dose-optimization program which includes automated exposure control, adjustment of the mA and/or kV according to patient size and/or use of iterative reconstruction technique. COMPARISON:  Chest CT April 15, 2020. FINDINGS: Cardiovascular: Aortic and branch vessel atherosclerosis. Enlarged main pulmonary artery measuring 3.2 cm. Coronary artery calcifications. Normal size heart. No significant pericardial effusion/thickening. Mediastinum/Nodes: No suspicious thyroid nodule. No pathologically enlarged mediastinal, hilar or axillary  lymph nodes, noting limited sensitivity for the detection of hilar adenopathy on this noncontrast study. Small hiatal hernia. Lungs/Pleura: Right lower lobe consolidation with right-greater-than-left basilar interstitial thickening and interposed ground-glass opacities and bronchial wall thickening. Similar left pleural thickening with linear bands of scarring/architectural distortion. Stable calcified subpleural pulmonary nodules in the left lower lobe. Evaluation of the noncalcified pulmonary nodule seen on prior CTs areas limited by acute inflammatory process. Similar biapical pleuroparenchymal scarring. Paraseptal and centrilobular emphysema. Upper Abdomen: Nonobstructive left upper pole renal stone. Left upper pole renal lesion measures 15 mm at Hounsfield units of 22, greater than that expected for a simple cyst. Additional 18 mm hypodense left upper pole renal lesion measures Hounsfield units of 7 compatible with a benign cysts. Musculoskeletal: Multilevel degenerative changes spine. IMPRESSION: 1. Right lower lobe consolidation with right-greater-than-left basilar interstitial thickening and interposed ground-glass opacities and bronchial wall thickening, most consistent with an infectious or inflammatory process. Suggest follow-up CT in 2-3 months to ensure resolution.  2. Enlarged main pulmonary artery, which can be seen in the setting of pulmonary arterial hypertension. 3. Left upper pole renal lesion measures 15 mm at Hounsfield units of 22, greater than that expected for a simple cyst. Recommend further evaluation with nonemergent renal ultrasound. 4. Nonobstructive left upper pole renal stone. 5.  Aortic Atherosclerosis (ICD10-I70.0). Electronically Signed   By: Dahlia Bailiff M.D.   On: 01/12/2022 20:11   DG Chest Port 1 View  Result Date: 01/12/2022 CLINICAL DATA:  Shortness of breath. EXAM: PORTABLE CHEST 1 VIEW COMPARISON:  Chest and left rib radiographs 06/17/2021, CT chest 04/15/2020 FINDINGS:  Cardiac silhouette is upper limits of normal size. Mediastinal contours are within normal limits. On prior 04/15/2020 CT, left-greater-than-right lower lung curvilinear scarring was seen. Associated curvilinear opacities and interstitial thickening on the current study appear slightly increased compared to 06/17/2021 frontal chest radiograph. Increased lucencies and attenuation of the superior pulmonary vasculature corresponding to chronic emphysematous changes seen on prior CT. No pleural effusion pneumothorax. No acute skeletal abnormality. IMPRESSION: Chronic left-greater-than-right lower lung scarring. The associated curvilinear opacities appear slightly thickened compared to 06/17/2021 prior radiographs, however otherwise no focal airspace opacity is seen. This may be due to increased overlying atelectasis. It is difficult to completely exclude left basilar pneumonia. Electronically Signed   By: Yvonne Kendall M.D.   On: 01/12/2022 12:49    Orson Eva, DO  [facility]  If 7PM-7AM, please contact night-coverage [email or web address] Password [facility] 01/13/2022, 4:17 PM   LOS: 1 day

## 2022-01-13 NOTE — TOC Progression Note (Signed)
  Transition of Care [facility]) Screening Note   Patient Details  Name: David Pruitt Date of Birth: 06-26-1945   Transition of Care [facility]) CM/SW Contact:    Boneta Lucks, RN Phone Number: 01/13/2022, 9:55 AM    Transition of Care Department [facility]) has reviewed patient and no TOC needs have been identified at this time. We will continue to monitor patient advancement through interdisciplinary progression rounds. If new patient transition needs arise, please place a TOC consult.     Barriers to Discharge: Continued Medical Work up  Expected Discharge Plan and Services      Living arrangements for the past 2 months: Single Family Home                     Social Determinants of Health (SDOH) Interventions SDOH Screenings   Food Insecurity: No Food Insecurity (01/12/2022)  Housing: Low Risk  (01/12/2022)  Transportation Needs: No Transportation Needs (01/12/2022)  Utilities: Not At Risk (01/12/2022)  Tobacco Use: Medium Risk (01/12/2022)

## 2022-01-14 ENCOUNTER — Inpatient Hospital Stay (HOSPITAL_COMMUNITY): Payer: Medicare Other

## 2022-01-14 DIAGNOSIS — J9601 Acute respiratory failure with hypoxia: Secondary | ICD-10-CM | POA: Diagnosis not present

## 2022-01-14 DIAGNOSIS — A419 Sepsis, unspecified organism: Secondary | ICD-10-CM | POA: Diagnosis not present

## 2022-01-14 DIAGNOSIS — J181 Lobar pneumonia, unspecified organism: Secondary | ICD-10-CM | POA: Diagnosis not present

## 2022-01-14 DIAGNOSIS — E872 Acidosis, unspecified: Secondary | ICD-10-CM | POA: Diagnosis not present

## 2022-01-14 LAB — URINE CULTURE: Culture: NO GROWTH

## 2022-01-14 LAB — GLUCOSE, CAPILLARY
Glucose-Capillary: 117 mg/dL — ABNORMAL HIGH (ref 70–99)
Glucose-Capillary: 144 mg/dL — ABNORMAL HIGH (ref 70–99)
Glucose-Capillary: 156 mg/dL — ABNORMAL HIGH (ref 70–99)
Glucose-Capillary: 178 mg/dL — ABNORMAL HIGH (ref 70–99)

## 2022-01-14 LAB — CREATININE, SERUM
Creatinine, Ser: 1.03 mg/dL (ref 0.61–1.24)
GFR, Estimated: >60 mL/min (ref >60)

## 2022-01-14 LAB — PROCALCITONIN: Procalcitonin: 17.15 ng/mL

## 2022-01-14 MED ORDER — SODIUM CHLORIDE 0.9 % IV SOLN
2.0000 g | Freq: Three times a day (TID) | INTRAVENOUS | Status: DC
  Administered 2022-01-14 – 2022-01-16 (×6): 2 g via INTRAVENOUS
  Filled 2022-01-14 (×6): qty 12.5

## 2022-01-14 MED ORDER — METOPROLOL TARTRATE 50 MG PO TABS
50.0000 mg | ORAL_TABLET | Freq: Two times a day (BID) | ORAL | Status: AC
  Administered 2022-01-14 – 2022-01-15 (×4): 50 mg via ORAL
  Filled 2022-01-14 (×4): qty 1

## 2022-01-14 NOTE — Progress Notes (Signed)
PROGRESS NOTE  David Pruitt PCH:[id number] DOB: 1945/09/12 DOA: 01/12/2022 PCP: Sharilyn Sites, MD  Brief History:  77 year old male with a history of CKD, diabetes mellitus type 2, hypertension, hyperlipidemia, stroke presenting with 1 to 2-day history of generalized weakness, chest discomfort, shortness of breath, and coughing yellow sputum.  He started feeling bad on 01/10/2022, but symptoms significantly worsened on 01/11/2022 when he began developing nausea, vomiting, and diarrhea.  The patient has chronic loose stools for which he takes Imodium.  He states that he takes Imodium 2-3 times per week, last dose on 01/10/2022.  He had some fevers and chills and cold sweats on the evening of 01/11/2022.  There is no hematemesis, hemoptysis, hematochezia, melena.  He denies any dysuria or hematuria.  He states that his chest discomfort is primarily right-sided and pleuritic in nature.  On 01/12/2022, he had worsening generalized weakness to the point where he had difficulty getting into the car to come to the emergency department. In the ED, the patient was afebrile and hypotensive with blood pressure 79/38.  Oxygen saturation was 99% on 2 L.  WBC 9.9, hemoglobin 14.5, platelets 227,000.  Sodium 137, potassium 4.2, bicarbonate 19, serum creatinine 2.29.  LFTs showed AST 44, ALT 38, alk phosphatase 59, total bilirubin 1.9. Chest x-ray showed chronic right greater than left lower lobe scarring.  There was associated curvilinear opacities and interstitial thickening in the left lower lobe.  The patient was started on vancomycin, ceftriaxone, and azithromycin.  He was admitted for further evaluation and treatment of his sepsis.   Assessment/Plan:  Septic shock - resolved -Presented with tachycardia, hypotension, lactic acid 6.1 -Secondary to pneumonia -started vancomycin, ceftriaxone, and azithromycin -Follow blood cultures--neg -Obtain UA--no pyuria -Check PCT 46.08>>32.32>>17.15 -reduce  rate of IV fluids -Levophed weaned off today   Lobar pneumonia -Chest x-ray suggests left lower lobe interstitial thickening and curvilinear opacities -CT chest RLL consolidation; R>L basilar interstitial thickening with GGO and bronchial wall thickening -Check PCT 46.08>>32.32 -MRSA screen--neg -d/c vanc -Continue cefepime and azithromycin -Check COVID-19 PCR and influenza--negative  Acute respiratory Failure with hypoxia -stable on 2L -wean for saturation > 92%   Acute on chronic renal failure--CKD 3a -Baseline creatinine 1.2-1.5 -Presented with serum creatinine 2.29 -Secondary to volume depletion and sepsis/hemodynamic changes -continue IVF>>imrproving   Transaminasemia -Right upper quadrant ultrasound--neg -secondary to sepsis and hypotension -improved   Lactic acidosis -Continue IV fluids -Continue to trend   Essential hypertension -Initially held metoprolol and lisinopril secondary to hypotension -pt has rebound tachycardia from holding BB -restarted metoprolol 50 mg BID 1/3, hopefully can resume toprol XL on 01/15/22   Diabetes mellitus type 2 -Holding Ozempic, metformin and glipizide -NovoLog sliding scale -Check hemoglobin A1c--6.4%   History of stroke -Continue Plavix   Family Communication:   spouse at bedside 01/14/22  Consultants:  none  Code Status:  FULL   DVT Prophylaxis:  [place] Heparin    Procedures: As Listed in Progress Note Above  Antibiotics: Cefepime 1/1>> Azithro 1/1>> Vanc 1/1>>1/2   Subjective: He says he is starting to feel better today; wants to ambulate more;   Objective: Vitals:   01/14/22 1000 01/14/22 1030 01/14/22 1100 01/14/22 1115  BP: 136/73 130/81 136/86   Pulse: (!) 122 (!) 120 (!) 115   Resp: (!) 29 (!) 26 (!) 24   Temp:    98.3 F (36.8 C)  TempSrc:    Oral  SpO2: 94% 97% 98%  Weight:      Height:        Intake/Output Summary (Last 24 hours) at 01/14/2022 1123 Last data filed at 01/14/2022 0914 Gross per 24  hour  Intake 3221.23 ml  Output 2675 ml  Net 546.23 ml   Weight change: 0.938 kg Exam:  General:  Pt is alert, follows commands appropriately, not in acute distress HEENT: No icterus, No thrush, No neck mass, [place]/AT Cardiovascular: tachycardic rate; S1/S2, no rubs, no gallops Respiratory: diminished BS.  Bilateral rales Abdomen: Soft/+BS, non tender, non distended, no guarding Extremities: No edema, No lymphangitis, No petechiae, No rashes, no synovitis  Data Reviewed: I have personally reviewed following labs and imaging studies Basic Metabolic Panel: Recent Labs  Lab 01/12/22 1147 01/13/22 0451 01/14/22 0502  NA 137 139  --   K 4.2 4.3  --   CL 103 108  --   CO2 19* 21*  --   GLUCOSE 259* 158*  --   BUN 34* 32*  --   CREATININE 2.29* 1.49* 1.03  CALCIUM 9.2 8.3*  --    Liver Function Tests: Recent Labs  Lab 01/12/22 1147 01/13/22 0451  AST 44* 25  ALT 38 29  ALKPHOS 59 47  BILITOT 1.9* 0.7  PROT 6.8 5.8*  ALBUMIN 3.8 3.0*   Recent Labs  Lab 01/12/22 1604  LIPASE 25   No results for input(s): "AMMONIA" in the last 168 hours. Coagulation Profile: Recent Labs  Lab 01/12/22 1147  INR 1.1   CBC: Recent Labs  Lab 01/12/22 1150 01/13/22 0451  WBC 9.9 18.2*  NEUTROABS 8.7*  --   HGB 14.5 12.1*  HCT 44.0 36.1*  MCV 97.6 97.0  PLT 227 179   Cardiac Enzymes: No results for input(s): "CKTOTAL", "CKMB", "CKMBINDEX", "TROPONINI" in the last 168 hours. BNP: Invalid input(s): "POCBNP" CBG: Recent Labs  Lab 01/13/22 0704 01/13/22 1108 01/13/22 1558 01/13/22 1945 01/14/22 0738  GLUCAP 123* 159* 129* 151* 117*   HbA1C: Recent Labs    01/12/22 1604  HGBA1C 6.4*   Urine analysis:    Component Value Date/Time   COLORURINE YELLOW 01/12/2022 1953   APPEARANCEUR HAZY (A) 01/12/2022 1953   APPEARANCEUR Clear 10/09/2020 1438   LABSPEC 1.021 01/12/2022 1953   PHURINE 5.0 01/12/2022 1953   GLUCOSEU NEGATIVE 01/12/2022 1953   HGBUR NEGATIVE  01/12/2022 1953   BILIRUBINUR NEGATIVE 01/12/2022 1953   BILIRUBINUR Negative 10/09/2020 1438   [place] NEGATIVE 01/12/2022 1953   PROTEINUR 30 (A) 01/12/2022 1953   NITRITE NEGATIVE 01/12/2022 1953   LEUKOCYTESUR NEGATIVE 01/12/2022 1953   Sepsis Labs: _0 (procalcitonin:4,lacticidven:4) ) Recent Results (from the past 240 hour(s))  Culture, blood (Routine x 2)     Status: None (Preliminary result)   Collection Time: 01/12/22 11:50 AM   Specimen: BLOOD  Result Value Ref Range Status   Specimen Description BLOOD LEFT ANTECUBITAL  Final   Special Requests   Final    BOTTLES DRAWN AEROBIC AND ANAEROBIC Blood Culture adequate volume   Culture   Final    NO GROWTH < 24 HOURS Performed at [facility], [address] [place], [place] [address]    Report Status PENDING  Incomplete  Culture, blood (Routine x 2)     Status: None (Preliminary result)   Collection Time: 01/12/22 11:55 AM   Specimen: BLOOD  Result Value Ref Range Status   Specimen Description BLOOD BLOOD RIGHT FOREARM  Final   Special Requests   Final    BOTTLES DRAWN AEROBIC  AND ANAEROBIC Blood Culture adequate volume   Culture   Final    NO GROWTH < 24 HOURS Performed at [facility], [address], [place], [place] [address]    Report Status PENDING  Incomplete  Resp panel by RT-PCR (RSV, Flu A&B, Covid) Anterior Nasal Swab     Status: None   Collection Time: 01/12/22 12:09 PM   Specimen: Anterior Nasal Swab  Result Value Ref Range Status   SARS Coronavirus 2 by RT PCR NEGATIVE NEGATIVE Final    Comment: (NOTE) SARS-CoV-2 target nucleic acids are NOT DETECTED.  The SARS-CoV-2 RNA is generally detectable in upper respiratory specimens during the acute phase of infection. The lowest concentration of SARS-CoV-2 viral copies this assay can detect is 138 copies/mL. A negative result does not preclude SARS-Cov-2 infection and should not be used as the sole basis for treatment or other patient management  decisions. A negative result may occur with  improper specimen collection/handling, submission of specimen other than nasopharyngeal swab, presence of viral mutation(s) within the areas targeted by this assay, and inadequate number of viral copies(<138 copies/mL). A negative result must be combined with clinical observations, patient history, and epidemiological information. The expected result is Negative.  Fact Sheet for Patients:  EntrepreneurPulse.com.au  Fact Sheet for Healthcare Providers:  IncredibleEmployment.be  This test is no t yet approved or cleared by the [place] FDA and  has been authorized for detection and/or diagnosis of SARS-CoV-2 by FDA under an Emergency Use Authorization (EUA). This EUA will remain  in effect (meaning this test can be used) for the duration of the COVID-19 declaration under Section 564(b)(1) of the Act, 21 U.S.C.section 360bbb-3(b)(1), unless the authorization is terminated  or revoked sooner.       Influenza A by PCR NEGATIVE NEGATIVE Final   Influenza B by PCR NEGATIVE NEGATIVE Final    Comment: (NOTE) The Xpert Xpress SARS-CoV-2/FLU/RSV plus assay is intended as an aid in the diagnosis of influenza from Nasopharyngeal swab specimens and should not be used as a sole basis for treatment. Nasal washings and aspirates are unacceptable for Xpert Xpress SARS-CoV-2/FLU/RSV testing.  Fact Sheet for Patients: EntrepreneurPulse.com.au  Fact Sheet for Healthcare Providers: IncredibleEmployment.be  This test is not yet approved or cleared by the [place] FDA and has been authorized for detection and/or diagnosis of SARS-CoV-2 by FDA under an Emergency Use Authorization (EUA). This EUA will remain in effect (meaning this test can be used) for the duration of the COVID-19 declaration under Section 564(b)(1) of the Act, 21 U.S.C. section 360bbb-3(b)(1), unless the  authorization is terminated or revoked.     Resp Syncytial Virus by PCR NEGATIVE NEGATIVE Final    Comment: (NOTE) Fact Sheet for Patients: EntrepreneurPulse.com.au  Fact Sheet for Healthcare Providers: IncredibleEmployment.be  This test is not yet approved or cleared by the [place] FDA and has been authorized for detection and/or diagnosis of SARS-CoV-2 by FDA under an Emergency Use Authorization (EUA). This EUA will remain in effect (meaning this test can be used) for the duration of the COVID-19 declaration under Section 564(b)(1) of the Act, 21 U.S.C. section 360bbb-3(b)(1), unless the authorization is terminated or revoked.  Performed at [facility], [address] [place], [place] [address]   MRSA Next Gen by PCR, Nasal     Status: None   Collection Time: 01/12/22  3:05 PM   Specimen: Nasal Mucosa; Nasal Swab  Result Value Ref Range Status   MRSA by PCR Next Gen NOT DETECTED  NOT DETECTED Final    Comment: (NOTE) The GeneXpert MRSA Assay (FDA approved for NASAL specimens only), is one component of a comprehensive MRSA colonization surveillance program. It is not intended to diagnose MRSA infection nor to guide or monitor treatment for MRSA infections. Test performance is not FDA approved in patients less than 52 years old. Performed at [facility], [address] [place], [place] [address]   Urine Culture     Status: None   Collection Time: 01/12/22  7:53 PM   Specimen: Urine, Clean Catch  Result Value Ref Range Status   Specimen Description   Final    URINE, CLEAN CATCH Performed at [facility], [address] [place], [place] [address]    Special Requests   Final    NONE Performed at [facility], [address] [place], [place] [address]    Culture   Final    NO GROWTH Performed at [facility], [place] [address], [place], [place] [address]    Report Status 01/14/2022 FINAL  Final     Scheduled Meds:   Chlorhexidine Gluconate Cloth  6 each Topical Daily   clopidogrel  75 mg Oral QHS   cyanocobalamin  1,000 mcg Oral Daily   gabapentin  100 mg Oral QHS   heparin  5,000 Units Subcutaneous Q8H   insulin aspart  0-9 Units Subcutaneous TID WC   loratadine  10 mg Oral QHS   metoprolol tartrate  50 mg Oral BID   pantoprazole  40 mg Oral Daily   Continuous Infusions:  sodium chloride     azithromycin Stopped (01/13/22 1301)   ceFEPime (MAXIPIME) IV     lactated ringers 50 mL/hr at 01/14/22 1107   norepinephrine (LEVOPHED) Adult infusion Stopped (01/13/22 1710)    Procedures/Studies: US RENAL  Result Date: 01/14/2022 CLINICAL DATA:  Left renal mass EXAM: RENAL / URINARY TRACT ULTRASOUND COMPLETE COMPARISON:  CT 12/02/2018. FINDINGS: Right Kidney: Renal measurements: 11.4 x 6.7 x 5.3 cm = volume: 214.8 mL. Echogenicity within normal limits. No mass or hydronephrosis visualized. Left Kidney: Renal measurements: 11.0 x 6.0 x 5.2 cm = volume: 180.0 mL. No hydronephrosis. Echogenicity within normal limits. Cystic lesion in the left upper pole measuring 1.8 x 1.5 x 2.0 cm, appears simple. No other renal mass identified on limited views (prior CT noted 2 lesions). Bladder: Appears normal for degree of bladder distention. Other: None. IMPRESSION: Cystic lesion in the left upper pole measures 1.8 x 1.5 x 2.0 cm, appears to be a simple cyst on limited views. No other renal mass identified (prior CT noted 2 lesions). Non-emergent contrast enhanced renal protocol CT or MRI as an outpatient is recommended for further evaluation. Electronically Signed   By: Maurine Simmering M.D.   On: 01/14/2022 09:34   US Abdomen Limited RUQ (LIVER/GB)  Result Date: 01/13/2022 CLINICAL DATA:  77 year old male with elevated LFTs. EXAM: ULTRASOUND ABDOMEN LIMITED RIGHT UPPER QUADRANT COMPARISON:  12/02/2018 CT FINDINGS: Gallbladder: The gallbladder is unremarkable. There is no evidence of cholelithiasis or acute cholecystitis. Common  bile duct: Diameter: 4 mm. There is no evidence of intrahepatic or extrahepatic biliary dilatation. Liver: No focal lesion identified. Within normal limits in parenchymal echogenicity. Portal vein is patent on color Doppler imaging with normal direction of blood flow towards the liver. Other: None. IMPRESSION: Unremarkable RIGHT UPPER quadrant abdominal ultrasound. Electronically Signed   By: Margarette Canada M.D.   On: 01/13/2022 09:26   CT CHEST WO CONTRAST  Result Date: 01/12/2022  CLINICAL DATA:  Respiratory illness, nondiagnostic x-ray. Shortness of breath and generalized weakness EXAM: CT CHEST WITHOUT CONTRAST TECHNIQUE: Multidetector CT imaging of the chest was performed following the standard protocol without IV contrast. RADIATION DOSE REDUCTION: This exam was performed according to the departmental dose-optimization program which includes automated exposure control, adjustment of the mA and/or kV according to patient size and/or use of iterative reconstruction technique. COMPARISON:  Chest CT April 15, 2020. FINDINGS: Cardiovascular: Aortic and branch vessel atherosclerosis. Enlarged main pulmonary artery measuring 3.2 cm. Coronary artery calcifications. Normal size heart. No significant pericardial effusion/thickening. Mediastinum/Nodes: No suspicious thyroid nodule. No pathologically enlarged mediastinal, hilar or axillary lymph nodes, noting limited sensitivity for the detection of hilar adenopathy on this noncontrast study. Small hiatal hernia. Lungs/Pleura: Right lower lobe consolidation with right-greater-than-left basilar interstitial thickening and interposed ground-glass opacities and bronchial wall thickening. Similar left pleural thickening with linear bands of scarring/architectural distortion. Stable calcified subpleural pulmonary nodules in the left lower lobe. Evaluation of the noncalcified pulmonary nodule seen on prior CTs areas limited by acute inflammatory process. Similar biapical  pleuroparenchymal scarring. Paraseptal and centrilobular emphysema. Upper Abdomen: Nonobstructive left upper pole renal stone. Left upper pole renal lesion measures 15 mm at Hounsfield units of 22, greater than that expected for a simple cyst. Additional 18 mm hypodense left upper pole renal lesion measures Hounsfield units of 7 compatible with a benign cysts. Musculoskeletal: Multilevel degenerative changes spine. IMPRESSION: 1. Right lower lobe consolidation with right-greater-than-left basilar interstitial thickening and interposed ground-glass opacities and bronchial wall thickening, most consistent with an infectious or inflammatory process. Suggest follow-up CT in 2-3 months to ensure resolution. 2. Enlarged main pulmonary artery, which can be seen in the setting of pulmonary arterial hypertension. 3. Left upper pole renal lesion measures 15 mm at Hounsfield units of 22, greater than that expected for a simple cyst. Recommend further evaluation with nonemergent renal ultrasound. 4. Nonobstructive left upper pole renal stone. 5.  Aortic Atherosclerosis (ICD10-I70.0). Electronically Signed   By: Dahlia Bailiff M.D.   On: 01/12/2022 20:11   DG Chest Port 1 View  Result Date: 01/12/2022 CLINICAL DATA:  Shortness of breath. EXAM: PORTABLE CHEST 1 VIEW COMPARISON:  Chest and left rib radiographs 06/17/2021, CT chest 04/15/2020 FINDINGS: Cardiac silhouette is upper limits of normal size. Mediastinal contours are within normal limits. On prior 04/15/2020 CT, left-greater-than-right lower lung curvilinear scarring was seen. Associated curvilinear opacities and interstitial thickening on the current study appear slightly increased compared to 06/17/2021 frontal chest radiograph. Increased lucencies and attenuation of the superior pulmonary vasculature corresponding to chronic emphysematous changes seen on prior CT. No pleural effusion pneumothorax. No acute skeletal abnormality. IMPRESSION: Chronic  left-greater-than-right lower lung scarring. The associated curvilinear opacities appear slightly thickened compared to 06/17/2021 prior radiographs, however otherwise no focal airspace opacity is seen. This may be due to increased overlying atelectasis. It is difficult to completely exclude left basilar pneumonia. Electronically Signed   By: Yvonne Kendall M.D.   On: 01/12/2022 12:49    Critical Care Procedure Note Authorized and Performed by: Murvin Natal MD  Total Critical Care time:  48 mins Due to a high probability of clinically significant, life threatening deterioration, the patient required my highest level of preparedness to intervene emergently and I personally spent this critical care time directly and personally managing the patient.  This critical care time included obtaining a history; examining the patient, pulse oximetry; ordering and review of studies; arranging urgent treatment with development of a management plan;  evaluation of patient's response of treatment; frequent reassessment; and discussions with other providers.  This critical care time was performed to assess and manage the high probability of imminent and life threatening deterioration that could result in multi-organ failure.  It was exclusive of separately billable procedures and treating other patients and teaching time.   Irwin Brakeman, MD [facility]  If 7PM-7AM, please contact night-coverage [email or web address] Password [email or web address] 01/14/2022, 11:23 AM   LOS: 2 days

## 2022-01-14 NOTE — Progress Notes (Signed)
PHARMACY NOTE:  ANTIMICROBIAL RENAL DOSAGE ADJUSTMENT  Current antimicrobial regimen includes a mismatch between antimicrobial dosage and estimated renal function.  As per policy approved by the Pharmacy & Therapeutics and Medical Executive Committees, the antimicrobial dosage will be adjusted accordingly.  Current antimicrobial dosage:  cefepime 2000 mg IV every 12 hours  Indication: sepsis/pneumonia  Renal Function:  Estimated Creatinine Clearance: 65 mL/min (by C-G formula based on SCr of 1.03 mg/dL). '[]'$      On intermittent HD, scheduled: '[]'$      On CRRT    Antimicrobial dosage has been changed to:  cefepime 2000 mg IV every 8 hours.  Additional comments:   Thank you for allowing pharmacy to be a part of this patient's care.  Ramond Craver, [facility] 01/14/2022 9:08 AM

## 2022-01-15 DIAGNOSIS — E872 Acidosis, unspecified: Secondary | ICD-10-CM | POA: Diagnosis not present

## 2022-01-15 DIAGNOSIS — A419 Sepsis, unspecified organism: Secondary | ICD-10-CM | POA: Diagnosis not present

## 2022-01-15 DIAGNOSIS — J181 Lobar pneumonia, unspecified organism: Secondary | ICD-10-CM | POA: Diagnosis not present

## 2022-01-15 DIAGNOSIS — N179 Acute kidney failure, unspecified: Secondary | ICD-10-CM | POA: Diagnosis not present

## 2022-01-15 LAB — CBC WITH DIFFERENTIAL/PLATELET
Abs Immature Granulocytes: 0.11 10*3/uL — ABNORMAL HIGH (ref 0.00–0.07)
Basophils Absolute: 0.1 10*3/uL (ref 0.0–0.1)
Basophils Relative: 0 %
Eosinophils Absolute: 0.3 10*3/uL (ref 0.0–0.5)
Eosinophils Relative: 2 %
HCT: 35.3 % — ABNORMAL LOW (ref 39.0–52.0)
Hemoglobin: 11.7 g/dL — ABNORMAL LOW (ref 13.0–17.0)
Immature Granulocytes: 1 %
Lymphocytes Relative: 8 %
Lymphs Abs: 1.2 10*3/uL (ref 0.7–4.0)
MCH: 31.8 pg (ref 26.0–34.0)
MCHC: 33.1 g/dL (ref 30.0–36.0)
MCV: 95.9 fL (ref 80.0–100.0)
Monocytes Absolute: 0.9 10*3/uL (ref 0.1–1.0)
Monocytes Relative: 6 %
Neutro Abs: 12.4 10*3/uL — ABNORMAL HIGH (ref 1.7–7.7)
Neutrophils Relative %: 83 %
Platelets: 172 10*3/uL (ref 150–400)
RBC: 3.68 MIL/uL — ABNORMAL LOW (ref 4.22–5.81)
RDW: 13.3 % (ref 11.5–15.5)
WBC: 14.8 10*3/uL — ABNORMAL HIGH (ref 4.0–10.5)
nRBC: 0 % (ref 0.0–0.2)

## 2022-01-15 LAB — MAGNESIUM: Magnesium: 1.7 mg/dL (ref 1.7–2.4)

## 2022-01-15 LAB — BASIC METABOLIC PANEL
Anion gap: 8 (ref 5–15)
BUN: 19 mg/dL (ref 8–23)
CO2: 24 mmol/L (ref 22–32)
Calcium: 8.9 mg/dL (ref 8.9–10.3)
Chloride: 107 mmol/L (ref 98–111)
Creatinine, Ser: 1.11 mg/dL (ref 0.61–1.24)
GFR, Estimated: >60 mL/min (ref >60)
Glucose, Bld: 147 mg/dL — ABNORMAL HIGH (ref 70–99)
Potassium: 3.4 mmol/L — ABNORMAL LOW (ref 3.5–5.1)
Sodium: 139 mmol/L (ref 135–145)

## 2022-01-15 LAB — GLUCOSE, CAPILLARY
Glucose-Capillary: 137 mg/dL — ABNORMAL HIGH (ref 70–99)
Glucose-Capillary: 201 mg/dL — ABNORMAL HIGH (ref 70–99)
Glucose-Capillary: 155 mg/dL — ABNORMAL HIGH (ref 70–99)
Glucose-Capillary: 165 mg/dL — ABNORMAL HIGH (ref 70–99)

## 2022-01-15 MED ORDER — METOPROLOL SUCCINATE ER 50 MG PO TB24
50.0000 mg | ORAL_TABLET | Freq: Every day | ORAL | Status: DC
  Administered 2022-01-16: 50 mg via ORAL
  Filled 2022-01-15: qty 1

## 2022-01-15 MED ORDER — POTASSIUM CHLORIDE CRYS ER 20 MEQ PO TBCR
40.0000 meq | EXTENDED_RELEASE_TABLET | Freq: Once | ORAL | Status: AC
  Administered 2022-01-15: 40 meq via ORAL
  Filled 2022-01-15: qty 2

## 2022-01-15 MED ORDER — SACCHAROMYCES BOULARDII 250 MG PO CAPS
250.0000 mg | ORAL_CAPSULE | Freq: Two times a day (BID) | ORAL | Status: DC
  Administered 2022-01-15 – 2022-01-16 (×3): 250 mg via ORAL
  Filled 2022-01-15 (×3): qty 1

## 2022-01-15 MED ORDER — MAGNESIUM SULFATE 2 GM/50ML IV SOLN
2.0000 g | Freq: Once | INTRAVENOUS | Status: AC
  Administered 2022-01-15: 2 g via INTRAVENOUS
  Filled 2022-01-15: qty 50

## 2022-01-15 MED ORDER — DEXTROMETHORPHAN POLISTIREX ER 30 MG/5ML PO SUER: 30.0000 mg | Freq: Two times a day (BID) | ORAL | Status: DC | PRN

## 2022-01-15 MED ORDER — GUAIFENESIN ER 600 MG PO TB12
600.0000 mg | ORAL_TABLET | Freq: Two times a day (BID) | ORAL | Status: DC
  Administered 2022-01-15 – 2022-01-16 (×3): 600 mg via ORAL
  Filled 2022-01-15 (×3): qty 1

## 2022-01-15 NOTE — Progress Notes (Addendum)
PROGRESS NOTE  David Pruitt GEX:[id number] DOB: 07-18-1945 DOA: 01/12/2022 PCP: Sharilyn Sites, MD  Brief History:  77 year old male with a history of CKD, diabetes mellitus type 2, hypertension, hyperlipidemia, stroke presenting with 1 to 2-day history of generalized weakness, chest discomfort, shortness of breath, and coughing yellow sputum.  He started feeling bad on 01/10/2022, but symptoms significantly worsened on 01/11/2022 when he began developing nausea, vomiting, and diarrhea.  The patient has chronic loose stools for which he takes Imodium.  He states that he takes Imodium 2-3 times per week, last dose on 01/10/2022.  He had some fevers and chills and cold sweats on the evening of 01/11/2022.  There is no hematemesis, hemoptysis, hematochezia, melena.  He denies any dysuria or hematuria.  He states that his chest discomfort is primarily right-sided and pleuritic in nature.  On 01/12/2022, he had worsening generalized weakness to the point where he had difficulty getting into the car to come to the emergency department. In the ED, the patient was afebrile and hypotensive with blood pressure 79/38.  Oxygen saturation was 99% on 2 L.  WBC 9.9, hemoglobin 14.5, platelets 227,000.  Sodium 137, potassium 4.2, bicarbonate 19, serum creatinine 2.29.  LFTs showed AST 44, ALT 38, alk phosphatase 59, total bilirubin 1.9. Chest x-ray showed chronic right greater than left lower lobe scarring.  There was associated curvilinear opacities and interstitial thickening in the left lower lobe.  The patient was started on vancomycin, ceftriaxone, and azithromycin.  He was admitted for further evaluation and treatment of his sepsis.   Assessment/Plan:  Septic shock - resolved -Presented with tachycardia, hypotension, lactic acid 6.1 -Secondary to pneumonia -started vancomycin, ceftriaxone, and azithromycin -Follow blood cultures--neg -Obtain UA--no pyuria -Check PCT 46.08>>32.32>>17.15 -reduce rate of  IV fluids -Levophed weaned off 01/14/22   Lobar pneumonia -Chest x-ray suggests left lower lobe interstitial thickening and curvilinear opacities -CT chest RLL consolidation; R>L basilar interstitial thickening with GGO and bronchial wall thickening -Check PCT 46.08>>32.32 -MRSA screen--neg -d/c vanc -Continue cefepime and azithromycin -Check COVID-19 PCR and influenza--negative  Acute respiratory Failure with hypoxia -stable on 2L -wean for saturation > 92% -weaned to room air   Acute on chronic renal failure--CKD 3a -Baseline creatinine 1.2-1.5 -Presented with serum creatinine 2.29 -Secondary to volume depletion and sepsis/hemodynamic changes -continue IVF>>improving   Transaminasemia -Right upper quadrant ultrasound--neg -secondary to sepsis and hypotension -improved   Lactic acidosis -Continue IV fluids -resolved with hydration   Essential hypertension -Initially held metoprolol and lisinopril secondary to hypotension -pt has rebound tachycardia from holding BB -restarted metoprolol 50 mg BID 1/3, hopefully can resume toprol XL prior to discharge   Diabetes mellitus type 2 -Holding Ozempic, metformin and glipizide -NovoLog sliding scale -Check hemoglobin A1c--6.4%  CBG (last 3)  Recent Labs    01/14/22 1614 01/14/22 2330 01/15/22 0725  GLUCAP 156* 144* 137*    History of stroke -Continue Plavix   Family Communication:   spouse at bedside 01/14/22  Consultants:  none  Code Status:  FULL   DVT Prophylaxis:  [place] Heparin    Procedures: As Listed in Progress Note Above  Antibiotics: Cefepime 1/1>> Azithro 1/1>> Vanc 1/1>>1/2   Subjective: He is reporting that overall he is feeling better today.  He has a nonproductive cough.     Objective: Vitals:   01/15/22 0724 01/15/22 0800 01/15/22 0900 01/15/22 1000  BP:  (!) 145/83 (!) 159/92 119/63  Pulse: 96 95 (!) 105 93  Resp: (!) 28 (!)  25 16 (!) 29  Temp: 97.7 F (36.5 C)     TempSrc: Oral      SpO2: 93% 94% 93% 91%  Weight:      Height:        Intake/Output Summary (Last 24 hours) at 01/15/2022 1112 Last data filed at 01/15/2022 1034 Gross per 24 hour  Intake 1528.94 ml  Output 3620 ml  Net -2091.06 ml   Weight change: -1.1 kg Exam:  General:  Pt is alert, follows commands appropriately, not in acute distress HEENT: No icterus, No thrush, No neck mass, [place]/AT Cardiovascular: tachycardic rate; S1/S2, no rubs, no gallops Respiratory: diminished BS.  Bilateral rales Abdomen: Soft/+BS, non tender, non distended, no guarding Extremities: No edema, No lymphangitis, No petechiae, No rashes, no synovitis  Data Reviewed: I have personally reviewed following labs and imaging studies Basic Metabolic Panel: Recent Labs  Lab 01/12/22 1147 01/13/22 0451 01/14/22 0502 01/15/22 0459  NA 137 139  --  139  K 4.2 4.3  --  3.4*  CL 103 108  --  107  CO2 19* 21*  --  24  GLUCOSE 259* 158*  --  147*  BUN 34* 32*  --  19  CREATININE 2.29* 1.49* 1.03 1.11  CALCIUM 9.2 8.3*  --  8.9  MG  --   --   --  1.7   Liver Function Tests: Recent Labs  Lab 01/12/22 1147 01/13/22 0451  AST 44* 25  ALT 38 29  ALKPHOS 59 47  BILITOT 1.9* 0.7  PROT 6.8 5.8*  ALBUMIN 3.8 3.0*   Recent Labs  Lab 01/12/22 1604  LIPASE 25   No results for input(s): "AMMONIA" in the last 168 hours. Coagulation Profile: Recent Labs  Lab 01/12/22 1147  INR 1.1   CBC: Recent Labs  Lab 01/12/22 1150 01/13/22 0451 01/15/22 0459  WBC 9.9 18.2* 14.8*  NEUTROABS 8.7*  --  12.4*  HGB 14.5 12.1* 11.7*  HCT 44.0 36.1* 35.3*  MCV 97.6 97.0 95.9  PLT 227 179 172   Cardiac Enzymes: No results for input(s): "CKTOTAL", "CKMB", "CKMBINDEX", "TROPONINI" in the last 168 hours. BNP: Invalid input(s): "POCBNP" CBG: Recent Labs  Lab 01/14/22 0738 01/14/22 1114 01/14/22 1614 01/14/22 2330 01/15/22 0725  GLUCAP 117* 178* 156* 144* 137*   HbA1C: Recent Labs    01/12/22 1604  HGBA1C 6.4*   Urine  analysis:    Component Value Date/Time   COLORURINE YELLOW 01/12/2022 1953   APPEARANCEUR HAZY (A) 01/12/2022 1953   APPEARANCEUR Clear 10/09/2020 1438   LABSPEC 1.021 01/12/2022 1953   PHURINE 5.0 01/12/2022 1953   GLUCOSEU NEGATIVE 01/12/2022 1953   HGBUR NEGATIVE 01/12/2022 1953   BILIRUBINUR NEGATIVE 01/12/2022 1953   BILIRUBINUR Negative 10/09/2020 1438   [place] NEGATIVE 01/12/2022 1953   PROTEINUR 30 (A) 01/12/2022 1953   NITRITE NEGATIVE 01/12/2022 1953   LEUKOCYTESUR NEGATIVE 01/12/2022 1953   Recent Results (from the past 240 hour(s))  Culture, blood (Routine x 2)     Status: None (Preliminary result)   Collection Time: 01/12/22 11:50 AM   Specimen: BLOOD  Result Value Ref Range Status   Specimen Description   Final    BLOOD LEFT ANTECUBITAL Performed at [facility], [address], [place], [place] [address]    Special Requests   Final    BOTTLES DRAWN AEROBIC AND ANAEROBIC Blood Culture adequate volume Performed at [facility], [address] [place],  [address]    Culture   Final  NO GROWTH 2 DAYS Performed at [facility], [place] [address], [place], [place] [address]    Report Status PENDING  Incomplete  Culture, blood (Routine x 2)     Status: None (Preliminary result)   Collection Time: 01/12/22 11:55 AM   Specimen: BLOOD  Result Value Ref Range Status   Specimen Description   Final    BLOOD BLOOD RIGHT FOREARM Performed at [facility], [address] [place], [place] [address]    Special Requests   Final    BOTTLES DRAWN AEROBIC AND ANAEROBIC Blood Culture adequate volume Performed at [facility], [address], [place], [place] [address]    Culture   Final    NO GROWTH 2 DAYS Performed at [facility], [place] [address], [place], [place] [address]    Report Status PENDING  Incomplete  Resp panel by RT-PCR (RSV, Flu A&B, Covid) Anterior Nasal Swab     Status: None   Collection Time: 01/12/22 12:09 PM   Specimen: Anterior  Nasal Swab  Result Value Ref Range Status   SARS Coronavirus 2 by RT PCR NEGATIVE NEGATIVE Final    Comment: (NOTE) SARS-CoV-2 target nucleic acids are NOT DETECTED.  The SARS-CoV-2 RNA is generally detectable in upper respiratory specimens during the acute phase of infection. The lowest concentration of SARS-CoV-2 viral copies this assay can detect is 138 copies/mL. A negative result does not preclude SARS-Cov-2 infection and should not be used as the sole basis for treatment or other patient management decisions. A negative result may occur with  improper specimen collection/handling, submission of specimen other than nasopharyngeal swab, presence of viral mutation(s) within the areas targeted by this assay, and inadequate number of viral copies(<138 copies/mL). A negative result must be combined with clinical observations, patient history, and epidemiological information. The expected result is Negative.  Fact Sheet for Patients:  EntrepreneurPulse.com.au  Fact Sheet for Healthcare Providers:  IncredibleEmployment.be  This test is no t yet approved or cleared by the [place] FDA and  has been authorized for detection and/or diagnosis of SARS-CoV-2 by FDA under an Emergency Use Authorization (EUA). This EUA will remain  in effect (meaning this test can be used) for the duration of the COVID-19 declaration under Section 564(b)(1) of the Act, 21 U.S.C.section 360bbb-3(b)(1), unless the authorization is terminated  or revoked sooner.       Influenza A by PCR NEGATIVE NEGATIVE Final   Influenza B by PCR NEGATIVE NEGATIVE Final    Comment: (NOTE) The Xpert Xpress SARS-CoV-2/FLU/RSV plus assay is intended as an aid in the diagnosis of influenza from Nasopharyngeal swab specimens and should not be used as a sole basis for treatment. Nasal washings and aspirates are unacceptable for Xpert Xpress SARS-CoV-2/FLU/RSV testing.  Fact Sheet for  Patients: EntrepreneurPulse.com.au  Fact Sheet for Healthcare Providers: IncredibleEmployment.be  This test is not yet approved or cleared by the [place] FDA and has been authorized for detection and/or diagnosis of SARS-CoV-2 by FDA under an Emergency Use Authorization (EUA). This EUA will remain in effect (meaning this test can be used) for the duration of the COVID-19 declaration under Section 564(b)(1) of the Act, 21 U.S.C. section 360bbb-3(b)(1), unless the authorization is terminated or revoked.     Resp Syncytial Virus by PCR NEGATIVE NEGATIVE Final    Comment: (NOTE) Fact Sheet for Patients: EntrepreneurPulse.com.au  Fact Sheet for Healthcare Providers: IncredibleEmployment.be  This test is not yet approved or cleared by the [place] FDA and has  been authorized for detection and/or diagnosis of SARS-CoV-2 by FDA under an Emergency Use Authorization (EUA). This EUA will remain in effect (meaning this test can be used) for the duration of the COVID-19 declaration under Section 564(b)(1) of the Act, 21 U.S.C. section 360bbb-3(b)(1), unless the authorization is terminated or revoked.  Performed at [facility], [address], [place], [place] [address]   MRSA Next Gen by PCR, Nasal     Status: None   Collection Time: 01/12/22  3:05 PM   Specimen: Nasal Mucosa; Nasal Swab  Result Value Ref Range Status   MRSA by PCR Next Gen NOT DETECTED NOT DETECTED Final    Comment: (NOTE) The GeneXpert MRSA Assay (FDA approved for NASAL specimens only), is one component of a comprehensive MRSA colonization surveillance program. It is not intended to diagnose MRSA infection nor to guide or monitor treatment for MRSA infections. Test performance is not FDA approved in patients less than 72 years old. Performed at [facility], [address], [place], [place] [address]   Urine Culture     Status: None    Collection Time: 01/12/22  7:53 PM   Specimen: Urine, Clean Catch  Result Value Ref Range Status   Specimen Description   Final    URINE, CLEAN CATCH Performed at [facility], [address], [place], [place] [address]    Special Requests   Final    NONE Performed at [facility], [address] [place], [place] [address]    Culture   Final    NO GROWTH Performed at [facility], [place] [address] [place], [place] [address]    Report Status 01/14/2022 FINAL  Final     Scheduled Meds:  Chlorhexidine Gluconate Cloth  6 each Topical Daily   clopidogrel  75 mg Oral QHS   cyanocobalamin  1,000 mcg Oral Daily   gabapentin  100 mg Oral QHS   guaiFENesin  600 mg Oral BID   heparin  5,000 Units Subcutaneous Q8H   insulin aspart  0-9 Units Subcutaneous TID WC   loratadine  10 mg Oral QHS   metoprolol tartrate  50 mg Oral BID   pantoprazole  40 mg Oral Daily   saccharomyces boulardii  250 mg Oral BID   Continuous Infusions:  sodium chloride     azithromycin Stopped (01/14/22 1406)   ceFEPime (MAXIPIME) IV 2 g (01/15/22 0806)   lactated ringers 50 mL/hr at 01/15/22 0545   norepinephrine (LEVOPHED) Adult infusion Stopped (01/13/22 1710)    Procedures/Studies: US RENAL  Result Date: 01/14/2022 CLINICAL DATA:  Left renal mass EXAM: RENAL / URINARY TRACT ULTRASOUND COMPLETE COMPARISON:  CT 12/02/2018. FINDINGS: Right Kidney: Renal measurements: 11.4 x 6.7 x 5.3 cm = volume: 214.8 mL. Echogenicity within normal limits. No mass or hydronephrosis visualized. Left Kidney: Renal measurements: 11.0 x 6.0 x 5.2 cm = volume: 180.0 mL. No hydronephrosis. Echogenicity within normal limits. Cystic lesion in the left upper pole measuring 1.8 x 1.5 x 2.0 cm, appears simple. No other renal mass identified on limited views (prior CT noted 2 lesions). Bladder: Appears normal for degree of bladder distention. Other: None. IMPRESSION: Cystic lesion in the left upper pole measures 1.8 x 1.5 x 2.0 cm,  appears to be a simple cyst on limited views. No other renal mass identified (prior CT noted 2 lesions). Non-emergent contrast enhanced renal protocol CT or MRI as an outpatient is recommended for further evaluation. Electronically Signed   By: Ileene Patrick.D.  On: 01/14/2022 09:34   US Abdomen Limited RUQ (LIVER/GB)  Result Date: 01/13/2022 CLINICAL DATA:  77 year old male with elevated LFTs. EXAM: ULTRASOUND ABDOMEN LIMITED RIGHT UPPER QUADRANT COMPARISON:  12/02/2018 CT FINDINGS: Gallbladder: The gallbladder is unremarkable. There is no evidence of cholelithiasis or acute cholecystitis. Common bile duct: Diameter: 4 mm. There is no evidence of intrahepatic or extrahepatic biliary dilatation. Liver: No focal lesion identified. Within normal limits in parenchymal echogenicity. Portal vein is patent on color Doppler imaging with normal direction of blood flow towards the liver. Other: None. IMPRESSION: Unremarkable RIGHT UPPER quadrant abdominal ultrasound. Electronically Signed   By: Margarette Canada M.D.   On: 01/13/2022 09:26   CT CHEST WO CONTRAST  Result Date: 01/12/2022 CLINICAL DATA:  Respiratory illness, nondiagnostic x-ray. Shortness of breath and generalized weakness EXAM: CT CHEST WITHOUT CONTRAST TECHNIQUE: Multidetector CT imaging of the chest was performed following the standard protocol without IV contrast. RADIATION DOSE REDUCTION: This exam was performed according to the departmental dose-optimization program which includes automated exposure control, adjustment of the mA and/or kV according to patient size and/or use of iterative reconstruction technique. COMPARISON:  Chest CT April 15, 2020. FINDINGS: Cardiovascular: Aortic and branch vessel atherosclerosis. Enlarged main pulmonary artery measuring 3.2 cm. Coronary artery calcifications. Normal size heart. No significant pericardial effusion/thickening. Mediastinum/Nodes: No suspicious thyroid nodule. No pathologically enlarged mediastinal,  hilar or axillary lymph nodes, noting limited sensitivity for the detection of hilar adenopathy on this noncontrast study. Small hiatal hernia. Lungs/Pleura: Right lower lobe consolidation with right-greater-than-left basilar interstitial thickening and interposed ground-glass opacities and bronchial wall thickening. Similar left pleural thickening with linear bands of scarring/architectural distortion. Stable calcified subpleural pulmonary nodules in the left lower lobe. Evaluation of the noncalcified pulmonary nodule seen on prior CTs areas limited by acute inflammatory process. Similar biapical pleuroparenchymal scarring. Paraseptal and centrilobular emphysema. Upper Abdomen: Nonobstructive left upper pole renal stone. Left upper pole renal lesion measures 15 mm at Hounsfield units of 22, greater than that expected for a simple cyst. Additional 18 mm hypodense left upper pole renal lesion measures Hounsfield units of 7 compatible with a benign cysts. Musculoskeletal: Multilevel degenerative changes spine. IMPRESSION: 1. Right lower lobe consolidation with right-greater-than-left basilar interstitial thickening and interposed ground-glass opacities and bronchial wall thickening, most consistent with an infectious or inflammatory process. Suggest follow-up CT in 2-3 months to ensure resolution. 2. Enlarged main pulmonary artery, which can be seen in the setting of pulmonary arterial hypertension. 3. Left upper pole renal lesion measures 15 mm at Hounsfield units of 22, greater than that expected for a simple cyst. Recommend further evaluation with nonemergent renal ultrasound. 4. Nonobstructive left upper pole renal stone. 5.  Aortic Atherosclerosis (ICD10-I70.0). Electronically Signed   By: Dahlia Bailiff M.D.   On: 01/12/2022 20:11   DG Chest Port 1 View  Result Date: 01/12/2022 CLINICAL DATA:  Shortness of breath. EXAM: PORTABLE CHEST 1 VIEW COMPARISON:  Chest and left rib radiographs 06/17/2021, CT chest  04/15/2020 FINDINGS: Cardiac silhouette is upper limits of normal size. Mediastinal contours are within normal limits. On prior 04/15/2020 CT, left-greater-than-right lower lung curvilinear scarring was seen. Associated curvilinear opacities and interstitial thickening on the current study appear slightly increased compared to 06/17/2021 frontal chest radiograph. Increased lucencies and attenuation of the superior pulmonary vasculature corresponding to chronic emphysematous changes seen on prior CT. No pleural effusion pneumothorax. No acute skeletal abnormality. IMPRESSION: Chronic left-greater-than-right lower lung scarring. The associated curvilinear opacities appear slightly thickened compared to 06/17/2021 prior radiographs,  however otherwise no focal airspace opacity is seen. This may be due to increased overlying atelectasis. It is difficult to completely exclude left basilar pneumonia. Electronically Signed   By: Yvonne Kendall M.D.   On: 01/12/2022 12:49    Time Spent: 40 mins  Remy Dia, MD [facility]  If 7PM-7AM, please contact night-coverage [email or web address] Password [email or web address] 01/15/2022, 11:12 AM   LOS: 3 days

## 2022-01-16 DIAGNOSIS — N179 Acute kidney failure, unspecified: Secondary | ICD-10-CM | POA: Diagnosis not present

## 2022-01-16 DIAGNOSIS — A419 Sepsis, unspecified organism: Secondary | ICD-10-CM | POA: Diagnosis not present

## 2022-01-16 DIAGNOSIS — J181 Lobar pneumonia, unspecified organism: Secondary | ICD-10-CM | POA: Diagnosis not present

## 2022-01-16 DIAGNOSIS — E872 Acidosis, unspecified: Secondary | ICD-10-CM | POA: Diagnosis not present

## 2022-01-16 LAB — CBC WITH DIFFERENTIAL/PLATELET
Abs Immature Granulocytes: 0.21 10*3/uL — ABNORMAL HIGH (ref 0.00–0.07)
Basophils Absolute: 0.1 10*3/uL (ref 0.0–0.1)
Basophils Relative: 1 %
Eosinophils Absolute: 0.2 10*3/uL (ref 0.0–0.5)
Eosinophils Relative: 1 %
HCT: 36.4 % — ABNORMAL LOW (ref 39.0–52.0)
Hemoglobin: 12.2 g/dL — ABNORMAL LOW (ref 13.0–17.0)
Immature Granulocytes: 2 %
Lymphocytes Relative: 12 %
Lymphs Abs: 1.5 10*3/uL (ref 0.7–4.0)
MCH: 31.7 pg (ref 26.0–34.0)
MCHC: 33.5 g/dL (ref 30.0–36.0)
MCV: 94.5 fL (ref 80.0–100.0)
Monocytes Absolute: 1.2 10*3/uL — ABNORMAL HIGH (ref 0.1–1.0)
Monocytes Relative: 10 %
Neutro Abs: 9.4 10*3/uL — ABNORMAL HIGH (ref 1.7–7.7)
Neutrophils Relative %: 74 %
Platelets: 205 10*3/uL (ref 150–400)
RBC: 3.85 MIL/uL — ABNORMAL LOW (ref 4.22–5.81)
RDW: 13.2 % (ref 11.5–15.5)
WBC: 12.6 10*3/uL — ABNORMAL HIGH (ref 4.0–10.5)
nRBC: 0 % (ref 0.0–0.2)

## 2022-01-16 LAB — BASIC METABOLIC PANEL
Anion gap: 11 (ref 5–15)
BUN: 23 mg/dL (ref 8–23)
CO2: 22 mmol/L (ref 22–32)
Calcium: 8.9 mg/dL (ref 8.9–10.3)
Chloride: 105 mmol/L (ref 98–111)
Creatinine, Ser: 1.14 mg/dL (ref 0.61–1.24)
GFR, Estimated: >60 mL/min (ref >60)
Glucose, Bld: 163 mg/dL — ABNORMAL HIGH (ref 70–99)
Potassium: 3.8 mmol/L (ref 3.5–5.1)
Sodium: 138 mmol/L (ref 135–145)

## 2022-01-16 LAB — GLUCOSE, CAPILLARY
Glucose-Capillary: 168 mg/dL — ABNORMAL HIGH (ref 70–99)
Glucose-Capillary: 204 mg/dL — ABNORMAL HIGH (ref 70–99)

## 2022-01-16 MED ORDER — DEXTROMETHORPHAN POLISTIREX ER 30 MG/5ML PO SUER: 30.0000 mg | Freq: Two times a day (BID) | ORAL | 0 refills | Status: DC | PRN

## 2022-01-16 MED ORDER — SACCHAROMYCES BOULARDII 250 MG PO CAPS: 250.0000 mg | ORAL_CAPSULE | Freq: Two times a day (BID) | ORAL | 0 refills | Status: AC

## 2022-01-16 MED ORDER — GUAIFENESIN ER 600 MG PO TB12: 600.0000 mg | ORAL_TABLET | Freq: Two times a day (BID) | ORAL | 0 refills | Status: AC

## 2022-01-16 MED ORDER — DOXYCYCLINE HYCLATE 100 MG PO CAPS: 100.0000 mg | ORAL_CAPSULE | Freq: Two times a day (BID) | ORAL | 0 refills | Status: AC

## 2022-01-16 NOTE — Progress Notes (Addendum)
Patient did not rest well during night. PRN tylenol give at 0048 for headache (4/10 pain). Medication was effective. Patient continue to be on room air.

## 2022-01-16 NOTE — TOC Progression Note (Signed)
Transition of Care [facility]) - Progression Note    Patient Details  Name: David Pruitt MRN: [id number] Date of Birth: [birth year]  Transition of Care [facility] CM/SW Contact  Salome Arnt, [place] Phone Number: 01/16/2022, 11:38 AM  Clinical Narrative:  Pt now requires home O2. Pt's wife requested [place]. However, their respiratory therapist is off until next week and is the only one who can do new home O2. Wife aware and agreeable to [employer]. Kristin with [employer] notified.        Barriers to Discharge: Barriers Resolved  Expected Discharge Plan and Services       Living arrangements for the past 2 months: Single Family Home Expected Discharge Date: 01/16/22                                     Social Determinants of Health (SDOH) Interventions SDOH Screenings   Food Insecurity: No Food Insecurity (01/12/2022)  Housing: Low Risk  (01/12/2022)  Transportation Needs: No Transportation Needs (01/12/2022)  Utilities: Not At Risk (01/12/2022)  Tobacco Use: Medium Risk (01/12/2022)    Readmission Risk Interventions     No data to display

## 2022-01-16 NOTE — Progress Notes (Signed)
01/16/2022 11:42 AM  Reviewed oxygen saturation with ambulation down to 84%.  Agree with need for temporary home oxygen when ambulating as he recovers from pneumonia.  Murvin Natal MD

## 2022-01-16 NOTE — Care Management Important Message (Signed)
Important Message  Patient Details  Name: David Pruitt MRN: [id number] Date of Birth: 1945/12/19   Medicare Important Message Given:  N/A - LOS <3 / Initial given by admissions     Tommy Medal 01/16/2022, 11:32 AM

## 2022-01-16 NOTE — Discharge Instructions (Signed)

## 2022-01-16 NOTE — Evaluation (Signed)
Physical Therapy Evaluation Patient Details Name: David Pruitt MRN: [id number] DOB: [birth year] Today's Date: 01/16/2022  History of Present Illness  David Pruitt is a 77 year old male with a history of CKD, diabetes mellitus type 2, hypertension, hyperlipidemia, stroke presenting with 1 to 2-day history of generalized weakness, chest discomfort, shortness of breath, and coughing yellow sputum.  He started feeling bad on 01/10/2022, but symptoms significantly worsened on 01/11/2022 when he began developing nausea, vomiting, and diarrhea.  The patient has chronic loose stools for which he takes Imodium.  He states that he takes Imodium 2-3 times per week, last dose on 01/10/2022.  He had some fevers and chills and cold sweats on the evening of 01/11/2022.  There is no hematemesis, hemoptysis, hematochezia, melena.  He denies any dysuria or hematuria.  He states that his chest discomfort is primarily right-sided and pleuritic in nature.  On 01/12/2022, he had worsening generalized weakness to the point where he had difficulty getting into the car to come to the emergency department.    Clinical Impression  Patient's O2 sat monitored throughout session on room air with 85% with ambulation and 89-90% at rest on room air following ambulation with minimal SOB. O2 sat increased quickly with cueing for pursed lip breathing. Patient does not require assist for bed mobility or transfer to standing without AD. Patient able to ambulate while managing IV pole without loss of balance but minimal unsteadiness intermittently. Patient returned to bed at end of session. Patient discharged to care of nursing for ambulation daily as tolerated for length of stay.        Recommendations for follow up therapy are one component of a multi-disciplinary discharge planning process, led by the attending physician.  Recommendations may be updated based on patient status, additional functional criteria and insurance  authorization.  Follow Up Recommendations Home health PT      Assistance Recommended at Discharge PRN  Patient can return home with the following  Assistance with cooking/housework    Equipment Recommendations None recommended by PT  Recommendations for Other Services       Functional Status Assessment Patient has had a recent decline in their functional status and demonstrates the ability to make significant improvements in function in a reasonable and predictable amount of time.     Precautions / Restrictions Precautions Precautions: Fall Restrictions Weight Bearing Restrictions: No      Mobility  Bed Mobility Overal bed mobility: Modified Independent                  Transfers Overall transfer level: Modified independent Equipment used: None                    Ambulation/Gait Ambulation/Gait assistance: Min guard Gait Distance (Feet): 120 Feet Assistive device: IV Pole Gait Pattern/deviations: Step-through pattern, Decreased stride length Gait velocity: decreased     General Gait Details: ambulates without AD with decreased cadence, able to manage IV pole, on room air  Stairs            Wheelchair Mobility    Modified Rankin (Stroke Patients Only)       Balance Overall balance assessment: Mild deficits observed, not formally tested                                           Pertinent Vitals/Pain Pain Assessment Pain Assessment: No/denies pain  Home Living Family/patient expects to be discharged to:: Private residence Living Arrangements: Spouse/significant other Available Help at Discharge: Family Type of Home: House Home Access: Stairs to enter Entrance Stairs-Rails: None Entrance Stairs-Number of Steps: 3   Home Layout: One level Home Equipment: Conservation officer, nature (2 wheels);Shower seat      Prior Function Prior Level of Function : Independent/Modified Independent             Mobility Comments:  community ambulation ADLs Comments: independent     Hand Dominance   Dominant Hand: Right    Extremity/Trunk Assessment   Upper Extremity Assessment Upper Extremity Assessment: Overall WFL for tasks assessed    Lower Extremity Assessment Lower Extremity Assessment: Overall WFL for tasks assessed    Cervical / Trunk Assessment Cervical / Trunk Assessment: Normal  Communication   Communication: No difficulties  Cognition Arousal/Alertness: Awake/alert Behavior During Therapy: WFL for tasks assessed/performed Overall Cognitive Status: Within Functional Limits for tasks assessed                                          General Comments      Exercises     Assessment/Plan    PT Assessment All further PT needs can be met in the next venue of care  PT Problem List Decreased strength;Decreased mobility;Decreased activity tolerance;Decreased balance;Cardiopulmonary status limiting activity       PT Treatment Interventions      PT Goals (Current goals can be found in the Care Plan section)  Acute Rehab PT Goals Patient Stated Goal: return home PT Goal Formulation: With patient Time For Goal Achievement: 01/16/22 Potential to Achieve Goals: Good    Frequency       Co-evaluation               AM-PAC PT "6 Clicks" Mobility  Outcome Measure Help needed turning from your back to your side while in a flat bed without using bedrails?: None Help needed moving from lying on your back to sitting on the side of a flat bed without using bedrails?: None Help needed moving to and from a bed to a chair (including a wheelchair)?: None Help needed standing up from a chair using your arms (e.g., wheelchair or bedside chair)?: None Help needed to walk in hospital room?: A Little Help needed climbing 3-5 steps with a railing? : A Little 6 Click Score: 22    End of Session   Activity Tolerance: Patient tolerated treatment well Patient left: in bed;with call  bell/phone within reach;with family/visitor present Nurse Communication: Mobility status PT Visit Diagnosis: Unsteadiness on feet (R26.81);Other abnormalities of gait and mobility (R26.89)    Time: 2778-2423 PT Time Calculation (min) (ACUTE ONLY): 16 min   Charges:   PT Evaluation $PT Eval Low Complexity: 1 Low PT Treatments $Therapeutic Activity: 8-22 mins        11:12 AM, 01/16/22 Mearl Latin PT, DPT Physical Therapist at [facility]

## 2022-01-16 NOTE — Discharge Summary (Signed)
Physician Discharge Summary  David Pruitt KGM:[id number] DOB: April 23, 1945 DOA: 01/12/2022  PCP: Sharilyn Sites, MD  Admit date: 01/12/2022 Discharge date: 01/16/2022  Admitted From: HOME  Disposition: HOME   Recommendations for Outpatient Follow-up:  Follow up with PCP in 1 weeks Please get a follow up CXR in 4-6 weeks to ensure resolution of pneumonia Please follow up on the following pending results: final culture data   Discharge Condition: STABLE   CODE STATUS: FULL DIET: resume previous home diet    Brief Hospitalization Summary: Please see all hospital notes, images, labs for full details of the hospitalization. ADMISSION HPI:  77 year old male with a history of CKD, diabetes mellitus type 2, hypertension, hyperlipidemia, stroke presenting with 1 to 2-day history of generalized weakness, chest discomfort, shortness of breath, and coughing yellow sputum.  He started feeling bad on 01/10/2022, but symptoms significantly worsened on 01/11/2022 when he began developing nausea, vomiting, and diarrhea.  The patient has chronic loose stools for which he takes Imodium.  He states that he takes Imodium 2-3 times per week, last dose on 01/10/2022.  He had some fevers and chills and cold sweats on the evening of 01/11/2022.  There is no hematemesis, hemoptysis, hematochezia, melena.  He denies any dysuria or hematuria.  He states that his chest discomfort is primarily right-sided and pleuritic in nature.  On 01/12/2022, he had worsening generalized weakness to the point where he had difficulty getting into the car to come to the emergency department.  In the ED, the patient was afebrile and hypotensive with blood pressure 79/38.  Oxygen saturation was 99% on 2 L.  WBC 9.9, hemoglobin 14.5, platelets 227,000.  Sodium 137, potassium 4.2, bicarbonate 19, serum creatinine 2.29.  LFTs showed AST 44, ALT 38, alk phosphatase 59, total bilirubin 1.9.  Chest x-ray showed chronic right greater than left lower lobe  scarring.  There was associated curvilinear opacities and interstitial thickening in the left lower lobe.  The patient was started on vancomycin, ceftriaxone, and azithromycin.  He was admitted for further evaluation and treatment of his sepsis.    HOSPITAL COURSE BY PROBLEM LIST   Septic shock - resolved -Presented with tachycardia, hypotension, lactic acid 6.1 -Secondary to pneumonia -started vancomycin, ceftriaxone, and azithromycin -Follow blood cultures--neg -Obtain UA--no pyuria -Check PCT 46.08>>32.32>>17.15 -reduce rate of IV fluids -Levophed weaned off 01/14/22   Lobar pneumonia -Chest x-ray suggests left lower lobe interstitial thickening and curvilinear opacities -CT chest RLL consolidation; R>L basilar interstitial thickening with GGO and bronchial wall thickening -Check PCT 46.08>>32.32 -MRSA screen--neg -d/c vanc -treated cefepime and azithromycin - DC home on oral doxycycline x 4 more days to complete course  -Check COVID-19 PCR and influenza--negative   Acute respiratory Failure with hypoxia -stable on 2L -wean for saturation > 92% -weaned to room air   Acute on chronic renal failure--CKD 3a -Baseline creatinine 1.2-1.5 -Presented with serum creatinine 2.29 -Secondary to volume depletion and sepsis/hemodynamic changes -continue IVF>>improved   Transaminasemia -Right upper quadrant ultrasound--neg -secondary to sepsis and hypotension -improved   Lactic acidosis - treated and resolved  -TREATED WITH IV fluids -resolved with hydration   Essential hypertension -Initially held metoprolol and lisinopril secondary to hypotension -pt has rebound tachycardia from holding BB -restarted metoprolol 50 mg BID 1/3, resumed toprol XL prior to discharge   Diabetes mellitus type 2 -Holding Ozempic, metformin and glipizide - resume at discharge  -NovoLog sliding scale -Check hemoglobin A1c--6.4% CBG (last 3)  Recent Labs    01/15/22 1617  01/15/22 2119  01/16/22 0739  GLUCAP 155* 165* 168*    History of stroke -Continue Plavix   Discharge Diagnoses:  Principal Problem:   Sepsis due to undetermined organism [facility]) Active Problems:   Lobar pneumonia ([place])   Acute renal failure superimposed on stage 3a chronic kidney disease (HCC)   Lactic acidosis   Acute respiratory failure with hypoxia ([place])   Septic shock ([place])   Discharge Instructions:  Allergies as of 01/16/2022       Reactions   Dapagliflozin    Other reaction(s): Yeast infection   Statins    Other reaction(s): Unknown        Medication List     STOP taking these medications    Praluent 150 MG/ML Soaj Generic drug: Alirocumab       TAKE these medications    Apple Cider Vinegar 500 MG Tabs Take 1 tablet by mouth daily.   ascorbic acid 1000 MG tablet Commonly known as: VITAMIN C Take 1,000 mg by mouth in the morning and at bedtime.   clopidogrel 75 MG tablet Commonly known as: PLAVIX Take 1 tablet (75 mg total) by mouth daily.   CO-Q 10 Omega-3 Fish Oil Caps Take 1 capsule by mouth daily.   cyanocobalamin 1000 MCG tablet Commonly known as: VITAMIN B12 Take 1,000 mcg daily by mouth.   dextromethorphan 30 MG/5ML liquid Commonly known as: DELSYM Take 5 mLs (30 mg total) by mouth 2 (two) times daily as needed for cough.   doxycycline 100 MG capsule Commonly known as: VIBRAMYCIN Take 1 capsule (100 mg total) by mouth 2 (two) times daily for 4 days.   gabapentin 100 MG capsule Commonly known as: NEURONTIN Take 100 mg by mouth 3 (three) times daily.   glipiZIDE 5 MG 24 hr tablet Commonly known as: GLUCOTROL XL Take 5 mg by mouth daily.   glucosamine-chondroitin 500-400 MG tablet Take 1 tablet by mouth daily.   guaiFENesin 600 MG 12 hr tablet Commonly known as: MUCINEX Take 1 tablet (600 mg total) by mouth 2 (two) times daily for 3 days.   lisinopril 10 MG tablet Commonly known as: ZESTRIL Take 10 mg by mouth daily.   loperamide 2 MG  capsule Commonly known as: IMODIUM Take by mouth as needed for diarrhea or loose stools.   loratadine 10 MG tablet Commonly known as: CLARITIN Take 10 mg daily by mouth.   Magnesium 250 MG Tabs Take 1 tablet by mouth daily.   metFORMIN 500 MG 24 hr tablet Commonly known as: GLUCOPHAGE-XR Take 500 mg by mouth daily.   metoprolol succinate 50 MG 24 hr tablet Commonly known as: TOPROL-XL Take 50 mg by mouth daily.   multivitamin with minerals tablet Take 1 tablet by mouth daily.   Ozempic (2 MG/DOSE) 8 MG/3ML Sopn Generic drug: Semaglutide (2 MG/DOSE) Inject 2 mg into the skin once a week.   pantoprazole 40 MG tablet Commonly known as: PROTONIX Take 1 tablet (40 mg total) by mouth daily.   Probiotic (Lactobacillus) Caps Take 1 capsule by mouth daily.   saccharomyces boulardii 250 MG capsule Commonly known as: FLORASTOR Take 1 capsule (250 mg total) by mouth 2 (two) times daily for 14 days.   Turmeric Curcumin 500 MG Caps Take 1 capsule by mouth daily.        Follow-up Information     Sharilyn Sites, MD. Schedule an appointment as soon as possible for a visit.   Specialty: Family Medicine Why: Hospital Follow Up Contact information: [phone] [place] [address] [phone]                Allergies  Allergen Reactions   Dapagliflozin     Other reaction(s): Yeast infection   Statins     Other reaction(s): Unknown   Allergies as of 01/16/2022       Reactions   Dapagliflozin    Other reaction(s): Yeast infection   Statins    Other reaction(s): Unknown        Medication List     STOP taking these medications    Praluent 150 MG/ML Soaj Generic drug: Alirocumab       TAKE these medications    Apple Cider Vinegar 500 MG Tabs Take 1 tablet by mouth daily.   ascorbic acid 1000 MG tablet Commonly known as: VITAMIN C Take 1,000 mg by mouth in the morning and at bedtime.   clopidogrel 75 MG tablet Commonly known as:  PLAVIX Take 1 tablet (75 mg total) by mouth daily.   CO-Q 10 Omega-3 Fish Oil Caps Take 1 capsule by mouth daily.   cyanocobalamin 1000 MCG tablet Commonly known as: VITAMIN B12 Take 1,000 mcg daily by mouth.   dextromethorphan 30 MG/5ML liquid Commonly known as: DELSYM Take 5 mLs (30 mg total) by mouth 2 (two) times daily as needed for cough.   doxycycline 100 MG capsule Commonly known as: VIBRAMYCIN Take 1 capsule (100 mg total) by mouth 2 (two) times daily for 4 days.   gabapentin 100 MG capsule Commonly known as: NEURONTIN Take 100 mg by mouth 3 (three) times daily.   glipiZIDE 5 MG 24 hr tablet Commonly known as: GLUCOTROL XL Take 5 mg by mouth daily.   glucosamine-chondroitin 500-400 MG tablet Take 1 tablet by mouth daily.   guaiFENesin 600 MG 12 hr tablet Commonly known as: MUCINEX Take 1 tablet (600 mg total) by mouth 2 (two) times daily for 3 days.   lisinopril 10 MG tablet Commonly known as: ZESTRIL Take 10 mg by mouth daily.   loperamide 2 MG capsule Commonly known as: IMODIUM Take by mouth as needed for diarrhea or loose stools.   loratadine 10 MG tablet Commonly known as: CLARITIN Take 10 mg daily by mouth.   Magnesium 250 MG Tabs Take 1 tablet by mouth daily.   metFORMIN 500 MG 24 hr tablet Commonly known as: GLUCOPHAGE-XR Take 500 mg by mouth daily.   metoprolol succinate 50 MG 24 hr tablet Commonly known as: TOPROL-XL Take 50 mg by mouth daily.   multivitamin with minerals tablet Take 1 tablet by mouth daily.   Ozempic (2 MG/DOSE) 8 MG/3ML Sopn Generic drug: Semaglutide (2 MG/DOSE) Inject 2 mg into the skin once a week.   pantoprazole 40 MG tablet Commonly known as: PROTONIX Take 1 tablet (40 mg total) by mouth daily.   Probiotic (Lactobacillus) Caps Take 1 capsule by mouth daily.   saccharomyces boulardii 250 MG capsule Commonly known as: FLORASTOR Take 1 capsule (250 mg total) by mouth 2 (two) times daily for 14 days.    Turmeric Curcumin 500 MG Caps Take 1 capsule by mouth daily.        Procedures/Studies: US RENAL  Result Date: 01/14/2022 CLINICAL DATA:  Left renal mass EXAM: RENAL / URINARY TRACT ULTRASOUND COMPLETE COMPARISON:  CT 12/02/2018. FINDINGS: Right Kidney: Renal measurements: 11.4 x 6.7 x 5.3 cm = volume: 214.8 mL. Echogenicity within normal limits. No mass or hydronephrosis visualized. Left Kidney: Renal measurements: 11.0 x 6.0 x 5.2  cm = volume: 180.0 mL. No hydronephrosis. Echogenicity within normal limits. Cystic lesion in the left upper pole measuring 1.8 x 1.5 x 2.0 cm, appears simple. No other renal mass identified on limited views (prior CT noted 2 lesions). Bladder: Appears normal for degree of bladder distention. Other: None. IMPRESSION: Cystic lesion in the left upper pole measures 1.8 x 1.5 x 2.0 cm, appears to be a simple cyst on limited views. No other renal mass identified (prior CT noted 2 lesions). Non-emergent contrast enhanced renal protocol CT or MRI as an outpatient is recommended for further evaluation. Electronically Signed   By: Maurine Simmering M.D.   On: 01/14/2022 09:34   US Abdomen Limited RUQ (LIVER/GB)  Result Date: 01/13/2022 CLINICAL DATA:  77 year old male with elevated LFTs. EXAM: ULTRASOUND ABDOMEN LIMITED RIGHT UPPER QUADRANT COMPARISON:  12/02/2018 CT FINDINGS: Gallbladder: The gallbladder is unremarkable. There is no evidence of cholelithiasis or acute cholecystitis. Common bile duct: Diameter: 4 mm. There is no evidence of intrahepatic or extrahepatic biliary dilatation. Liver: No focal lesion identified. Within normal limits in parenchymal echogenicity. Portal vein is patent on color Doppler imaging with normal direction of blood flow towards the liver. Other: None. IMPRESSION: Unremarkable RIGHT UPPER quadrant abdominal ultrasound. Electronically Signed   By: Margarette Canada M.D.   On: 01/13/2022 09:26   CT CHEST WO CONTRAST  Result Date: 01/12/2022 CLINICAL DATA:   Respiratory illness, nondiagnostic x-ray. Shortness of breath and generalized weakness EXAM: CT CHEST WITHOUT CONTRAST TECHNIQUE: Multidetector CT imaging of the chest was performed following the standard protocol without IV contrast. RADIATION DOSE REDUCTION: This exam was performed according to the departmental dose-optimization program which includes automated exposure control, adjustment of the mA and/or kV according to patient size and/or use of iterative reconstruction technique. COMPARISON:  Chest CT April 15, 2020. FINDINGS: Cardiovascular: Aortic and branch vessel atherosclerosis. Enlarged main pulmonary artery measuring 3.2 cm. Coronary artery calcifications. Normal size heart. No significant pericardial effusion/thickening. Mediastinum/Nodes: No suspicious thyroid nodule. No pathologically enlarged mediastinal, hilar or axillary lymph nodes, noting limited sensitivity for the detection of hilar adenopathy on this noncontrast study. Small hiatal hernia. Lungs/Pleura: Right lower lobe consolidation with right-greater-than-left basilar interstitial thickening and interposed ground-glass opacities and bronchial wall thickening. Similar left pleural thickening with linear bands of scarring/architectural distortion. Stable calcified subpleural pulmonary nodules in the left lower lobe. Evaluation of the noncalcified pulmonary nodule seen on prior CTs areas limited by acute inflammatory process. Similar biapical pleuroparenchymal scarring. Paraseptal and centrilobular emphysema. Upper Abdomen: Nonobstructive left upper pole renal stone. Left upper pole renal lesion measures 15 mm at Hounsfield units of 22, greater than that expected for a simple cyst. Additional 18 mm hypodense left upper pole renal lesion measures Hounsfield units of 7 compatible with a benign cysts. Musculoskeletal: Multilevel degenerative changes spine. IMPRESSION: 1. Right lower lobe consolidation with right-greater-than-left basilar  interstitial thickening and interposed ground-glass opacities and bronchial wall thickening, most consistent with an infectious or inflammatory process. Suggest follow-up CT in 2-3 months to ensure resolution. 2. Enlarged main pulmonary artery, which can be seen in the setting of pulmonary arterial hypertension. 3. Left upper pole renal lesion measures 15 mm at Hounsfield units of 22, greater than that expected for a simple cyst. Recommend further evaluation with nonemergent renal ultrasound. 4. Nonobstructive left upper pole renal stone. 5.  Aortic Atherosclerosis (ICD10-I70.0). Electronically Signed   By: Dahlia Bailiff M.D.   On: 01/12/2022 20:11   DG Chest Port 1 View  Result Date: 01/12/2022  CLINICAL DATA:  Shortness of breath. EXAM: PORTABLE CHEST 1 VIEW COMPARISON:  Chest and left rib radiographs 06/17/2021, CT chest 04/15/2020 FINDINGS: Cardiac silhouette is upper limits of normal size. Mediastinal contours are within normal limits. On prior 04/15/2020 CT, left-greater-than-right lower lung curvilinear scarring was seen. Associated curvilinear opacities and interstitial thickening on the current study appear slightly increased compared to 06/17/2021 frontal chest radiograph. Increased lucencies and attenuation of the superior pulmonary vasculature corresponding to chronic emphysematous changes seen on prior CT. No pleural effusion pneumothorax. No acute skeletal abnormality. IMPRESSION: Chronic left-greater-than-right lower lung scarring. The associated curvilinear opacities appear slightly thickened compared to 06/17/2021 prior radiographs, however otherwise no focal airspace opacity is seen. This may be due to increased overlying atelectasis. It is difficult to completely exclude left basilar pneumonia. Electronically Signed   By: Yvonne Kendall M.D.   On: 01/12/2022 12:49     Subjective: Pt reports feeling much better today.  He is ambulating in room.  He has remained on room air oxygen.     Discharge Exam: Vitals:   01/16/22 0421 01/16/22 0743  BP: 125/76 (!) 149/86  Pulse: 94 (!) 102  Resp: 20 18  Temp: 98.6 F (37 C) 97.7 F (36.5 C)  SpO2: 90% 92%   Vitals:   01/15/22 2017 01/16/22 0048 01/16/22 0421 01/16/22 0743  BP: (!) 142/82 136/81 125/76 (!) 149/86  Pulse: (!) 105 98 94 (!) 102  Resp: '20 19 20 18  '$ Temp: 98.3 F (36.8 C) 98.5 F (36.9 C) 98.6 F (37 C) 97.7 F (36.5 C)  TempSrc: Oral Oral Oral   SpO2: 93% 93% 90% 92%  Weight: 84.5 kg  84.5 kg   Height:       General: Pt is alert, awake, not in acute distress Cardiovascular: normal S1/S2 +, no rubs, no gallops Respiratory: CTA bilaterally, no wheezing, no rhonchi Abdominal: Soft, NT, ND, bowel sounds + Extremities: no edema, no cyanosis   The results of significant diagnostics from this hospitalization (including imaging, microbiology, ancillary and laboratory) are listed below for reference.     Microbiology: Recent Results (from the past 240 hour(s))  Culture, blood (Routine x 2)     Status: None (Preliminary result)   Collection Time: 01/12/22 11:50 AM   Specimen: BLOOD  Result Value Ref Range Status   Specimen Description BLOOD LEFT ANTECUBITAL  Final   Special Requests   Final    BOTTLES DRAWN AEROBIC AND ANAEROBIC Blood Culture adequate volume   Culture   Final    NO GROWTH 4 DAYS Performed at [facility], [address] [place], [place] [address]    Report Status PENDING  Incomplete  Culture, blood (Routine x 2)     Status: None (Preliminary result)   Collection Time: 01/12/22 11:55 AM   Specimen: BLOOD  Result Value Ref Range Status   Specimen Description BLOOD BLOOD RIGHT FOREARM  Final   Special Requests   Final    BOTTLES DRAWN AEROBIC AND ANAEROBIC Blood Culture adequate volume   Culture   Final    NO GROWTH 4 DAYS Performed at [facility], [address], [place], [place] [address]    Report Status PENDING  Incomplete  Resp panel by RT-PCR (RSV, Flu A&B, Covid)  Anterior Nasal Swab     Status: None   Collection Time: 01/12/22 12:09 PM   Specimen: Anterior Nasal Swab  Result Value Ref Range Status   SARS Coronavirus 2 by RT PCR NEGATIVE NEGATIVE Final  Comment: (NOTE) SARS-CoV-2 target nucleic acids are NOT DETECTED.  The SARS-CoV-2 RNA is generally detectable in upper respiratory specimens during the acute phase of infection. The lowest concentration of SARS-CoV-2 viral copies this assay can detect is 138 copies/mL. A negative result does not preclude SARS-Cov-2 infection and should not be used as the sole basis for treatment or other patient management decisions. A negative result may occur with  improper specimen collection/handling, submission of specimen other than nasopharyngeal swab, presence of viral mutation(s) within the areas targeted by this assay, and inadequate number of viral copies(<138 copies/mL). A negative result must be combined with clinical observations, patient history, and epidemiological information. The expected result is Negative.  Fact Sheet for Patients:  EntrepreneurPulse.com.au  Fact Sheet for Healthcare Providers:  IncredibleEmployment.be  This test is no t yet approved or cleared by the [place] FDA and  has been authorized for detection and/or diagnosis of SARS-CoV-2 by FDA under an Emergency Use Authorization (EUA). This EUA will remain  in effect (meaning this test can be used) for the duration of the COVID-19 declaration under Section 564(b)(1) of the Act, 21 U.S.C.section 360bbb-3(b)(1), unless the authorization is terminated  or revoked sooner.       Influenza A by PCR NEGATIVE NEGATIVE Final   Influenza B by PCR NEGATIVE NEGATIVE Final    Comment: (NOTE) The Xpert Xpress SARS-CoV-2/FLU/RSV plus assay is intended as an aid in the diagnosis of influenza from Nasopharyngeal swab specimens and should not be used as a sole basis for treatment. Nasal washings  and aspirates are unacceptable for Xpert Xpress SARS-CoV-2/FLU/RSV testing.  Fact Sheet for Patients: EntrepreneurPulse.com.au  Fact Sheet for Healthcare Providers: IncredibleEmployment.be  This test is not yet approved or cleared by the [place] FDA and has been authorized for detection and/or diagnosis of SARS-CoV-2 by FDA under an Emergency Use Authorization (EUA). This EUA will remain in effect (meaning this test can be used) for the duration of the COVID-19 declaration under Section 564(b)(1) of the Act, 21 U.S.C. section 360bbb-3(b)(1), unless the authorization is terminated or revoked.     Resp Syncytial Virus by PCR NEGATIVE NEGATIVE Final    Comment: (NOTE) Fact Sheet for Patients: EntrepreneurPulse.com.au  Fact Sheet for Healthcare Providers: IncredibleEmployment.be  This test is not yet approved or cleared by the [place] FDA and has been authorized for detection and/or diagnosis of SARS-CoV-2 by FDA under an Emergency Use Authorization (EUA). This EUA will remain in effect (meaning this test can be used) for the duration of the COVID-19 declaration under Section 564(b)(1) of the Act, 21 U.S.C. section 360bbb-3(b)(1), unless the authorization is terminated or revoked.  Performed at [facility], [address], [place], [place] [address]   MRSA Next Gen by PCR, Nasal     Status: None   Collection Time: 01/12/22  3:05 PM   Specimen: Nasal Mucosa; Nasal Swab  Result Value Ref Range Status   MRSA by PCR Next Gen NOT DETECTED NOT DETECTED Final    Comment: (NOTE) The GeneXpert MRSA Assay (FDA approved for NASAL specimens only), is one component of a comprehensive MRSA colonization surveillance program. It is not intended to diagnose MRSA infection nor to guide or monitor treatment for MRSA infections. Test performance is not FDA approved in patients less than 13 years old. Performed  at [facility], [address], [place], [place] [address]   Urine Culture     Status: None   Collection Time: 01/12/22  7:53 PM   Specimen:  Urine, Clean Catch  Result Value Ref Range Status   Specimen Description   Final    URINE, CLEAN CATCH Performed at [facility], [address], [place], [place] [address]    Special Requests   Final    NONE Performed at [facility], [address], [place], [place] [address]    Culture   Final    NO GROWTH Performed at [facility], [place] [address] [place], [place] [address]    Report Status 01/14/2022 FINAL  Final     Labs: BNP (last 3 results) No results for input(s): "BNP" in the last 8760 hours. Basic Metabolic Panel: Recent Labs  Lab 01/12/22 1147 01/13/22 0451 01/14/22 0502 01/15/22 0459 01/16/22 0311  NA 137 139  --  139 138  K 4.2 4.3  --  3.4* 3.8  CL 103 108  --  107 105  CO2 19* 21*  --  24 22  GLUCOSE 259* 158*  --  147* 163*  BUN 34* 32*  --  19 23  CREATININE 2.29* 1.49* 1.03 1.11 1.14  CALCIUM 9.2 8.3*  --  8.9 8.9  MG  --   --   --  1.7  --    Liver Function Tests: Recent Labs  Lab 01/12/22 1147 01/13/22 0451  AST 44* 25  ALT 38 29  ALKPHOS 59 47  BILITOT 1.9* 0.7  PROT 6.8 5.8*  ALBUMIN 3.8 3.0*   Recent Labs  Lab 01/12/22 1604  LIPASE 25   No results for input(s): "AMMONIA" in the last 168 hours. CBC: Recent Labs  Lab 01/12/22 1150 01/13/22 0451 01/15/22 0459 01/16/22 0311  WBC 9.9 18.2* 14.8* 12.6*  NEUTROABS 8.7*  --  12.4* 9.4*  HGB 14.5 12.1* 11.7* 12.2*  HCT 44.0 36.1* 35.3* 36.4*  MCV 97.6 97.0 95.9 94.5  PLT 227 179 172 205   Cardiac Enzymes: No results for input(s): "CKTOTAL", "CKMB", "CKMBINDEX", "TROPONINI" in the last 168 hours. BNP: Invalid input(s): "POCBNP" CBG: Recent Labs  Lab 01/15/22 0725 01/15/22 1134 01/15/22 1617 01/15/22 2119 01/16/22 0739  GLUCAP 137* 201* 155* 165* 168*   D-Dimer No results for input(s): "DDIMER" in the last 72 hours. Hgb  A1c No results for input(s): "HGBA1C" in the last 72 hours. Lipid Profile No results for input(s): "CHOL", "HDL", "LDLCALC", "TRIG", "CHOLHDL", "LDLDIRECT" in the last 72 hours. Thyroid function studies No results for input(s): "TSH", "T4TOTAL", "T3FREE", "THYROIDAB" in the last 72 hours.  Invalid input(s): "FREET3" Anemia work up No results for input(s): "VITAMINB12", "FOLATE", "FERRITIN", "TIBC", "IRON", "RETICCTPCT" in the last 72 hours. Urinalysis    Component Value Date/Time   COLORURINE YELLOW 01/12/2022 1953   APPEARANCEUR HAZY (A) 01/12/2022 1953   APPEARANCEUR Clear 10/09/2020 1438   LABSPEC 1.021 01/12/2022 1953   PHURINE 5.0 01/12/2022 1953   GLUCOSEU NEGATIVE 01/12/2022 1953   HGBUR NEGATIVE 01/12/2022 1953   BILIRUBINUR NEGATIVE 01/12/2022 1953   BILIRUBINUR Negative 10/09/2020 1438   [place] 01/12/2022 1953   PROTEINUR 30 (A) 01/12/2022 1953   NITRITE NEGATIVE 01/12/2022 1953   LEUKOCYTESUR NEGATIVE 01/12/2022 1953   Sepsis Labs Recent Labs  Lab 01/12/22 1150 01/13/22 0451 01/15/22 0459 01/16/22 0311  WBC 9.9 18.2* 14.8* 12.6*   Microbiology Recent Results (from the past 240 hour(s))  Culture, blood (Routine x 2)     Status: None (Preliminary result)   Collection Time: 01/12/22 11:50 AM   Specimen: BLOOD  Result Value Ref Range Status   Specimen Description  BLOOD LEFT ANTECUBITAL  Final   Special Requests   Final    BOTTLES DRAWN AEROBIC AND ANAEROBIC Blood Culture adequate volume   Culture   Final    NO GROWTH 4 DAYS Performed at [facility], [address], [place], [place] [address]    Report Status PENDING  Incomplete  Culture, blood (Routine x 2)     Status: None (Preliminary result)   Collection Time: 01/12/22 11:55 AM   Specimen: BLOOD  Result Value Ref Range Status   Specimen Description BLOOD BLOOD RIGHT FOREARM  Final   Special Requests   Final    BOTTLES DRAWN AEROBIC AND ANAEROBIC Blood Culture adequate volume   Culture    Final    NO GROWTH 4 DAYS Performed at [facility], [address], [place], [place] [address]    Report Status PENDING  Incomplete  Resp panel by RT-PCR (RSV, Flu A&B, Covid) Anterior Nasal Swab     Status: None   Collection Time: 01/12/22 12:09 PM   Specimen: Anterior Nasal Swab  Result Value Ref Range Status   SARS Coronavirus 2 by RT PCR NEGATIVE NEGATIVE Final    Comment: (NOTE) SARS-CoV-2 target nucleic acids are NOT DETECTED.  The SARS-CoV-2 RNA is generally detectable in upper respiratory specimens during the acute phase of infection. The lowest concentration of SARS-CoV-2 viral copies this assay can detect is 138 copies/mL. A negative result does not preclude SARS-Cov-2 infection and should not be used as the sole basis for treatment or other patient management decisions. A negative result may occur with  improper specimen collection/handling, submission of specimen other than nasopharyngeal swab, presence of viral mutation(s) within the areas targeted by this assay, and inadequate number of viral copies(<138 copies/mL). A negative result must be combined with clinical observations, patient history, and epidemiological information. The expected result is Negative.  Fact Sheet for Patients:  EntrepreneurPulse.com.au  Fact Sheet for Healthcare Providers:  IncredibleEmployment.be  This test is no t yet approved or cleared by the [place] FDA and  has been authorized for detection and/or diagnosis of SARS-CoV-2 by FDA under an Emergency Use Authorization (EUA). This EUA will remain  in effect (meaning this test can be used) for the duration of the COVID-19 declaration under Section 564(b)(1) of the Act, 21 U.S.C.section 360bbb-3(b)(1), unless the authorization is terminated  or revoked sooner.       Influenza A by PCR NEGATIVE NEGATIVE Final   Influenza B by PCR NEGATIVE NEGATIVE Final    Comment: (NOTE) The Xpert Xpress  SARS-CoV-2/FLU/RSV plus assay is intended as an aid in the diagnosis of influenza from Nasopharyngeal swab specimens and should not be used as a sole basis for treatment. Nasal washings and aspirates are unacceptable for Xpert Xpress SARS-CoV-2/FLU/RSV testing.  Fact Sheet for Patients: EntrepreneurPulse.com.au  Fact Sheet for Healthcare Providers: IncredibleEmployment.be  This test is not yet approved or cleared by the [place] FDA and has been authorized for detection and/or diagnosis of SARS-CoV-2 by FDA under an Emergency Use Authorization (EUA). This EUA will remain in effect (meaning this test can be used) for the duration of the COVID-19 declaration under Section 564(b)(1) of the Act, 21 U.S.C. section 360bbb-3(b)(1), unless the authorization is terminated or revoked.     Resp Syncytial Virus by PCR NEGATIVE NEGATIVE Final    Comment: (NOTE) Fact Sheet for Patients: EntrepreneurPulse.com.au  Fact Sheet for Healthcare Providers: IncredibleEmployment.be  This test is not yet approved or cleared by the [place] and  has been authorized for detection and/or diagnosis of SARS-CoV-2 by FDA under an Emergency Use Authorization (EUA). This EUA will remain in effect (meaning this test can be used) for the duration of the COVID-19 declaration under Section 564(b)(1) of the Act, 21 U.S.C. section 360bbb-3(b)(1), unless the authorization is terminated or revoked.  Performed at [facility], [address] [place], [place] [address]   MRSA Next Gen by PCR, Nasal     Status: None   Collection Time: 01/12/22  3:05 PM   Specimen: Nasal Mucosa; Nasal Swab  Result Value Ref Range Status   MRSA by PCR Next Gen NOT DETECTED NOT DETECTED Final    Comment: (NOTE) The GeneXpert MRSA Assay (FDA approved for NASAL specimens only), is one component of a comprehensive MRSA colonization surveillance program.  It is not intended to diagnose MRSA infection nor to guide or monitor treatment for MRSA infections. Test performance is not FDA approved in patients less than 24 years old. Performed at [facility], [address] [place], [place] [address]   Urine Culture     Status: None   Collection Time: 01/12/22  7:53 PM   Specimen: Urine, Clean Catch  Result Value Ref Range Status   Specimen Description   Final    URINE, CLEAN CATCH Performed at [facility], [address] [place], [place] [address]    Special Requests   Final    NONE Performed at [facility], [address] [place], [place] [address]    Culture   Final    NO GROWTH Performed at [facility], [place] [address] [place],  [address]    Report Status 01/14/2022 FINAL  Final    Time coordinating discharge: 38 mins   SIGNED:  Irwin Brakeman, MD  [facility] 01/16/2022, 11:01 AM How to contact the [facility] Attending or Consulting provider [place] or covering provider during after hours [place], for this patient?  Check the care team in [facility] and look for a) attending/consulting TRH provider listed and b) the [facility] team listed Log into [email or web address] and use [place]'s universal password to access. If you do not have the password, please contact the hospital operator. Locate the [facility] provider you are looking for under [facility] and page to a number that you can be directly reached. If you still have difficulty reaching the provider, please page the [facility] (Director on Call) for the Hospitalists listed on amion for assistance.

## 2022-01-16 NOTE — TOC Transition Note (Addendum)
Transition of Care [facility]) - CM/SW Discharge Note   Patient Details  Name: David Pruitt MRN: [id number] Date of Birth: 05-11-1945  [facility]) CM/SW Contact:  Salome Arnt, LCSW Phone Number: 01/16/2022, 1:42 PM   Clinical Narrative:  Pt d/c today. [employer] to deliver portable O2 to room. Pt's wife agreeable to HHPT as recommended by PT. No preference on agency. Referred and accepted by Caryl Pina with [employer]. Order in.    Update: Per wife, O2 has been delivered to room.   Final next level of care: [place] Barriers to Discharge: Barriers Resolved   Patient Goals and CMS Choice      Discharge Placement                    Name of family member notified: wife Patient and family notified of of transfer: 01/16/22  Discharge Plan and Services Additional resources added to the After Visit Summary for                  DME Arranged: Oxygen DME Agency: [employer] Date DME Agency Contacted: 01/16/22 Time DME Agency Contacted: [id number] Representative spoke with at DME Agency: [place]: PT [place]: [place] ([place]) Date [place]: 01/16/22 Time [place]: 1341 Representative spoke with at [place]: [place] ([place]) Interventions SDOH Screenings   Food Insecurity: No Food Insecurity (01/12/2022)  Housing: Low Risk  (01/12/2022)  Transportation Needs: No Transportation Needs (01/12/2022)  Utilities: Not At Risk (01/12/2022)  Tobacco Use: Medium Risk (01/12/2022)     Readmission Risk Interventions     No data to display

## 2022-01-17 LAB — CULTURE, BLOOD (ROUTINE X 2)
Culture: NO GROWTH
Culture: NO GROWTH
Special Requests: ADEQUATE
Special Requests: ADEQUATE

## 2022-01-19 DIAGNOSIS — N1831 Chronic kidney disease, stage 3a: Secondary | ICD-10-CM | POA: Diagnosis not present

## 2022-01-19 DIAGNOSIS — N179 Acute kidney failure, unspecified: Secondary | ICD-10-CM | POA: Diagnosis not present

## 2022-01-19 DIAGNOSIS — Z7985 Long-term (current) use of injectable non-insulin antidiabetic drugs: Secondary | ICD-10-CM | POA: Diagnosis not present

## 2022-01-19 DIAGNOSIS — J9601 Acute respiratory failure with hypoxia: Secondary | ICD-10-CM | POA: Diagnosis not present

## 2022-01-19 DIAGNOSIS — Z7902 Long term (current) use of antithrombotics/antiplatelets: Secondary | ICD-10-CM | POA: Diagnosis not present

## 2022-01-19 DIAGNOSIS — R7401 Elevation of levels of liver transaminase levels: Secondary | ICD-10-CM | POA: Diagnosis not present

## 2022-01-19 DIAGNOSIS — Z7984 Long term (current) use of oral hypoglycemic drugs: Secondary | ICD-10-CM | POA: Diagnosis not present

## 2022-01-19 DIAGNOSIS — K219 Gastro-esophageal reflux disease without esophagitis: Secondary | ICD-10-CM | POA: Diagnosis not present

## 2022-01-19 DIAGNOSIS — Z87891 Personal history of nicotine dependence: Secondary | ICD-10-CM | POA: Diagnosis not present

## 2022-01-19 DIAGNOSIS — I129 Hypertensive chronic kidney disease with stage 1 through stage 4 chronic kidney disease, or unspecified chronic kidney disease: Secondary | ICD-10-CM | POA: Diagnosis not present

## 2022-01-19 DIAGNOSIS — Z8673 Personal history of transient ischemic attack (TIA), and cerebral infarction without residual deficits: Secondary | ICD-10-CM | POA: Diagnosis not present

## 2022-01-19 DIAGNOSIS — E1122 Type 2 diabetes mellitus with diabetic chronic kidney disease: Secondary | ICD-10-CM | POA: Diagnosis not present

## 2022-01-19 DIAGNOSIS — K2970 Gastritis, unspecified, without bleeding: Secondary | ICD-10-CM | POA: Diagnosis not present

## 2022-01-19 DIAGNOSIS — E7800 Pure hypercholesterolemia, unspecified: Secondary | ICD-10-CM | POA: Diagnosis not present

## 2022-01-20 DIAGNOSIS — E118 Type 2 diabetes mellitus with unspecified complications: Secondary | ICD-10-CM | POA: Diagnosis not present

## 2022-01-20 DIAGNOSIS — A419 Sepsis, unspecified organism: Secondary | ICD-10-CM | POA: Diagnosis not present

## 2022-01-21 DIAGNOSIS — E1165 Type 2 diabetes mellitus with hyperglycemia: Secondary | ICD-10-CM | POA: Diagnosis not present

## 2022-01-22 DIAGNOSIS — E7800 Pure hypercholesterolemia, unspecified: Secondary | ICD-10-CM | POA: Diagnosis not present

## 2022-01-22 DIAGNOSIS — R7401 Elevation of levels of liver transaminase levels: Secondary | ICD-10-CM | POA: Diagnosis not present

## 2022-01-22 DIAGNOSIS — Z7984 Long term (current) use of oral hypoglycemic drugs: Secondary | ICD-10-CM | POA: Diagnosis not present

## 2022-01-22 DIAGNOSIS — K219 Gastro-esophageal reflux disease without esophagitis: Secondary | ICD-10-CM | POA: Diagnosis not present

## 2022-01-22 DIAGNOSIS — Z87891 Personal history of nicotine dependence: Secondary | ICD-10-CM | POA: Diagnosis not present

## 2022-01-22 DIAGNOSIS — N179 Acute kidney failure, unspecified: Secondary | ICD-10-CM | POA: Diagnosis not present

## 2022-01-22 DIAGNOSIS — J9601 Acute respiratory failure with hypoxia: Secondary | ICD-10-CM | POA: Diagnosis not present

## 2022-01-22 DIAGNOSIS — N1831 Chronic kidney disease, stage 3a: Secondary | ICD-10-CM | POA: Diagnosis not present

## 2022-01-22 DIAGNOSIS — K2970 Gastritis, unspecified, without bleeding: Secondary | ICD-10-CM | POA: Diagnosis not present

## 2022-01-22 DIAGNOSIS — E1122 Type 2 diabetes mellitus with diabetic chronic kidney disease: Secondary | ICD-10-CM | POA: Diagnosis not present

## 2022-01-22 DIAGNOSIS — I129 Hypertensive chronic kidney disease with stage 1 through stage 4 chronic kidney disease, or unspecified chronic kidney disease: Secondary | ICD-10-CM | POA: Diagnosis not present

## 2022-01-22 DIAGNOSIS — Z7902 Long term (current) use of antithrombotics/antiplatelets: Secondary | ICD-10-CM | POA: Diagnosis not present

## 2022-01-22 DIAGNOSIS — Z7985 Long-term (current) use of injectable non-insulin antidiabetic drugs: Secondary | ICD-10-CM | POA: Diagnosis not present

## 2022-01-22 DIAGNOSIS — Z8673 Personal history of transient ischemic attack (TIA), and cerebral infarction without residual deficits: Secondary | ICD-10-CM | POA: Diagnosis not present

## 2022-01-23 DIAGNOSIS — E1122 Type 2 diabetes mellitus with diabetic chronic kidney disease: Secondary | ICD-10-CM | POA: Diagnosis not present

## 2022-01-23 DIAGNOSIS — J9601 Acute respiratory failure with hypoxia: Secondary | ICD-10-CM | POA: Diagnosis not present

## 2022-01-23 DIAGNOSIS — N179 Acute kidney failure, unspecified: Secondary | ICD-10-CM | POA: Diagnosis not present

## 2022-01-26 DIAGNOSIS — Z8673 Personal history of transient ischemic attack (TIA), and cerebral infarction without residual deficits: Secondary | ICD-10-CM | POA: Diagnosis not present

## 2022-01-26 DIAGNOSIS — Z7902 Long term (current) use of antithrombotics/antiplatelets: Secondary | ICD-10-CM | POA: Diagnosis not present

## 2022-01-26 DIAGNOSIS — R7401 Elevation of levels of liver transaminase levels: Secondary | ICD-10-CM | POA: Diagnosis not present

## 2022-01-26 DIAGNOSIS — E7800 Pure hypercholesterolemia, unspecified: Secondary | ICD-10-CM | POA: Diagnosis not present

## 2022-01-26 DIAGNOSIS — K2970 Gastritis, unspecified, without bleeding: Secondary | ICD-10-CM | POA: Diagnosis not present

## 2022-01-26 DIAGNOSIS — J9601 Acute respiratory failure with hypoxia: Secondary | ICD-10-CM | POA: Diagnosis not present

## 2022-01-26 DIAGNOSIS — Z7984 Long term (current) use of oral hypoglycemic drugs: Secondary | ICD-10-CM | POA: Diagnosis not present

## 2022-01-26 DIAGNOSIS — N1831 Chronic kidney disease, stage 3a: Secondary | ICD-10-CM | POA: Diagnosis not present

## 2022-01-26 DIAGNOSIS — N179 Acute kidney failure, unspecified: Secondary | ICD-10-CM | POA: Diagnosis not present

## 2022-01-26 DIAGNOSIS — K219 Gastro-esophageal reflux disease without esophagitis: Secondary | ICD-10-CM | POA: Diagnosis not present

## 2022-01-26 DIAGNOSIS — Z7985 Long-term (current) use of injectable non-insulin antidiabetic drugs: Secondary | ICD-10-CM | POA: Diagnosis not present

## 2022-01-26 DIAGNOSIS — I129 Hypertensive chronic kidney disease with stage 1 through stage 4 chronic kidney disease, or unspecified chronic kidney disease: Secondary | ICD-10-CM | POA: Diagnosis not present

## 2022-01-26 DIAGNOSIS — E1122 Type 2 diabetes mellitus with diabetic chronic kidney disease: Secondary | ICD-10-CM | POA: Diagnosis not present

## 2022-01-26 DIAGNOSIS — Z87891 Personal history of nicotine dependence: Secondary | ICD-10-CM | POA: Diagnosis not present

## 2022-01-29 DIAGNOSIS — K219 Gastro-esophageal reflux disease without esophagitis: Secondary | ICD-10-CM | POA: Diagnosis not present

## 2022-01-29 DIAGNOSIS — N179 Acute kidney failure, unspecified: Secondary | ICD-10-CM | POA: Diagnosis not present

## 2022-01-29 DIAGNOSIS — I129 Hypertensive chronic kidney disease with stage 1 through stage 4 chronic kidney disease, or unspecified chronic kidney disease: Secondary | ICD-10-CM | POA: Diagnosis not present

## 2022-01-29 DIAGNOSIS — R7401 Elevation of levels of liver transaminase levels: Secondary | ICD-10-CM | POA: Diagnosis not present

## 2022-01-29 DIAGNOSIS — J9601 Acute respiratory failure with hypoxia: Secondary | ICD-10-CM | POA: Diagnosis not present

## 2022-01-29 DIAGNOSIS — E7800 Pure hypercholesterolemia, unspecified: Secondary | ICD-10-CM | POA: Diagnosis not present

## 2022-01-29 DIAGNOSIS — N1831 Chronic kidney disease, stage 3a: Secondary | ICD-10-CM | POA: Diagnosis not present

## 2022-01-29 DIAGNOSIS — Z8673 Personal history of transient ischemic attack (TIA), and cerebral infarction without residual deficits: Secondary | ICD-10-CM | POA: Diagnosis not present

## 2022-01-29 DIAGNOSIS — Z87891 Personal history of nicotine dependence: Secondary | ICD-10-CM | POA: Diagnosis not present

## 2022-01-29 DIAGNOSIS — Z7984 Long term (current) use of oral hypoglycemic drugs: Secondary | ICD-10-CM | POA: Diagnosis not present

## 2022-01-29 DIAGNOSIS — Z7902 Long term (current) use of antithrombotics/antiplatelets: Secondary | ICD-10-CM | POA: Diagnosis not present

## 2022-01-29 DIAGNOSIS — E1122 Type 2 diabetes mellitus with diabetic chronic kidney disease: Secondary | ICD-10-CM | POA: Diagnosis not present

## 2022-01-29 DIAGNOSIS — Z7985 Long-term (current) use of injectable non-insulin antidiabetic drugs: Secondary | ICD-10-CM | POA: Diagnosis not present

## 2022-01-29 DIAGNOSIS — K2970 Gastritis, unspecified, without bleeding: Secondary | ICD-10-CM | POA: Diagnosis not present

## 2022-02-05 DIAGNOSIS — N179 Acute kidney failure, unspecified: Secondary | ICD-10-CM | POA: Diagnosis not present

## 2022-02-05 DIAGNOSIS — Z7984 Long term (current) use of oral hypoglycemic drugs: Secondary | ICD-10-CM | POA: Diagnosis not present

## 2022-02-05 DIAGNOSIS — I129 Hypertensive chronic kidney disease with stage 1 through stage 4 chronic kidney disease, or unspecified chronic kidney disease: Secondary | ICD-10-CM | POA: Diagnosis not present

## 2022-02-05 DIAGNOSIS — Z7902 Long term (current) use of antithrombotics/antiplatelets: Secondary | ICD-10-CM | POA: Diagnosis not present

## 2022-02-05 DIAGNOSIS — Z7985 Long-term (current) use of injectable non-insulin antidiabetic drugs: Secondary | ICD-10-CM | POA: Diagnosis not present

## 2022-02-05 DIAGNOSIS — J9601 Acute respiratory failure with hypoxia: Secondary | ICD-10-CM | POA: Diagnosis not present

## 2022-02-05 DIAGNOSIS — N1831 Chronic kidney disease, stage 3a: Secondary | ICD-10-CM | POA: Diagnosis not present

## 2022-02-05 DIAGNOSIS — E1122 Type 2 diabetes mellitus with diabetic chronic kidney disease: Secondary | ICD-10-CM | POA: Diagnosis not present

## 2022-02-05 DIAGNOSIS — Z8673 Personal history of transient ischemic attack (TIA), and cerebral infarction without residual deficits: Secondary | ICD-10-CM | POA: Diagnosis not present

## 2022-02-05 DIAGNOSIS — Z87891 Personal history of nicotine dependence: Secondary | ICD-10-CM | POA: Diagnosis not present

## 2022-02-05 DIAGNOSIS — E7800 Pure hypercholesterolemia, unspecified: Secondary | ICD-10-CM | POA: Diagnosis not present

## 2022-02-05 DIAGNOSIS — K219 Gastro-esophageal reflux disease without esophagitis: Secondary | ICD-10-CM | POA: Diagnosis not present

## 2022-02-05 DIAGNOSIS — K2970 Gastritis, unspecified, without bleeding: Secondary | ICD-10-CM | POA: Diagnosis not present

## 2022-02-05 DIAGNOSIS — R7401 Elevation of levels of liver transaminase levels: Secondary | ICD-10-CM | POA: Diagnosis not present

## 2022-02-16 ENCOUNTER — Encounter (HOSPITAL_COMMUNITY): Payer: Self-pay | Admitting: Internal Medicine

## 2022-02-16 ENCOUNTER — Other Ambulatory Visit (HOSPITAL_COMMUNITY): Payer: Self-pay | Admitting: Internal Medicine

## 2022-02-16 DIAGNOSIS — Z8673 Personal history of transient ischemic attack (TIA), and cerebral infarction without residual deficits: Secondary | ICD-10-CM | POA: Diagnosis not present

## 2022-02-16 DIAGNOSIS — E1165 Type 2 diabetes mellitus with hyperglycemia: Secondary | ICD-10-CM | POA: Diagnosis not present

## 2022-02-16 DIAGNOSIS — R7401 Elevation of levels of liver transaminase levels: Secondary | ICD-10-CM | POA: Diagnosis not present

## 2022-02-16 DIAGNOSIS — K219 Gastro-esophageal reflux disease without esophagitis: Secondary | ICD-10-CM | POA: Diagnosis not present

## 2022-02-16 DIAGNOSIS — Z87891 Personal history of nicotine dependence: Secondary | ICD-10-CM | POA: Diagnosis not present

## 2022-02-16 DIAGNOSIS — I129 Hypertensive chronic kidney disease with stage 1 through stage 4 chronic kidney disease, or unspecified chronic kidney disease: Secondary | ICD-10-CM | POA: Diagnosis not present

## 2022-02-16 DIAGNOSIS — E1122 Type 2 diabetes mellitus with diabetic chronic kidney disease: Secondary | ICD-10-CM | POA: Diagnosis not present

## 2022-02-16 DIAGNOSIS — K2970 Gastritis, unspecified, without bleeding: Secondary | ICD-10-CM | POA: Diagnosis not present

## 2022-02-16 DIAGNOSIS — Z7984 Long term (current) use of oral hypoglycemic drugs: Secondary | ICD-10-CM | POA: Diagnosis not present

## 2022-02-16 DIAGNOSIS — Z7985 Long-term (current) use of injectable non-insulin antidiabetic drugs: Secondary | ICD-10-CM | POA: Diagnosis not present

## 2022-02-16 DIAGNOSIS — I10 Essential (primary) hypertension: Secondary | ICD-10-CM | POA: Diagnosis not present

## 2022-02-16 DIAGNOSIS — J9601 Acute respiratory failure with hypoxia: Secondary | ICD-10-CM | POA: Diagnosis not present

## 2022-02-16 DIAGNOSIS — J189 Pneumonia, unspecified organism: Secondary | ICD-10-CM

## 2022-02-16 DIAGNOSIS — M79645 Pain in left finger(s): Secondary | ICD-10-CM

## 2022-02-16 DIAGNOSIS — Z7902 Long term (current) use of antithrombotics/antiplatelets: Secondary | ICD-10-CM | POA: Diagnosis not present

## 2022-02-16 DIAGNOSIS — N1831 Chronic kidney disease, stage 3a: Secondary | ICD-10-CM | POA: Diagnosis not present

## 2022-02-16 DIAGNOSIS — E7800 Pure hypercholesterolemia, unspecified: Secondary | ICD-10-CM | POA: Diagnosis not present

## 2022-02-16 DIAGNOSIS — N179 Acute kidney failure, unspecified: Secondary | ICD-10-CM | POA: Diagnosis not present

## 2022-02-16 DIAGNOSIS — R5383 Other fatigue: Secondary | ICD-10-CM | POA: Diagnosis not present

## 2022-02-19 ENCOUNTER — Encounter: Payer: Self-pay | Admitting: Pulmonary Disease

## 2022-02-20 ENCOUNTER — Ambulatory Visit (HOSPITAL_COMMUNITY)
Admission: RE | Admit: 2022-02-20 | Discharge: 2022-02-20 | Disposition: A | Discharge status: Home / Self Care | Payer: Medicare Other | Source: Ambulatory Visit | Attending: Internal Medicine | Admitting: Internal Medicine

## 2022-02-20 DIAGNOSIS — I10 Essential (primary) hypertension: Secondary | ICD-10-CM | POA: Diagnosis not present

## 2022-02-20 DIAGNOSIS — J189 Pneumonia, unspecified organism: Secondary | ICD-10-CM | POA: Diagnosis not present

## 2022-02-20 DIAGNOSIS — Z1810 Retained metal fragments, unspecified: Secondary | ICD-10-CM | POA: Diagnosis not present

## 2022-02-20 DIAGNOSIS — R5383 Other fatigue: Secondary | ICD-10-CM | POA: Diagnosis not present

## 2022-02-20 DIAGNOSIS — E118 Type 2 diabetes mellitus with unspecified complications: Secondary | ICD-10-CM | POA: Diagnosis not present

## 2022-02-20 DIAGNOSIS — E559 Vitamin D deficiency, unspecified: Secondary | ICD-10-CM | POA: Diagnosis not present

## 2022-02-20 DIAGNOSIS — M79645 Pain in left finger(s): Secondary | ICD-10-CM

## 2022-02-20 DIAGNOSIS — M19042 Primary osteoarthritis, left hand: Secondary | ICD-10-CM | POA: Diagnosis not present

## 2022-02-26 ENCOUNTER — Ambulatory Visit (INDEPENDENT_AMBULATORY_CARE_PROVIDER_SITE_OTHER): Payer: No Typology Code available for payment source

## 2022-02-26 VITALS — BP 118/68 | HR 82 | Temp 97.0°F | Resp 20 | Ht 70.0 in | Wt 173.2 lb

## 2022-02-26 DIAGNOSIS — E7800 Pure hypercholesterolemia, unspecified: Secondary | ICD-10-CM

## 2022-02-26 MED ORDER — INCLISIRAN SODIUM 284 MG/1.5ML ~~LOC~~ SOSY
284.0000 mg | PREFILLED_SYRINGE | Freq: Once | SUBCUTANEOUS | Status: AC
  Administered 2022-02-26: 284 mg via SUBCUTANEOUS
  Filled 2022-02-26: qty 1.5

## 2022-02-26 NOTE — Progress Notes (Signed)
Diagnosis: Hyperlipidemia  Provider:  Marshell Garfinkel MD  Procedure: Injection  Leqvio, Dose: 284 mg, Site: subcutaneous, Number of injections: 1  Post Care:n/a  Discharge: Condition: Good, Destination: Home . AVS Provided and AVS Declined  Performed by:  Cleophus Molt, RN

## 2022-03-09 DIAGNOSIS — M79645 Pain in left finger(s): Secondary | ICD-10-CM | POA: Diagnosis not present

## 2022-03-16 DIAGNOSIS — M2550 Pain in unspecified joint: Secondary | ICD-10-CM | POA: Diagnosis not present

## 2022-03-16 DIAGNOSIS — M7022 Olecranon bursitis, left elbow: Secondary | ICD-10-CM | POA: Diagnosis not present

## 2022-03-16 DIAGNOSIS — R5383 Other fatigue: Secondary | ICD-10-CM | POA: Diagnosis not present

## 2022-03-16 DIAGNOSIS — E559 Vitamin D deficiency, unspecified: Secondary | ICD-10-CM | POA: Diagnosis not present

## 2022-03-17 DIAGNOSIS — J9601 Acute respiratory failure with hypoxia: Secondary | ICD-10-CM | POA: Diagnosis not present

## 2022-03-25 DIAGNOSIS — E7800 Pure hypercholesterolemia, unspecified: Secondary | ICD-10-CM | POA: Diagnosis not present

## 2022-03-25 DIAGNOSIS — E1165 Type 2 diabetes mellitus with hyperglycemia: Secondary | ICD-10-CM | POA: Diagnosis not present

## 2022-03-30 DIAGNOSIS — M79645 Pain in left finger(s): Secondary | ICD-10-CM | POA: Diagnosis not present

## 2022-04-01 DIAGNOSIS — E7800 Pure hypercholesterolemia, unspecified: Secondary | ICD-10-CM | POA: Diagnosis not present

## 2022-04-01 DIAGNOSIS — E1165 Type 2 diabetes mellitus with hyperglycemia: Secondary | ICD-10-CM | POA: Diagnosis not present

## 2022-04-01 DIAGNOSIS — E0840 Diabetes mellitus due to underlying condition with diabetic neuropathy, unspecified: Secondary | ICD-10-CM | POA: Diagnosis not present

## 2022-04-01 DIAGNOSIS — I10 Essential (primary) hypertension: Secondary | ICD-10-CM | POA: Diagnosis not present

## 2022-04-01 DIAGNOSIS — Z8673 Personal history of transient ischemic attack (TIA), and cerebral infarction without residual deficits: Secondary | ICD-10-CM | POA: Diagnosis not present

## 2022-04-01 DIAGNOSIS — Z789 Other specified health status: Secondary | ICD-10-CM | POA: Diagnosis not present

## 2022-04-01 DIAGNOSIS — M79645 Pain in left finger(s): Secondary | ICD-10-CM | POA: Diagnosis not present

## 2022-04-01 DIAGNOSIS — M1A0420 Idiopathic chronic gout, left hand, without tophus (tophi): Secondary | ICD-10-CM | POA: Diagnosis not present

## 2022-04-01 DIAGNOSIS — I639 Cerebral infarction, unspecified: Secondary | ICD-10-CM | POA: Diagnosis not present

## 2022-04-08 DIAGNOSIS — M79645 Pain in left finger(s): Secondary | ICD-10-CM | POA: Diagnosis not present

## 2022-04-17 DIAGNOSIS — J9601 Acute respiratory failure with hypoxia: Secondary | ICD-10-CM | POA: Diagnosis not present

## 2022-04-22 DIAGNOSIS — M79645 Pain in left finger(s): Secondary | ICD-10-CM | POA: Diagnosis not present

## 2022-05-11 ENCOUNTER — Encounter (HOSPITAL_COMMUNITY): Payer: Self-pay | Admitting: Family Medicine

## 2022-05-11 ENCOUNTER — Other Ambulatory Visit (HOSPITAL_COMMUNITY): Payer: Self-pay | Admitting: Family Medicine

## 2022-05-11 DIAGNOSIS — J181 Lobar pneumonia, unspecified organism: Secondary | ICD-10-CM

## 2022-05-17 DIAGNOSIS — J9601 Acute respiratory failure with hypoxia: Secondary | ICD-10-CM | POA: Diagnosis not present

## 2022-06-01 ENCOUNTER — Other Ambulatory Visit (HOSPITAL_COMMUNITY): Payer: Self-pay | Admitting: Family Medicine

## 2022-06-01 DIAGNOSIS — R911 Solitary pulmonary nodule: Secondary | ICD-10-CM | POA: Diagnosis not present

## 2022-06-01 DIAGNOSIS — N281 Cyst of kidney, acquired: Secondary | ICD-10-CM | POA: Diagnosis not present

## 2022-06-01 DIAGNOSIS — Z0001 Encounter for general adult medical examination with abnormal findings: Secondary | ICD-10-CM | POA: Diagnosis not present

## 2022-06-10 ENCOUNTER — Encounter: Payer: Self-pay | Admitting: Pulmonary Disease

## 2022-06-17 DIAGNOSIS — J9601 Acute respiratory failure with hypoxia: Secondary | ICD-10-CM | POA: Diagnosis not present

## 2022-07-01 ENCOUNTER — Ambulatory Visit (HOSPITAL_COMMUNITY)
Admission: RE | Admit: 2022-07-01 | Discharge: 2022-07-01 | Disposition: A | Discharge status: Home / Self Care | Payer: Medicare Other | Source: Ambulatory Visit | Attending: Family Medicine | Admitting: Family Medicine

## 2022-07-01 DIAGNOSIS — N281 Cyst of kidney, acquired: Secondary | ICD-10-CM | POA: Diagnosis not present

## 2022-07-01 DIAGNOSIS — K869 Disease of pancreas, unspecified: Secondary | ICD-10-CM | POA: Diagnosis not present

## 2022-07-01 MED ORDER — GADOBUTROL 1 MMOL/ML IV SOLN
7.5000 mL | Freq: Once | INTRAVENOUS | Status: AC | PRN
  Administered 2022-07-01: 7.5 mL via INTRAVENOUS

## 2022-07-08 ENCOUNTER — Ambulatory Visit (HOSPITAL_COMMUNITY)
Admission: RE | Admit: 2022-07-08 | Discharge: 2022-07-08 | Disposition: A | Discharge status: Home / Self Care | Payer: Medicare Other | Source: Ambulatory Visit | Attending: Family Medicine | Admitting: Family Medicine

## 2022-07-08 DIAGNOSIS — J929 Pleural plaque without asbestos: Secondary | ICD-10-CM | POA: Diagnosis not present

## 2022-07-08 DIAGNOSIS — R911 Solitary pulmonary nodule: Secondary | ICD-10-CM | POA: Diagnosis not present

## 2022-07-08 DIAGNOSIS — J439 Emphysema, unspecified: Secondary | ICD-10-CM | POA: Diagnosis not present

## 2022-07-08 DIAGNOSIS — R918 Other nonspecific abnormal finding of lung field: Secondary | ICD-10-CM | POA: Diagnosis not present

## 2022-07-17 DIAGNOSIS — J9601 Acute respiratory failure with hypoxia: Secondary | ICD-10-CM | POA: Diagnosis not present

## 2022-07-27 ENCOUNTER — Other Ambulatory Visit: Payer: Self-pay

## 2022-07-27 ENCOUNTER — Inpatient Hospital Stay (HOSPITAL_COMMUNITY)
Admission: EM | Admit: 2022-07-27 | Discharge: 2022-07-29 | DRG: 728 | Disposition: A | Discharge status: Home / Self Care | Payer: Medicare Other | Attending: Family Medicine | Admitting: Family Medicine

## 2022-07-27 ENCOUNTER — Emergency Department (HOSPITAL_COMMUNITY): Payer: Medicare Other

## 2022-07-27 DIAGNOSIS — Z79899 Other long term (current) drug therapy: Secondary | ICD-10-CM

## 2022-07-27 DIAGNOSIS — Z888 Allergy status to other drugs, medicaments and biological substances status: Secondary | ICD-10-CM | POA: Diagnosis not present

## 2022-07-27 DIAGNOSIS — K219 Gastro-esophageal reflux disease without esophagitis: Secondary | ICD-10-CM | POA: Diagnosis not present

## 2022-07-27 DIAGNOSIS — E1165 Type 2 diabetes mellitus with hyperglycemia: Secondary | ICD-10-CM | POA: Diagnosis not present

## 2022-07-27 DIAGNOSIS — R918 Other nonspecific abnormal finding of lung field: Secondary | ICD-10-CM | POA: Diagnosis not present

## 2022-07-27 DIAGNOSIS — W57XXXA Bitten or stung by nonvenomous insect and other nonvenomous arthropods, initial encounter: Secondary | ICD-10-CM | POA: Diagnosis present

## 2022-07-27 DIAGNOSIS — Z8719 Personal history of other diseases of the digestive system: Secondary | ICD-10-CM

## 2022-07-27 DIAGNOSIS — R000 Tachycardia, unspecified: Secondary | ICD-10-CM | POA: Diagnosis not present

## 2022-07-27 DIAGNOSIS — N4822 Cellulitis of corpus cavernosum and penis: Secondary | ICD-10-CM | POA: Diagnosis not present

## 2022-07-27 DIAGNOSIS — N1831 Chronic kidney disease, stage 3a: Secondary | ICD-10-CM | POA: Diagnosis not present

## 2022-07-27 DIAGNOSIS — R519 Headache, unspecified: Secondary | ICD-10-CM | POA: Diagnosis not present

## 2022-07-27 DIAGNOSIS — R6510 Systemic inflammatory response syndrome (SIRS) of non-infectious origin without acute organ dysfunction: Secondary | ICD-10-CM | POA: Diagnosis present

## 2022-07-27 DIAGNOSIS — Z7984 Long term (current) use of oral hypoglycemic drugs: Secondary | ICD-10-CM | POA: Diagnosis not present

## 2022-07-27 DIAGNOSIS — Z1152 Encounter for screening for COVID-19: Secondary | ICD-10-CM | POA: Diagnosis not present

## 2022-07-27 DIAGNOSIS — Z87891 Personal history of nicotine dependence: Secondary | ICD-10-CM

## 2022-07-27 DIAGNOSIS — N451 Epididymitis: Secondary | ICD-10-CM | POA: Diagnosis present

## 2022-07-27 DIAGNOSIS — E872 Acidosis, unspecified: Secondary | ICD-10-CM | POA: Diagnosis present

## 2022-07-27 DIAGNOSIS — I129 Hypertensive chronic kidney disease with stage 1 through stage 4 chronic kidney disease, or unspecified chronic kidney disease: Secondary | ICD-10-CM | POA: Diagnosis not present

## 2022-07-27 DIAGNOSIS — E7800 Pure hypercholesterolemia, unspecified: Secondary | ICD-10-CM | POA: Diagnosis not present

## 2022-07-27 DIAGNOSIS — E1122 Type 2 diabetes mellitus with diabetic chronic kidney disease: Secondary | ICD-10-CM | POA: Diagnosis present

## 2022-07-27 DIAGNOSIS — M3313 Other dermatomyositis without myopathy: Secondary | ICD-10-CM | POA: Diagnosis not present

## 2022-07-27 DIAGNOSIS — Z823 Family history of stroke: Secondary | ICD-10-CM | POA: Diagnosis not present

## 2022-07-27 DIAGNOSIS — Z8701 Personal history of pneumonia (recurrent): Secondary | ICD-10-CM

## 2022-07-27 DIAGNOSIS — Z7985 Long-term (current) use of injectable non-insulin antidiabetic drugs: Secondary | ICD-10-CM

## 2022-07-27 DIAGNOSIS — Z8673 Personal history of transient ischemic attack (TIA), and cerebral infarction without residual deficits: Secondary | ICD-10-CM | POA: Diagnosis not present

## 2022-07-27 DIAGNOSIS — R531 Weakness: Secondary | ICD-10-CM | POA: Diagnosis not present

## 2022-07-27 DIAGNOSIS — Z8619 Personal history of other infectious and parasitic diseases: Secondary | ICD-10-CM

## 2022-07-27 DIAGNOSIS — I10 Essential (primary) hypertension: Secondary | ICD-10-CM

## 2022-07-27 DIAGNOSIS — R509 Fever, unspecified: Secondary | ICD-10-CM | POA: Diagnosis not present

## 2022-07-27 DIAGNOSIS — Z7902 Long term (current) use of antithrombotics/antiplatelets: Secondary | ICD-10-CM

## 2022-07-27 LAB — RESP PANEL BY RT-PCR (RSV, FLU A&B, COVID)  RVPGX2
Influenza A by PCR: NEGATIVE
Influenza B by PCR: NEGATIVE
Resp Syncytial Virus by PCR: NEGATIVE
SARS Coronavirus 2 by RT PCR: NEGATIVE

## 2022-07-27 LAB — URINALYSIS, W/ REFLEX TO CULTURE (INFECTION SUSPECTED)
Bacteria, UA: NONE SEEN
Bilirubin Urine: NEGATIVE
Glucose, UA: NEGATIVE mg/dL
Hgb urine dipstick: NEGATIVE
Ketones, ur: NEGATIVE mg/dL
Leukocytes,Ua: NEGATIVE
Nitrite: NEGATIVE
Protein, ur: NEGATIVE mg/dL
Specific Gravity, Urine: 1.006 (ref 1.005–1.030)
pH: 6 (ref 5.0–8.0)

## 2022-07-27 LAB — CBC WITH DIFFERENTIAL/PLATELET
Abs Immature Granulocytes: 0.11 10*3/uL — ABNORMAL HIGH (ref 0.00–0.07)
Basophils Absolute: 0 10*3/uL (ref 0.0–0.1)
Basophils Relative: 0 %
Eosinophils Absolute: 0 10*3/uL (ref 0.0–0.5)
Eosinophils Relative: 0 %
HCT: 44 % (ref 39.0–52.0)
Hemoglobin: 14.8 g/dL (ref 13.0–17.0)
Immature Granulocytes: 1 %
Lymphocytes Relative: 4 %
Lymphs Abs: 0.8 10*3/uL (ref 0.7–4.0)
MCH: 31 pg (ref 26.0–34.0)
MCHC: 33.6 g/dL (ref 30.0–36.0)
MCV: 92.2 fL (ref 80.0–100.0)
Monocytes Absolute: 1 10*3/uL (ref 0.1–1.0)
Monocytes Relative: 5 %
Neutro Abs: 18.9 10*3/uL — ABNORMAL HIGH (ref 1.7–7.7)
Neutrophils Relative %: 90 %
Platelets: 166 10*3/uL (ref 150–400)
RBC: 4.77 MIL/uL (ref 4.22–5.81)
RDW: 15.2 % (ref 11.5–15.5)
WBC: 20.8 10*3/uL — ABNORMAL HIGH (ref 4.0–10.5)
nRBC: 0 % (ref 0.0–0.2)

## 2022-07-27 LAB — COMPREHENSIVE METABOLIC PANEL
ALT: 31 U/L (ref 0–44)
AST: 25 U/L (ref 15–41)
Albumin: 3.9 g/dL (ref 3.5–5.0)
Alkaline Phosphatase: 65 U/L (ref 38–126)
Anion gap: 12 (ref 5–15)
BUN: 26 mg/dL — ABNORMAL HIGH (ref 8–23)
CO2: 20 mmol/L — ABNORMAL LOW (ref 22–32)
Calcium: 8.9 mg/dL (ref 8.9–10.3)
Chloride: 100 mmol/L (ref 98–111)
Creatinine, Ser: 1.11 mg/dL (ref 0.61–1.24)
GFR, Estimated: >60 mL/min (ref >60)
Glucose, Bld: 184 mg/dL — ABNORMAL HIGH (ref 70–99)
Potassium: 4.1 mmol/L (ref 3.5–5.1)
Sodium: 132 mmol/L — ABNORMAL LOW (ref 135–145)
Total Bilirubin: 1 mg/dL (ref 0.3–1.2)
Total Protein: 7 g/dL (ref 6.5–8.1)

## 2022-07-27 LAB — APTT: aPTT: 28 seconds (ref 24–36)

## 2022-07-27 LAB — PROTIME-INR
INR: 1.2 (ref 0.8–1.2)
Prothrombin Time: 15.1 seconds (ref 11.4–15.2)

## 2022-07-27 LAB — LACTIC ACID, PLASMA
Lactic Acid, Venous: 2.5 mmol/L (ref 0.5–1.9)
Lactic Acid, Venous: 2.9 mmol/L (ref 0.5–1.9)

## 2022-07-27 MED ORDER — VANCOMYCIN HCL IN DEXTROSE 1-5 GM/200ML-% IV SOLN: 1000.0000 mg | Freq: Once | INTRAVENOUS | Status: DC

## 2022-07-27 MED ORDER — ACETAMINOPHEN 325 MG PO TABS
650.0000 mg | ORAL_TABLET | Freq: Once | ORAL | Status: AC
  Administered 2022-07-27: 650 mg via ORAL
  Filled 2022-07-27: qty 2

## 2022-07-27 MED ORDER — SODIUM CHLORIDE 0.9 % IV SOLN
2.0000 g | Freq: Once | INTRAVENOUS | Status: AC
  Administered 2022-07-27: 2 g via INTRAVENOUS
  Filled 2022-07-27: qty 12.5

## 2022-07-27 MED ORDER — LACTATED RINGERS IV BOLUS (SEPSIS)
1000.0000 mL | Freq: Once | INTRAVENOUS | Status: AC
  Administered 2022-07-27: 1000 mL via INTRAVENOUS

## 2022-07-27 MED ORDER — METRONIDAZOLE 500 MG/100ML IV SOLN
500.0000 mg | Freq: Once | INTRAVENOUS | Status: AC
  Administered 2022-07-27: 500 mg via INTRAVENOUS
  Filled 2022-07-27: qty 100

## 2022-07-27 MED ORDER — LACTATED RINGERS IV BOLUS (SEPSIS)
1350.0000 mL | Freq: Once | INTRAVENOUS | Status: AC
  Administered 2022-07-27: 1350 mL via INTRAVENOUS

## 2022-07-27 MED ORDER — VANCOMYCIN HCL 1500 MG/300ML IV SOLN
1500.0000 mg | Freq: Once | INTRAVENOUS | Status: AC
  Administered 2022-07-28: 1500 mg via INTRAVENOUS
  Filled 2022-07-27: qty 300

## 2022-07-27 MED ORDER — LACTATED RINGERS IV SOLN: INTRAVENOUS | Status: DC

## 2022-07-27 NOTE — Sepsis Progress Note (Signed)
Elink monitoring for the code sepsis protocol.  

## 2022-07-27 NOTE — ED Provider Notes (Signed)
[place] AT [facility] Provider Note   CSN: [id number] Arrival date & time: 07/27/22  1956     History {Add pertinent medical, surgical, social history, OB history to HPI:1} Chief Complaint  Patient presents with   Fever    David Pruitt is a 77 y.o. male.  He is brought in by his wife for evaluation of fever malaise.  He said he started with some headache and pain behind his eyes last evening.  Today he has been sleeping most of the day and when his wife woke him up he was unable to get up due to some general weakness.  She checked his temperature and found it to be 101.  She said he gets sick quickly and he was admitted in January for septic shock from pneumonia.  He denies any cough shortness of breath abdominal pain vomiting diarrhea urinary symptoms.  No recent travel.  His son just caught COVID although they have not seen him.  The history is provided by the patient and the spouse.  Fever Max temp prior to arrival:  101 Temp source:  Oral Duration:  1 day Timing:  Constant Progression:  Unchanged Chronicity:  New Relieved by:  None tried Worsened by:  Nothing Ineffective treatments:  None tried Associated symptoms: headaches   Associated symptoms: no chest pain, no cough, no diarrhea, no dysuria, no nausea and no vomiting        Home Medications Prior to Admission medications   Medication Sig Start Date End Date Taking? Authorizing Provider  Apple Cider Vinegar 500 MG TABS Take 1 tablet by mouth daily.    [provider]  ascorbic acid (VITAMIN C) 1000 MG tablet Take 1,000 mg by mouth in the morning and at bedtime.    [provider]  clopidogrel (PLAVIX) 75 MG tablet Take 1 tablet (75 mg total) by mouth daily. 08/19/20   Vassie Loll, MD  Coenzyme Q10-Fish Oil-Vit E (CO-Q 10 OMEGA-3 FISH OIL) CAPS Take 1 capsule by mouth daily.    [provider]  dextromethorphan (DELSYM) 30 MG/5ML liquid Take 5 mLs (30  mg total) by mouth 2 (two) times daily as needed for cough. 01/16/22   Johnson, Clanford L, MD  gabapentin (NEURONTIN) 100 MG capsule Take 100 mg by mouth 3 (three) times daily. 10/22/21   [provider]  glipiZIDE (GLUCOTROL XL) 5 MG 24 hr tablet Take 5 mg by mouth daily.    [provider]  glucosamine-chondroitin 500-400 MG tablet Take 1 tablet by mouth daily.    [provider]  lisinopril (PRINIVIL,ZESTRIL) 10 MG tablet Take 10 mg by mouth daily.    [provider]  loperamide (IMODIUM) 2 MG capsule Take by mouth as needed for diarrhea or loose stools.    [provider]  loratadine (CLARITIN) 10 MG tablet Take 10 mg daily by mouth.    [provider]  Magnesium 250 MG TABS Take 1 tablet by mouth daily.    [provider]  metFORMIN (GLUCOPHAGE-XR) 500 MG 24 hr tablet Take 500 mg by mouth daily. 10/28/21   [provider]  metoprolol succinate (TOPROL-XL) 50 MG 24 hr tablet Take 50 mg by mouth daily. 10/23/19   [provider]  Multiple Vitamins-Minerals (MULTIVITAMIN WITH MINERALS) tablet Take 1 tablet by mouth daily.    [provider]  pantoprazole (PROTONIX) 40 MG tablet Take 1 tablet (40 mg total) by mouth daily. 04/29/21   Rourk, Gerrit Friends,  MD  Probiotic, Lactobacillus, CAPS Take 1 capsule by mouth daily.    [provider]  Semaglutide, 2 MG/DOSE, (OZEMPIC, 2 MG/DOSE,) 8 MG/3ML SOPN Inject 2 mg into the skin once a week. 10/02/21     Turmeric Curcumin 500 MG CAPS Take 1 capsule by mouth daily.    [provider]  vitamin B-12 (CYANOCOBALAMIN) 1000 MCG tablet Take 1,000 mcg daily by mouth.    [provider]      Allergies    Dapagliflozin and Statins    Review of Systems   Review of Systems  Constitutional:  Positive for fatigue and fever.  Respiratory:  Negative for cough.   Cardiovascular:  Negative for chest pain.  Gastrointestinal:  Negative for diarrhea, nausea  and vomiting.  Genitourinary:  Negative for dysuria.  Musculoskeletal:  Positive for arthralgias.  Neurological:  Positive for headaches.    Physical Exam Updated Vital Signs BP 113/70   Pulse (!) 106   Temp 100.1 F (37.8 C) (Oral)   Resp (!) 25   SpO2 95%  Physical Exam Vitals and nursing note reviewed.  Constitutional:      General: He is not in acute distress.    Appearance: Normal appearance. He is well-developed.  HENT:     Head: Normocephalic and atraumatic.  Eyes:     Conjunctiva/sclera: Conjunctivae normal.  Cardiovascular:     Rate and Rhythm: Normal rate and regular rhythm.     Heart sounds: No murmur heard. Pulmonary:     Effort: Pulmonary effort is normal. No respiratory distress.     Breath sounds: Normal breath sounds.  Abdominal:     Palpations: Abdomen is soft.     Tenderness: There is no abdominal tenderness. There is no guarding or rebound.  Musculoskeletal:        General: No deformity. Normal range of motion.     Cervical back: Neck supple.     Right lower leg: No edema.     Left lower leg: No edema.  Skin:    General: Skin is warm and dry.     Capillary Refill: Capillary refill takes less than 2 seconds.  Neurological:     General: No focal deficit present.     Mental Status: He is alert.     Motor: No weakness.     ED Results / Procedures / Treatments   Labs (all labs ordered are listed, but only abnormal results are displayed) Labs Reviewed  RESP PANEL BY RT-PCR (RSV, FLU A&B, COVID)  [id number]  CULTURE, BLOOD (ROUTINE X 2)  CULTURE, BLOOD (ROUTINE X 2)  LACTIC ACID, PLASMA  LACTIC ACID, PLASMA  COMPREHENSIVE METABOLIC PANEL  CBC WITH DIFFERENTIAL/PLATELET  PROTIME-INR  APTT  URINALYSIS, W/ REFLEX TO CULTURE (INFECTION SUSPECTED)    EKG None  Radiology No results found.  Procedures Procedures  {Document cardiac monitor, telemetry assessment procedure when appropriate:1}  Medications Ordered in ED Medications  lactated  ringers bolus 1,000 mL (has no administration in time range)  acetaminophen (TYLENOL) tablet 650 mg (has no administration in time range)    ED Course/ Medical Decision Making/ A&P   {   Click here for ABCD2, HEART and other calculatorsREFRESH Note before signing :1}                          Medical Decision Making Amount and/or Complexity of Data Reviewed Labs: ordered. Radiology: ordered.  Risk OTC drugs.   This patient complains of ***;  this involves an extensive number of treatment Options and is a complaint that carries with it a high risk of complications and morbidity. The differential includes ***  I ordered, reviewed and interpreted labs, which included *** I ordered medication *** and reviewed PMP when indicated. I ordered imaging studies which included *** and I independently    visualized and interpreted imaging which showed *** Additional history obtained from *** Previous records obtained and reviewed *** I consulted *** and discussed lab and imaging findings and discussed disposition.  Cardiac monitoring reviewed, *** Social determinants considered, *** Critical Interventions: ***  After the interventions stated above, I reevaluated the patient and found *** Admission and further testing considered, ***   {Document critical care time when appropriate:1} {Document review of labs and clinical decision tools ie heart score, Chads2Vasc2 etc:1}  {Document your independent review of radiology images, and any outside records:1} {Document your discussion with family members, caretakers, and with consultants:1} {Document social determinants of health affecting pt's care:1} {Document your decision making why or why not admission, treatments were needed:1} Final Clinical Impression(s) / ED Diagnoses Final diagnoses:  None    Rx / DC Orders ED Discharge Orders     None

## 2022-07-27 NOTE — ED Triage Notes (Signed)
Pt presents with overall complaints of not feeling good. Started this morning and fevers today. Endorses weakness, eye pain, headache.   Pt 100.1 ion triage. Tylenol taken at 6pm tonight.

## 2022-07-27 NOTE — H&P (Signed)
History and Physical    Patient: David Pruitt ZHY:[id number] DOB: 06-29-1945 DOA: 07/27/2022 DOS: the patient was seen and examined on 07/28/2022 PCP: Assunta Found, MD  Patient coming from: Home  Chief Complaint:  Chief Complaint  Patient presents with   Fever   HPI: David Pruitt is a 77 y.o. male with medical history significant of hypertension, type 2 diabetes mellitus, GERD who presents to the emergency department accompanied by wife due to not feeling well which started in the evening yesterday, this started with headache and pain behind his eyes..  Today, he went to [facility] for a doctor's appointment with his wife, on returning home, he has been sleeping most of the day and when wife woke him up, he was so weak that he was unable to get out of bed.  Temperature was checked and was noted to be 101F.  Patient denies cough, shortness of breath, abdominal pain, chest pain, urinary symptoms.  He denies any recent travel or sick contacts.  Son recently had COVID, but he has not seen him not had any direct contact with him.  ED Course:  In the emergency department, temperature was 100.1 F, respiratory rate 25/min, pulse 106 bpm, BP 113/70, O2 sat was 95% on room air.  Workup in the ED showed normal CBC except WBC of 20.82 with left shift.  BMP showed sodium 132, potassium 4.1, chloride 100, bicarb 20, glucose 184, BUN 26, creatinine 1.11.  Urinalysis was normal, lactic acid 2.5 > 2.9.  Influenza A, B, SARS coronavirus 2, RSV was negative.  Blood culture pending. Chest x-ray showed no evidence of active pulmonary disease.  Slight interstitial pattern to the lungs, likely chronic interstitial lung disease. Patient was treated with vancomycin, Flagyl, cefepime.  IV hydration was provided. Hospitalist was asked to admit patient for further evaluation and management.  Review of Systems: Review of systems as noted in the HPI. All other systems reviewed and are negative.   Past Medical  History:  Diagnosis Date   CKD (chronic kidney disease), stage IIIa (HCC) 08/15/2020   CVA (cerebral infarction)    2006   DM (dermatomyositis)    Gastritis    GERD (gastroesophageal reflux disease)    Hemorrhoids    Hypercholesteremia    Hypertension    Lower GI bleed    SECONDARY TO DIVERTICULA,S/P BLEED   Overweight (BMI 25.0-29.9) 08/15/2020   Shortness of breath dyspnea    Type 2 diabetes mellitus (HCC)    Past Surgical History:  Procedure Laterality Date   COLONOSCOPY  12/2009   RMR: Lower GI bleed due to a single diverticulum status post Endo clipping.  Scattered diverticulosis.   COLONOSCOPY N/A 05/24/2014   Procedure: COLONOSCOPY;  Surgeon: Malissa Hippo, MD;  Location: [facility];  Service: Endoscopy;  Laterality: N/A;  1030   COLONOSCOPY N/A 03/15/2019   Diverticulosis, 5 polyps removed.  Pathology revealed tubular adenomas.  Next colonoscopy in 3 years.   COLONOSCOPY WITH PROPOFOL N/A 08/16/2020   Procedure: COLONOSCOPY WITH PROPOFOL;  Surgeon: Malissa Hippo, MD;  [facility]: [facility];  Service: Endoscopy;  Laterality: N/A;   ESOPHAGOGASTRODUODENOSCOPY  12/2009   RMR: Mild esophagitis, gastritis, duodenitis.  Gastric biopsies negative for H. pylori   ESOPHAGOGASTRODUODENOSCOPY (EGD) WITH PROPOFOL N/A 08/15/2020   Procedure: ESOPHAGOGASTRODUODENOSCOPY (EGD) WITH PROPOFOL;  Surgeon: Corbin Ade, MD;  [facility];  Service: Endoscopy;  Laterality: N/A;  covid + (asymptomatic)   GIVENS CAPSULE STUDY N/A 08/29/2020  Procedure: GIVENS CAPSULE STUDY;  Surgeon: Malissa Hippo, MD;  [facility];  Service: Endoscopy;  Laterality: N/A;   POLYPECTOMY  03/15/2019   Procedure: POLYPECTOMY;  Surgeon: Corbin Ade, MD;  [facility]: [facility];  Service: Endoscopy;;    Social History:  reports that he has quit smoking. His smoking use included cigarettes. He has a 40 pack-year smoking history. He has never used smokeless tobacco. He reports current  alcohol use. He reports that he does not use drugs.   Allergies  Allergen Reactions   Dapagliflozin     Other reaction(s): Yeast infection   Statins     Other reaction(s): Unknown    Family History  Problem Relation Age of Onset   Colon cancer Father    Stroke Father      Prior to Admission medications   Medication Sig Start Date End Date Taking? Authorizing Provider  Apple Cider Vinegar 500 MG TABS Take 1 tablet by mouth daily.    [provider]  ascorbic acid (VITAMIN C) 1000 MG tablet Take 1,000 mg by mouth in the morning and at bedtime.    [provider]  clopidogrel (PLAVIX) 75 MG tablet Take 1 tablet (75 mg total) by mouth daily. 08/19/20   Vassie Loll, MD  Coenzyme Q10-Fish Oil-Vit E (CO-Q 10 OMEGA-3 FISH OIL) CAPS Take 1 capsule by mouth daily.    [provider]  dextromethorphan (DELSYM) 30 MG/5ML liquid Take 5 mLs (30 mg total) by mouth 2 (two) times daily as needed for cough. 01/16/22   Johnson, Clanford L, MD  gabapentin (NEURONTIN) 100 MG capsule Take 100 mg by mouth 3 (three) times daily. 10/22/21   [provider]  glipiZIDE (GLUCOTROL XL) 5 MG 24 hr tablet Take 5 mg by mouth daily.    [provider]  glucosamine-chondroitin 500-400 MG tablet Take 1 tablet by mouth daily.    [provider]  lisinopril (PRINIVIL,ZESTRIL) 10 MG tablet Take 10 mg by mouth daily.    [provider]  loperamide (IMODIUM) 2 MG capsule Take by mouth as needed for diarrhea or loose stools.    [provider]  loratadine (CLARITIN) 10 MG tablet Take 10 mg daily by mouth.    [provider]  Magnesium 250 MG TABS Take 1 tablet by mouth daily.    [provider]  metFORMIN (GLUCOPHAGE-XR) 500 MG 24 hr tablet Take 500 mg by mouth daily. 10/28/21   [provider]  metoprolol succinate (TOPROL-XL) 50 MG 24 hr tablet Take 50 mg by mouth daily. 10/23/19   [provider]  Multiple  Vitamins-Minerals (MULTIVITAMIN WITH MINERALS) tablet Take 1 tablet by mouth daily.    [provider]  pantoprazole (PROTONIX) 40 MG tablet Take 1 tablet (40 mg total) by mouth daily. 04/29/21   Rourk, Gerrit Friends, MD  Probiotic, Lactobacillus, CAPS Take 1 capsule by mouth daily.    [provider]  Semaglutide, 2 MG/DOSE, (OZEMPIC, 2 MG/DOSE,) 8 MG/3ML SOPN Inject 2 mg into the skin once a week. 10/02/21     Turmeric Curcumin 500 MG CAPS Take 1 capsule by mouth daily.    [provider]  vitamin B-12 (CYANOCOBALAMIN) 1000 MCG tablet Take 1,000 mcg daily by mouth.    [provider]    Physical Exam: BP 111/67 (BP Location: Left Arm)   Pulse 95   Temp (!) 101.1 F (38.4 C) (Oral)   Resp 12   Ht 5\' 10"  (1.778  m)   Wt 77 kg   SpO2 100%   BMI 24.36 kg/m   General: 77 y.o. year-old male well developed well nourished in no acute distress.  Alert and oriented x3. HEENT: NCAT, EOMI Neck: Supple, trachea medial Cardiovascular: Regular rate and rhythm with no rubs or gallops.  No thyromegaly or JVD noted.  No lower extremity edema. 2/4 pulses in all 4 extremities. Respiratory: Clear to auscultation with no wheezes or rales. Good inspiratory effort. Abdomen: Soft, nontender nondistended with normal bowel sounds x4 quadrants. Muskuloskeletal: No cyanosis, clubbing or edema noted bilaterally Neuro: CN II-XII intact, strength 5/5 x 4, sensation, reflexes intact Skin: No ulcerative lesions noted or rashes Psychiatry: Judgement and insight appear normal. Mood is appropriate for condition and setting          Labs on Admission:  Basic Metabolic Panel: Recent Labs  Lab 07/27/22 2110  NA 132*  K 4.1  CL 100  CO2 20*  GLUCOSE 184*  BUN 26*  CREATININE 1.11  CALCIUM 8.9   Liver Function Tests: Recent Labs  Lab 07/27/22 2110  AST 25  ALT 31  ALKPHOS 65  BILITOT 1.0  PROT 7.0  ALBUMIN 3.9   No results for input(s): "LIPASE", "AMYLASE" in the last  168 hours. No results for input(s): "AMMONIA" in the last 168 hours. CBC: Recent Labs  Lab 07/27/22 2110  WBC 20.8*  NEUTROABS 18.9*  HGB 14.8  HCT 44.0  MCV 92.2  PLT 166   Cardiac Enzymes: No results for input(s): "CKTOTAL", "CKMB", "CKMBINDEX", "TROPONINI" in the last 168 hours.  BNP (last 3 results) No results for input(s): "BNP" in the last 8760 hours.  ProBNP (last 3 results) No results for input(s): "PROBNP" in the last 8760 hours.  CBG: No results for input(s): "GLUCAP" in the last 168 hours.  Radiological Exams on Admission: DG Chest Port 1 View  Result Date: 07/27/2022 CLINICAL DATA:  Question of sepsis.  Fever, weakness, and headache. EXAM: PORTABLE CHEST 1 VIEW COMPARISON:  02/20/2022 FINDINGS: Heart size and pulmonary vascularity are normal. Scattered interstitial changes in the lungs, likely fibrosis. No airspace disease or consolidation. No pleural effusions. No pneumothorax. Mediastinal contours appear intact. Calcification of the aorta. IMPRESSION: No evidence of active pulmonary disease. Slight interstitial pattern to the lungs, likely chronic interstitial lung disease. Electronically Signed   By: Burman Nieves M.D.   On: 07/27/2022 21:34    EKG: I independently viewed the EKG done and my findings are as followed: Sinus tachycardia with rate of 105 beats per minute  Assessment/Plan Present on Admission:  SIRS (systemic inflammatory response syndrome) (HCC)  Lactic acidosis  Type 2 diabetes mellitus with hyperglycemia (HCC)  Essential hypertension  GERD without esophagitis  Principal Problem:   SIRS (systemic inflammatory response syndrome) (HCC) Active Problems:   GERD without esophagitis   Essential hypertension   Type 2 diabetes mellitus with hyperglycemia (HCC)   Lactic acidosis  SIRS with suspicion for sepsis Patient was tachypneic, tachycardic, febrile and has leukocytosis with left shift (meets SIRS criteria). No known source of infection  at this time Patient was empirically treated with IV vancomycin, metronidazole and cefepime, we shall continue with IV vancomycin and cefepime with plan to de-escalate/discontinue based on blood culture Respiratory panel will be checked Procalcitonin pending Continue IV hydration Continue Tylenol as needed  Lactic acidosis in the setting of above Lactic acid 2.5 > 2.9 Continue to trend lactic acid  T2DM with hyperglycemia Continue ISS and hypoglycemia protocol Metformin and  Glipizide will be held at this time  Essential hypertension BP meds will be held at this time due to soft BP  GERD Continue Protonix   DVT prophylaxis: Lovenox   Advance Care Planning: Full code  Consults: None  Family Communication: Wife at bedside (all questions answered to satisfaction)  Severity of Illness: The appropriate patient status for this patient is INPATIENT. Inpatient status is judged to be reasonable and necessary in order to provide the required intensity of service to ensure the patient's safety. The patient's presenting symptoms, physical exam findings, and initial radiographic and laboratory data in the context of their chronic comorbidities is felt to place them at high risk for further clinical deterioration. Furthermore, it is not anticipated that the patient will be medically stable for discharge from the hospital within 2 midnights of admission.   * I certify that at the point of admission it is my clinical judgment that the patient will require inpatient hospital care spanning beyond 2 midnights from the point of admission due to high intensity of service, high risk for further deterioration and high frequency of surveillance required.*  Author: Frankey Shown, DO 07/28/2022 2:59 AM  For on call review [email or web address].

## 2022-07-27 NOTE — ED Notes (Signed)
Aware of need for urine sample, urinal at bedside 

## 2022-07-28 DIAGNOSIS — R6510 Systemic inflammatory response syndrome (SIRS) of non-infectious origin without acute organ dysfunction: Secondary | ICD-10-CM

## 2022-07-28 LAB — COMPREHENSIVE METABOLIC PANEL
ALT: 28 U/L (ref 0–44)
AST: 26 U/L (ref 15–41)
Albumin: 3.3 g/dL — ABNORMAL LOW (ref 3.5–5.0)
Alkaline Phosphatase: 54 U/L (ref 38–126)
Anion gap: 8 (ref 5–15)
BUN: 22 mg/dL (ref 8–23)
CO2: 23 mmol/L (ref 22–32)
Calcium: 8.8 mg/dL — ABNORMAL LOW (ref 8.9–10.3)
Chloride: 107 mmol/L (ref 98–111)
Creatinine, Ser: 0.97 mg/dL (ref 0.61–1.24)
GFR, Estimated: >60 mL/min (ref >60)
Glucose, Bld: 77 mg/dL (ref 70–99)
Potassium: 3.6 mmol/L (ref 3.5–5.1)
Sodium: 138 mmol/L (ref 135–145)
Total Bilirubin: 1 mg/dL (ref 0.3–1.2)
Total Protein: 6 g/dL — ABNORMAL LOW (ref 6.5–8.1)

## 2022-07-28 LAB — GLUCOSE, CAPILLARY
Glucose-Capillary: 74 mg/dL (ref 70–99)
Glucose-Capillary: 145 mg/dL — ABNORMAL HIGH (ref 70–99)
Glucose-Capillary: 81 mg/dL (ref 70–99)
Glucose-Capillary: 213 mg/dL — ABNORMAL HIGH (ref 70–99)

## 2022-07-28 LAB — CBC
HCT: 38.6 % — ABNORMAL LOW (ref 39.0–52.0)
Hemoglobin: 13 g/dL (ref 13.0–17.0)
MCH: 31.4 pg (ref 26.0–34.0)
MCHC: 33.7 g/dL (ref 30.0–36.0)
MCV: 93.2 fL (ref 80.0–100.0)
Platelets: 147 10*3/uL — ABNORMAL LOW (ref 150–400)
RBC: 4.14 MIL/uL — ABNORMAL LOW (ref 4.22–5.81)
RDW: 15.4 % (ref 11.5–15.5)
WBC: 15.5 10*3/uL — ABNORMAL HIGH (ref 4.0–10.5)
nRBC: 0 % (ref 0.0–0.2)

## 2022-07-28 LAB — HEMOGLOBIN A1C
Hgb A1c MFr Bld: 7.1 % — ABNORMAL HIGH (ref 4.8–5.6)
Mean Plasma Glucose: 157.07 mg/dL

## 2022-07-28 LAB — PHOSPHORUS: Phosphorus: 2.9 mg/dL (ref 2.5–4.6)

## 2022-07-28 LAB — PROCALCITONIN: Procalcitonin: 0.25 ng/mL

## 2022-07-28 LAB — LACTIC ACID, PLASMA
Lactic Acid, Venous: 2.8 mmol/L (ref 0.5–1.9)
Lactic Acid, Venous: 1.6 mmol/L (ref 0.5–1.9)

## 2022-07-28 LAB — MRSA NEXT GEN BY PCR, NASAL: MRSA by PCR Next Gen: NOT DETECTED

## 2022-07-28 LAB — MAGNESIUM: Magnesium: 1.7 mg/dL (ref 1.7–2.4)

## 2022-07-28 MED ORDER — SODIUM CHLORIDE 0.9 % IV SOLN
2.0000 g | Freq: Two times a day (BID) | INTRAVENOUS | Status: DC
  Administered 2022-07-28: 2 g via INTRAVENOUS
  Filled 2022-07-28: qty 12.5

## 2022-07-28 MED ORDER — PANTOPRAZOLE SODIUM 40 MG PO TBEC
40.0000 mg | DELAYED_RELEASE_TABLET | Freq: Every day | ORAL | Status: DC
  Administered 2022-07-28 – 2022-07-29 (×2): 40 mg via ORAL
  Filled 2022-07-28 (×2): qty 1

## 2022-07-28 MED ORDER — SODIUM CHLORIDE 0.9 % IV SOLN
2.0000 g | Freq: Three times a day (TID) | INTRAVENOUS | Status: DC
  Administered 2022-07-28 (×2): 2 g via INTRAVENOUS
  Filled 2022-07-28 (×2): qty 12.5

## 2022-07-28 MED ORDER — VANCOMYCIN HCL 1500 MG/300ML IV SOLN
1500.0000 mg | INTRAVENOUS | Status: DC
  Administered 2022-07-28: 1500 mg via INTRAVENOUS
  Filled 2022-07-28: qty 300

## 2022-07-28 MED ORDER — ACETAMINOPHEN 650 MG RE SUPP: 650.0000 mg | Freq: Four times a day (QID) | RECTAL | Status: DC | PRN

## 2022-07-28 MED ORDER — SODIUM CHLORIDE 0.9 % IV SOLN: INTRAVENOUS | Status: AC

## 2022-07-28 MED ORDER — VANCOMYCIN HCL 750 MG/150ML IV SOLN
750.0000 mg | Freq: Two times a day (BID) | INTRAVENOUS | Status: DC
  Filled 2022-07-28: qty 150

## 2022-07-28 MED ORDER — ACETAMINOPHEN 325 MG PO TABS
650.0000 mg | ORAL_TABLET | Freq: Four times a day (QID) | ORAL | Status: DC | PRN
  Administered 2022-07-28 (×2): 650 mg via ORAL
  Filled 2022-07-28 (×2): qty 2

## 2022-07-28 MED ORDER — ONDANSETRON HCL 4 MG/2ML IJ SOLN: 4.0000 mg | Freq: Four times a day (QID) | INTRAMUSCULAR | Status: DC | PRN

## 2022-07-28 MED ORDER — ENOXAPARIN SODIUM 40 MG/0.4ML IJ SOSY
40.0000 mg | PREFILLED_SYRINGE | INTRAMUSCULAR | Status: DC
  Administered 2022-07-28: 40 mg via SUBCUTANEOUS
  Filled 2022-07-28: qty 0.4

## 2022-07-28 MED ORDER — ONDANSETRON HCL 4 MG PO TABS: 4.0000 mg | ORAL_TABLET | Freq: Four times a day (QID) | ORAL | Status: DC | PRN

## 2022-07-28 MED ORDER — INSULIN ASPART 100 UNIT/ML IJ SOLN
0.0000 [IU] | Freq: Three times a day (TID) | INTRAMUSCULAR | Status: DC
  Administered 2022-07-28: 1 [IU] via SUBCUTANEOUS
  Administered 2022-07-29: 3 [IU] via SUBCUTANEOUS
  Administered 2022-07-29: 2 [IU] via SUBCUTANEOUS

## 2022-07-28 NOTE — Progress Notes (Signed)
PROGRESS NOTE    David Pruitt  GEX:[id number] DOB: September 16, 1945 DOA: 07/27/2022 PCP: Assunta Found, MD   Brief Narrative:    David Pruitt is a 77 y.o. male with medical history significant of hypertension, type 2 diabetes mellitus, GERD who presents to the emergency department accompanied by wife due to not feeling well which started in the evening yesterday, this started with headache and pain behind his eyes.  He was also noted to have weakness and a fever up to 101 Fahrenheit and was noted to have lactic acidosis as well as elevated white count in the ED for which she was started on multiple IV antibiotics and admitted for possible sepsis with unknown source.  Respiratory viral panel is pending and overall he is starting to feel better.  Assessment & Plan:   Principal Problem:   SIRS (systemic inflammatory response syndrome) (HCC) Active Problems:   GERD without esophagitis   Essential hypertension   Type 2 diabetes mellitus with hyperglycemia (HCC)   Lactic acidosis  Assessment and Plan:  SIRS with suspicion for sepsis Patient was tachypneic, tachycardic, febrile and has leukocytosis with left shift (meets SIRS criteria). No known source of infection at this time Patient was empirically treated with IV vancomycin, metronidazole and cefepime, we shall continue with IV vancomycin and cefepime with plan to de-escalate/discontinue based on blood culture MRSA PCR pending Respiratory panel will be checked and is pending Procalcitonin fairly low, if decreasing in a.m., can consider de-escalating antibiotics Continue IV hydration Continue Tylenol as needed Noted to have prior tick bites, check for tickborne illness   Lactic acidosis in the setting of above Currently resolved   T2DM with hyperglycemia-improved Continue ISS and hypoglycemia protocol Metformin and Glipizide will be held at this time   Essential hypertension BP meds will be held at this time due to soft BP    GERD Continue Protonix   DVT prophylaxis:Lovenox Code Status: Full Family Communication: Wife at bedside 7/16 Disposition Plan:  Status is: Inpatient Remains inpatient appropriate because: Need for IV medications.   Consultants:  None  Procedures:  None  Antimicrobials:  Anti-infectives (From admission, onward)    Start     Dose/Rate Route Frequency Ordered Stop   07/28/22 1000  ceFEPIme (MAXIPIME) 2 g in sodium chloride 0.9 % 100 mL IVPB        2 g 200 mL/hr over 30 Minutes Intravenous Every 12 hours 07/28/22 0253     07/28/22 1000  vancomycin (VANCOREADY) IVPB 750 mg/150 mL        750 mg 150 mL/hr over 60 Minutes Intravenous Every 12 hours 07/28/22 0253     07/27/22 2200  ceFEPIme (MAXIPIME) 2 g in sodium chloride 0.9 % 100 mL IVPB        2 g 200 mL/hr over 30 Minutes Intravenous  Once 07/27/22 2148 07/28/22 0001   07/27/22 2200  metroNIDAZOLE (FLAGYL) IVPB 500 mg        500 mg 100 mL/hr over 60 Minutes Intravenous  Once 07/27/22 2148 07/28/22 0001   07/27/22 2200  vancomycin (VANCOCIN) IVPB 1000 mg/200 mL premix  Status:  Discontinued        1,000 mg 200 mL/hr over 60 Minutes Intravenous  Once 07/27/22 2148 07/27/22 2152   07/27/22 2200  vancomycin (VANCOREADY) IVPB 1500 mg/300 mL        1,500 mg 150 mL/hr over 120 Minutes Intravenous  Once 07/27/22 2153 07/28/22 0206      Subjective: Patient seen and evaluated  today with no new acute complaints or concerns. No acute concerns or events noted overnight.  Noted to have fever up to 101.1 Fahrenheit overnight no other acute events or concerns noted.  He is overall feeling much better today.  Objective: Vitals:   07/28/22 0247 07/28/22 0252 07/28/22 0423 07/28/22 0624  BP:  111/67  99/77  Pulse:  95  93  Resp:    16  Temp:  (!) 101.1 F (38.4 C) 100.1 F (37.8 C) 98.6 F (37 C)  TempSrc:  Oral  Oral  SpO2: 100% 100%  96%  Weight:      Height:        Intake/Output Summary (Last 24 hours) at 07/28/2022  0727 Last data filed at 07/28/2022 0001 Gross per 24 hour  Intake 2550 ml  Output --  Net 2550 ml   Filed Weights   07/27/22 2146  Weight: 77 kg    Examination:  General exam: Appears calm and comfortable  Respiratory system: Clear to auscultation. Respiratory effort normal. Cardiovascular system: S1 & S2 heard, RRR.  Gastrointestinal system: Abdomen is soft Central nervous system: Alert and awake Extremities: No edema Skin: No significant lesions noted Psychiatry: Flat affect.    Data Reviewed: I have personally reviewed following labs and imaging studies  CBC: Recent Labs  Lab 07/27/22 2110 07/28/22 0434  WBC 20.8* 15.5*  NEUTROABS 18.9*  --   HGB 14.8 13.0  HCT 44.0 38.6*  MCV 92.2 93.2  PLT 166 147*   Basic Metabolic Panel: Recent Labs  Lab 07/27/22 2110 07/28/22 0434  NA 132* 138  K 4.1 3.6  CL 100 107  CO2 20* 23  GLUCOSE 184* 77  BUN 26* 22  CREATININE 1.11 0.97  CALCIUM 8.9 8.8*  MG  --  1.7  PHOS  --  2.9   GFR: Estimated Creatinine Clearance: 67.9 mL/min (by C-G formula based on SCr of 0.97 mg/dL). Liver Function Tests: Recent Labs  Lab 07/27/22 2110 07/28/22 0434  AST 25 26  ALT 31 28  ALKPHOS 65 54  BILITOT 1.0 1.0  PROT 7.0 6.0*  ALBUMIN 3.9 3.3*   No results for input(s): "LIPASE", "AMYLASE" in the last 168 hours. No results for input(s): "AMMONIA" in the last 168 hours. Coagulation Profile: Recent Labs  Lab 07/27/22 2110  INR 1.2   Cardiac Enzymes: No results for input(s): "CKTOTAL", "CKMB", "CKMBINDEX", "TROPONINI" in the last 168 hours. BNP (last 3 results) No results for input(s): "PROBNP" in the last 8760 hours. HbA1C: No results for input(s): "HGBA1C" in the last 72 hours. CBG: No results for input(s): "GLUCAP" in the last 168 hours. Lipid Profile: No results for input(s): "CHOL", "HDL", "LDLCALC", "TRIG", "CHOLHDL", "LDLDIRECT" in the last 72 hours. Thyroid Function Tests: No results for input(s): "TSH",  "T4TOTAL", "FREET4", "T3FREE", "THYROIDAB" in the last 72 hours. Anemia Panel: No results for input(s): "VITAMINB12", "FOLATE", "FERRITIN", "TIBC", "IRON", "RETICCTPCT" in the last 72 hours. Sepsis Labs: Recent Labs  Lab 07/27/22 2110 07/27/22 2324 07/28/22 0232 07/28/22 0434  PROCALCITON  --   --  0.25  --   LATICACIDVEN 2.5* 2.9* 2.8* 1.6    Recent Results (from the past 240 hour(s))  Resp panel by RT-PCR (RSV, Flu A&B, Covid) Anterior Nasal Swab     Status: None   Collection Time: 07/27/22  8:00 PM   Specimen: Anterior Nasal Swab  Result Value Ref Range Status   SARS Coronavirus 2 by RT PCR NEGATIVE NEGATIVE Final    Comment: (NOTE)  SARS-CoV-2 target nucleic acids are NOT DETECTED.  The SARS-CoV-2 RNA is generally detectable in upper respiratory specimens during the acute phase of infection. The lowest concentration of SARS-CoV-2 viral copies this assay can detect is 138 copies/mL. A negative result does not preclude SARS-Cov-2 infection and should not be used as the sole basis for treatment or other patient management decisions. A negative result may occur with  improper specimen collection/handling, submission of specimen other than nasopharyngeal swab, presence of viral mutation(s) within the areas targeted by this assay, and inadequate number of viral copies(<138 copies/mL). A negative result must be combined with clinical observations, patient history, and epidemiological information. The expected result is Negative.  Fact Sheet for Patients:  BloggerCourse.com  Fact Sheet for Healthcare Providers:  SeriousBroker.it  This test is no t yet approved or cleared by the [place] FDA and  has been authorized for detection and/or diagnosis of SARS-CoV-2 by FDA under an Emergency Use Authorization (EUA). This EUA will remain  in effect (meaning this test can be used) for the duration of the COVID-19 declaration under  Section 564(b)(1) of the Act, 21 U.S.C.section 360bbb-3(b)(1), unless the authorization is terminated  or revoked sooner.       Influenza A by PCR NEGATIVE NEGATIVE Final   Influenza B by PCR NEGATIVE NEGATIVE Final    Comment: (NOTE) The Xpert Xpress SARS-CoV-2/FLU/RSV plus assay is intended as an aid in the diagnosis of influenza from Nasopharyngeal swab specimens and should not be used as a sole basis for treatment. Nasal washings and aspirates are unacceptable for Xpert Xpress SARS-CoV-2/FLU/RSV testing.  Fact Sheet for Patients: BloggerCourse.com  Fact Sheet for Healthcare Providers: SeriousBroker.it  This test is not yet approved or cleared by the [place] FDA and has been authorized for detection and/or diagnosis of SARS-CoV-2 by FDA under an Emergency Use Authorization (EUA). This EUA will remain in effect (meaning this test can be used) for the duration of the COVID-19 declaration under Section 564(b)(1) of the Act, 21 U.S.C. section 360bbb-3(b)(1), unless the authorization is terminated or revoked.     Resp Syncytial Virus by PCR NEGATIVE NEGATIVE Final    Comment: (NOTE) Fact Sheet for Patients: BloggerCourse.com  Fact Sheet for Healthcare Providers: SeriousBroker.it  This test is not yet approved or cleared by the [place] FDA and has been authorized for detection and/or diagnosis of SARS-CoV-2 by FDA under an Emergency Use Authorization (EUA). This EUA will remain in effect (meaning this test can be used) for the duration of the COVID-19 declaration under Section 564(b)(1) of the Act, 21 U.S.C. section 360bbb-3(b)(1), unless the authorization is terminated or revoked.  Performed at [facility], [address], [place], [place] [address]   Blood Culture (routine x 2)     Status: None (Preliminary result)   Collection Time: 07/27/22  9:11 PM    Specimen: BLOOD RIGHT FOREARM  Result Value Ref Range Status   Specimen Description BLOOD RIGHT FOREARM  Final   Special Requests   Final    BOTTLES DRAWN AEROBIC AND ANAEROBIC Blood Culture adequate volume Performed at [facility], [address] [place], [place] [address]    Culture PENDING  Incomplete   Report Status PENDING  Incomplete  Blood Culture (routine x 2)     Status: None (Preliminary result)   Collection Time: 07/27/22  9:12 PM   Specimen: BLOOD LEFT FOREARM  Result Value Ref Range Status   Specimen Description BLOOD LEFT FOREARM  Final   Special Requests  Final    BOTTLES DRAWN AEROBIC AND ANAEROBIC Blood Culture adequate volume Performed at [facility], [address] [place], [place] [address]    Culture PENDING  Incomplete   Report Status PENDING  Incomplete         Radiology Studies: DG Chest Port 1 View  Result Date: 07/27/2022 CLINICAL DATA:  Question of sepsis.  Fever, weakness, and headache. EXAM: PORTABLE CHEST 1 VIEW COMPARISON:  02/20/2022 FINDINGS: Heart size and pulmonary vascularity are normal. Scattered interstitial changes in the lungs, likely fibrosis. No airspace disease or consolidation. No pleural effusions. No pneumothorax. Mediastinal contours appear intact. Calcification of the aorta. IMPRESSION: No evidence of active pulmonary disease. Slight interstitial pattern to the lungs, likely chronic interstitial lung disease. Electronically Signed   By: Burman Nieves M.D.   On: 07/27/2022 21:34        Scheduled Meds:  enoxaparin (LOVENOX) injection  40 mg Subcutaneous Q24H   insulin aspart  0-9 Units Subcutaneous TID WC   pantoprazole  40 mg Oral Daily   Continuous Infusions:  sodium chloride 100 mL/hr at 07/28/22 0029   ceFEPime (MAXIPIME) IV     vancomycin       LOS: 1 day    Time spent: 35 minutes    Angad Nabers Hoover Brunette, DO [facility]  If 7PM-7AM, please contact night-coverage [email or web address] 07/28/2022, 7:27 AM

## 2022-07-28 NOTE — Plan of Care (Signed)
  Problem: Education: Goal: Knowledge of General Education information will improve Description Including pain rating scale, medication(s)/side effects and non-pharmacologic comfort measures Outcome: Progressing   

## 2022-07-28 NOTE — Progress Notes (Signed)
Pharmacy Antibiotic Note  David Pruitt is a 77 y.o. male admitted on 07/27/2022 with sepsis.  Pharmacy has been consulted for Vancomycin/Cefepime dosing. WBC is elevated. Renal function ok.   Plan: Vancomycin 750 mg IV q12h >>>Estimated AUC: 504 Cefepime 2g IV q12h Trend WBC, temp, renal function  F/U infectious work-up Drug levels as indicated   Height: 5\' 10"  (177.8 cm) Weight: 77 kg (169 lb 12.1 oz) IBW/kg (Calculated) : 73  Temp (24hrs), Avg:99.5 F (37.5 C), Min:99 F (37.2 C), Max:100.1 F (37.8 C)  Recent Labs  Lab 07/27/22 2110 07/27/22 2324  WBC 20.8*  --   CREATININE 1.11  --   LATICACIDVEN 2.5* 2.9*    Estimated Creatinine Clearance: 59.4 mL/min (by C-G formula based on SCr of 1.11 mg/dL).    Allergies  Allergen Reactions   Dapagliflozin     Other reaction(s): Yeast infection   Statins     Other reaction(s): Unknown    Abran Duke, PharmD, BCPS Clinical Pharmacist Phone: [phone]

## 2022-07-28 NOTE — Progress Notes (Signed)
   07/28/22 1324  TOC Brief Assessment  Insurance and Status Reviewed  Patient has primary care physician Yes  Home environment has been reviewed Home  Prior level of function: WIth Spouse  Prior/Current Home Services No current home services  Social Determinants of Health Reivew SDOH reviewed no interventions necessary  Readmission risk has been reviewed Yes  Transition of care needs no transition of care needs at this time    Transition of Care Department [facility]) has reviewed patient and no TOC needs have been identified at this time. We will continue to monitor patient advancement through interdisciplinary progression rounds. If new patient transition needs arise, please place a TOC consult.

## 2022-07-28 NOTE — ED Notes (Signed)
 Report called  

## 2022-07-29 DIAGNOSIS — R6510 Systemic inflammatory response syndrome (SIRS) of non-infectious origin without acute organ dysfunction: Secondary | ICD-10-CM | POA: Diagnosis not present

## 2022-07-29 LAB — CBC
HCT: 39.4 % (ref 39.0–52.0)
Hemoglobin: 13.2 g/dL (ref 13.0–17.0)
MCH: 31.4 pg (ref 26.0–34.0)
MCHC: 33.5 g/dL (ref 30.0–36.0)
MCV: 93.6 fL (ref 80.0–100.0)
Platelets: 140 10*3/uL — ABNORMAL LOW (ref 150–400)
RBC: 4.21 MIL/uL — ABNORMAL LOW (ref 4.22–5.81)
RDW: 15.3 % (ref 11.5–15.5)
WBC: 14.1 10*3/uL — ABNORMAL HIGH (ref 4.0–10.5)
nRBC: 0 % (ref 0.0–0.2)

## 2022-07-29 LAB — GLUCOSE, CAPILLARY
Glucose-Capillary: 190 mg/dL — ABNORMAL HIGH (ref 70–99)
Glucose-Capillary: 210 mg/dL — ABNORMAL HIGH (ref 70–99)

## 2022-07-29 LAB — BASIC METABOLIC PANEL
Anion gap: 7 (ref 5–15)
BUN: 22 mg/dL (ref 8–23)
CO2: 24 mmol/L (ref 22–32)
Calcium: 8.6 mg/dL — ABNORMAL LOW (ref 8.9–10.3)
Chloride: 106 mmol/L (ref 98–111)
Creatinine, Ser: 1.32 mg/dL — ABNORMAL HIGH (ref 0.61–1.24)
GFR, Estimated: 56 mL/min — ABNORMAL LOW (ref >60)
Glucose, Bld: 143 mg/dL — ABNORMAL HIGH (ref 70–99)
Potassium: 4 mmol/L (ref 3.5–5.1)
Sodium: 137 mmol/L (ref 135–145)

## 2022-07-29 LAB — MAGNESIUM: Magnesium: 2 mg/dL (ref 1.7–2.4)

## 2022-07-29 LAB — PROCALCITONIN: Procalcitonin: 0.17 ng/mL

## 2022-07-29 LAB — LYME DISEASE SEROLOGY W/REFLEX: Lyme Total Antibody EIA: NEGATIVE

## 2022-07-29 MED ORDER — PROBIOTIC (LACTOBACILLUS) PO CAPS: 1.0000 | ORAL_CAPSULE | Freq: Three times a day (TID) | ORAL | 0 refills | Status: AC

## 2022-07-29 MED ORDER — SODIUM CHLORIDE 0.9 % IV SOLN
2.0000 g | Freq: Two times a day (BID) | INTRAVENOUS | Status: DC
  Administered 2022-07-29: 2 g via INTRAVENOUS
  Filled 2022-07-29: qty 12.5

## 2022-07-29 MED ORDER — CIPROFLOXACIN HCL 500 MG PO TABS: 500.0000 mg | ORAL_TABLET | Freq: Two times a day (BID) | ORAL | 0 refills | Status: AC

## 2022-07-29 NOTE — Progress Notes (Signed)
Patient discharged home with instructions given on medications and follow up visits,verbalized understanding .Prescriptions sent to Pharmacy of choice documented on AVS.IV discontinued,catheter intact. Accompanied by staff to an awaiting vehicle.

## 2022-07-29 NOTE — Care Management Important Message (Signed)
Important Message  Patient Details  Name: EMMAUS BRANDI MRN: [id number] Date of Birth: September 20, 1945   Medicare Important Message Given:  N/A - LOS <3 / Initial given by admissions     Corey Harold 07/29/2022, 11:35 AM

## 2022-07-29 NOTE — Discharge Summary (Signed)
Physician Discharge Summary   Patient: David Pruitt MRN: [id number] DOB: March 05, 1945  Admit date:     07/27/2022  Discharge date: 07/29/22  Discharge Physician: Kendell Bane   PCP: Assunta Found, MD   Recommendations at discharge:    Follow-up with the PCP in 2-4 weeks Follow-up with urologist within 1 week  Discharge Diagnoses: Principal Problem:   SIRS (systemic inflammatory response syndrome) (HCC) Active Problems:   GERD without esophagitis   Essential hypertension   Type 2 diabetes mellitus with hyperglycemia (HCC)   Lactic acidosis   David Pruitt is a 77 y.o. male with medical history significant of hypertension, type 2 diabetes mellitus, GERD who presents to the emergency department accompanied by wife due to not feeling well which started in the evening yesterday, this started with headache and pain behind his eyes.  He was also noted to have weakness and a fever up to 101 Fahrenheit and was noted to have lactic acidosis as well as elevated white count in the ED for which she was started on multiple IV antibiotics and admitted for possible sepsis with unknown source.  Respiratory viral panel is pending and overall he is starting to feel better.      SIRS with suspicion for sepsis Patient was tachypneic, tachycardic, febrile and has leukocytosis with left shift (meets SIRS criteria). No known source of infection at this time -possible cellulitis versus epididymitis, with mild erythema edema of scrotal and penile  Patient was empirically treated with IV vancomycin, metronidazole and cefepime,  de-escalate/discontinue based on blood culture negative, switch to p.o. ciprofloxacin MRSA PCR negative-IV vancomycin discontinued Respiratory panel were negative Procalcitonin fairly low, if decreasing in a.m., can consider de-escalating antibiotics Status post IV hydration Continue Tylenol as needed Noted to have prior tick bites, check for tickborne  illness  Continue with oral antibiotics and follow-up with urologist for further evaluation   Lactic acidosis - Currently resolved   T2DM with hyperglycemia-improved, resume Metformin and Glipizide    HTN, stable no need for med occasion at this point    GERD Continue Protonix   Disposition: Home Diet recommendation:  Discharge Diet Orders (From admission, onward)     Start     Ordered   07/29/22 0000  Diet - low sodium heart healthy        07/29/22 1044           Cardiac diet DISCHARGE MEDICATION: Allergies as of 07/29/2022       Reactions   Dapagliflozin    Other reaction(s): Yeast infection   Statins    Other reaction(s): Unknown        Medication List     STOP taking these medications    Apple Cider Vinegar 500 MG Tabs   ascorbic acid 1000 MG tablet Commonly known as: VITAMIN C   dextromethorphan 30 MG/5ML liquid Commonly known as: DELSYM   lisinopril 10 MG tablet Commonly known as: ZESTRIL   loperamide 2 MG capsule Commonly known as: IMODIUM   loratadine 10 MG tablet Commonly known as: CLARITIN   Magnesium 250 MG Tabs       TAKE these medications    ciprofloxacin 500 MG tablet Commonly known as: Cipro Take 1 tablet (500 mg total) by mouth 2 (two) times daily for 10 days.   clopidogrel 75 MG tablet Commonly known as: PLAVIX Take 1 tablet (75 mg total) by mouth daily.   CO-Q 10 Omega-3 Fish Oil Caps Take 1 capsule by mouth daily.  cyanocobalamin 1000 MCG tablet Commonly known as: VITAMIN B12 Take 1,000 mcg daily by mouth.   gabapentin 100 MG capsule Commonly known as: NEURONTIN Take 100 mg by mouth 3 (three) times daily.   glipiZIDE 5 MG 24 hr tablet Commonly known as: GLUCOTROL XL Take 5 mg by mouth daily.   glucosamine-chondroitin 500-400 MG tablet Take 1 tablet by mouth daily.   metFORMIN 500 MG 24 hr tablet Commonly known as: GLUCOPHAGE-XR Take 500 mg by mouth daily.   metoprolol succinate 50 MG 24 hr  tablet Commonly known as: TOPROL-XL Take 50 mg by mouth daily.   multivitamin with minerals tablet Take 1 tablet by mouth daily.   Ozempic (2 MG/DOSE) 8 MG/3ML Sopn Generic drug: Semaglutide (2 MG/DOSE) Inject 2 mg into the skin once a week.   pantoprazole 40 MG tablet Commonly known as: PROTONIX Take 1 tablet (40 mg total) by mouth daily. What changed:  when to take this reasons to take this   Probiotic (Lactobacillus) Caps Take 1 capsule by mouth every 8 (eight) hours for 10 days. What changed: when to take this   Turmeric Curcumin 500 MG Caps Take 1 capsule by mouth daily.        Discharge Exam: Filed Weights   07/27/22 2146  Weight: 77 kg        General:  AAO x 3,  cooperative, no distress;   HEENT:  Normocephalic, PERRL, otherwise with in Normal limits   Neuro:  CNII-XII intact. , normal motor and sensation, reflexes intact   Lungs:   Clear to auscultation BL, Respirations unlabored,  No wheezes / crackles  Cardio:    S1/S2, RRR, No murmure, No Rubs or Gallops   Abdomen:  Soft, non-tender, bowel sounds active all four quadrants, no guarding or peritoneal signs. Pelvic, scrotal penile edema erythema  Muscular  skeletal:  Limited exam -global generalized weaknesses - in bed, able to move all 4 extremities,   2+ pulses,  symmetric, No pitting edema  Skin:  Dry, warm to touch,  Scrotal/penile edema erythema negative for any open wounds  Wounds: Please see nursing documentation        Condition at discharge: good  The results of significant diagnostics from this hospitalization (including imaging, microbiology, ancillary and laboratory) are listed below for reference.   Imaging Studies: DG Chest Port 1 View  Result Date: 07/27/2022 CLINICAL DATA:  Question of sepsis.  Fever, weakness, and headache. EXAM: PORTABLE CHEST 1 VIEW COMPARISON:  02/20/2022 FINDINGS: Heart size and pulmonary vascularity are normal. Scattered interstitial changes in the lungs,  likely fibrosis. No airspace disease or consolidation. No pleural effusions. No pneumothorax. Mediastinal contours appear intact. Calcification of the aorta. IMPRESSION: No evidence of active pulmonary disease. Slight interstitial pattern to the lungs, likely chronic interstitial lung disease. Electronically Signed   By: Burman Nieves M.D.   On: 07/27/2022 21:34   CT CHEST WO CONTRAST  Result Date: 07/13/2022 CLINICAL DATA:  Follow-up of right lower lobe consolidation. EXAM: CT CHEST WITHOUT CONTRAST TECHNIQUE: Multidetector CT imaging of the chest was performed following the standard protocol without IV contrast. RADIATION DOSE REDUCTION: This exam was performed according to the departmental dose-optimization program which includes automated exposure control, adjustment of the mA and/or kV according to patient size and/or use of iterative reconstruction technique. COMPARISON:  CT scan chest from 01/12/2022. FINDINGS: Cardiovascular: Normal cardiac size. No pericardial effusion. No aortic aneurysm. There are coronary artery calcifications, in keeping with coronary artery disease. There also mild-to-moderate peripheral  atherosclerotic vascular calcifications of the thoracic aorta and its major branches. Mediastinum/Nodes: Visualized thyroid gland appears grossly unremarkable. No solid / cystic mediastinal masses. The esophagus is nondistended precluding optimal assessment. There is a small sliding hiatal hernia. No axillary or mediastinal lymphadenopathy by size criteria. Evaluation of bilateral hila is limited due to lack on intravenous contrast: however, no large hilar lymphadenopathy identified. Lungs/Pleura: The central tracheo-bronchial tree is patent. Redemonstration of upper lobe predominant centrilobular and paraseptal emphysematous changes. There also patchy areas of subpleural reticulations. No bronchiectasis or lower lobe predominance. There is stable calcified pleural plaque along the left posterior  inferior hemithorax, nonspecific but most commonly seen as a sequela of asbestos related infection. There is complete clearing of previously noted bilateral lower lobe pneumonia. No new mass or consolidation. No pleural effusion or pneumothorax. There are multiple, scattered, sub 4 mm, calcified and noncalcified nodules throughout bilateral lungs. No suspicious lung nodule. Upper Abdomen: Visualized upper abdominal viscera within normal limits. Musculoskeletal: The visualized soft tissues of the chest wall are grossly unremarkable. No suspicious osseous lesions. There are mild multilevel degenerative changes in the visualized spine. IMPRESSION: 1. Complete clearing of previously noted bilateral lower lobe pneumonia. No new mass or consolidation. 2. Stable calcified pleural plaque along the left posterior inferior hemithorax, nonspecific but most commonly seen as a sequela of asbestos related infection. 3. Multiple other nonacute observations, as described above. Emphysema (ICD10-J43.9). Electronically Signed   By: Jules Schick M.D.   On: 07/13/2022 11:44   MR ABDOMEN WWO CONTRAST  Result Date: 07/05/2022 CLINICAL DATA:  Characterize indeterminate left renal lesion incidentally identified by prior CT EXAM: MRI ABDOMEN WITHOUT AND WITH CONTRAST TECHNIQUE: Multiplanar multisequence MR imaging of the abdomen was performed both before and after the administration of intravenous contrast. CONTRAST:  7.17mL GADAVIST GADOBUTROL 1 MMOL/ML IV SOLN COMPARISON:  CT chest, 01/12/2022, renal ultrasound, 01/14/2022, CT abdomen pelvis, 12/02/2018 FINDINGS: Lower chest: No acute abnormality.  Small hiatal hernia Hepatobiliary: No solid liver abnormality is seen. No gallstones, gallbladder wall thickening, or biliary dilatation. Pancreas: Multiple fluid signal cystic lesions scattered throughout the pancreas, largest in the ventral pancreatic neck measuring 2.0 x 1.4 cm (series 6, image 15). In retrospective review, these are  not appreciably changed in comparison to prior examination dated 12/02/2018. No solid component or suspicious contrast enhancement. No pancreatic ductal dilatation or surrounding inflammatory changes. Spleen: Normal in size without significant abnormality. Adrenals/Urinary Tract: Adrenal glands are unremarkable. Multiple simple, benign fluid signal renal cortical cysts, including of the peripheral superior pole of the left kidney, corresponding to finding of prior CT (series 6, image 14). No solid component or suspicious contrast enhancement. Cortical scarring of both the peripheral superior pole of the left kidney and inferior pole of the right kidney, in keeping with prior infectious, obstructive, or ischemic insult. Kidneys are otherwise normal, without obvious renal calculi, solid lesion, or hydronephrosis. Stomach/Bowel: Stomach is within normal limits. No evidence of bowel wall thickening, distention, or inflammatory changes. Vascular/Lymphatic: Aortic atherosclerosis. No enlarged abdominal lymph nodes. Other: No abdominal wall hernia or abnormality. No ascites. Musculoskeletal: No acute or significant osseous findings. IMPRESSION: 1. Multiple simple, benign renal cortical cysts, including of the peripheral superior pole of the left kidney, corresponding to finding of prior CT. These are furthermore unchanged in comparison to more remote examination dated 12/02/2018. No further follow-up or characterization is required for these benign cysts (Bosniak category I). 2. Multiple fluid signal cystic lesions scattered throughout the pancreas, largest in the ventral pancreatic  neck measuring 2.0 x 1.4 cm. In retrospective review, these are not appreciably changed in comparison to prior examination dated 12/02/2018. No solid component or suspicious contrast enhancement. 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 Given well established stability, these are benign, and no further follow-up or  characterization is required. 3. Cortical scarring of both the peripheral superior pole of the left kidney and inferior pole of the right kidney, in keeping with prior infectious, obstructive, or ischemic insult. 4. Small hiatal hernia. Aortic Atherosclerosis (ICD10-I70.0). Electronically Signed   By: Jearld Lesch M.D.   On: 07/05/2022 21:34    Microbiology: Results for orders placed or performed during the hospital encounter of 07/27/22  Resp panel by RT-PCR (RSV, Flu A&B, Covid) Anterior Nasal Swab     Status: None   Collection Time: 07/27/22  8:00 PM   Specimen: Anterior Nasal Swab  Result Value Ref Range Status   SARS Coronavirus 2 by RT PCR NEGATIVE NEGATIVE Final    Comment: (NOTE) SARS-CoV-2 target nucleic acids are NOT DETECTED.  The SARS-CoV-2 RNA is generally detectable in upper respiratory specimens during the acute phase of infection. The lowest concentration of SARS-CoV-2 viral copies this assay can detect is 138 copies/mL. A negative result does not preclude SARS-Cov-2 infection and should not be used as the sole basis for treatment or other patient management decisions. A negative result may occur with  improper specimen collection/handling, submission of specimen other than nasopharyngeal swab, presence of viral mutation(s) within the areas targeted by this assay, and inadequate number of viral copies(<138 copies/mL). A negative result must be combined with clinical observations, patient history, and epidemiological information. The expected result is Negative.  Fact Sheet for Patients:  BloggerCourse.com  Fact Sheet for Healthcare Providers:  SeriousBroker.it  This test is no t yet approved or cleared by the [place] FDA and  has been authorized for detection and/or diagnosis of SARS-CoV-2 by FDA under an Emergency Use Authorization (EUA). This EUA will remain  in effect (meaning this test can be used) for the  duration of the COVID-19 declaration under Section 564(b)(1) of the Act, 21 U.S.C.section 360bbb-3(b)(1), unless the authorization is terminated  or revoked sooner.       Influenza A by PCR NEGATIVE NEGATIVE Final   Influenza B by PCR NEGATIVE NEGATIVE Final    Comment: (NOTE) The Xpert Xpress SARS-CoV-2/FLU/RSV plus assay is intended as an aid in the diagnosis of influenza from Nasopharyngeal swab specimens and should not be used as a sole basis for treatment. Nasal washings and aspirates are unacceptable for Xpert Xpress SARS-CoV-2/FLU/RSV testing.  Fact Sheet for Patients: BloggerCourse.com  Fact Sheet for Healthcare Providers: SeriousBroker.it  This test is not yet approved or cleared by the [place] FDA and has been authorized for detection and/or diagnosis of SARS-CoV-2 by FDA under an Emergency Use Authorization (EUA). This EUA will remain in effect (meaning this test can be used) for the duration of the COVID-19 declaration under Section 564(b)(1) of the Act, 21 U.S.C. section 360bbb-3(b)(1), unless the authorization is terminated or revoked.     Resp Syncytial Virus by PCR NEGATIVE NEGATIVE Final    Comment: (NOTE) Fact Sheet for Patients: BloggerCourse.com  Fact Sheet for Healthcare Providers: SeriousBroker.it  This test is not yet approved or cleared by the [place] FDA and has been authorized for detection and/or diagnosis of SARS-CoV-2 by FDA under an Emergency Use Authorization (EUA). This EUA will remain in effect (meaning this test can be  used) for the duration of the COVID-19 declaration under Section 564(b)(1) of the Act, 21 U.S.C. section 360bbb-3(b)(1), unless the authorization is terminated or revoked.  Performed at [facility], [address], [place], [place] [address]   Blood Culture (routine x 2)     Status: None (Preliminary result)    Collection Time: 07/27/22  9:11 PM   Specimen: BLOOD RIGHT FOREARM  Result Value Ref Range Status   Specimen Description BLOOD RIGHT FOREARM  Final   Special Requests   Final    BOTTLES DRAWN AEROBIC AND ANAEROBIC Blood Culture adequate volume   Culture   Final    NO GROWTH 2 DAYS Performed at [facility] [place], [address], [place], [place] [address]    Report Status PENDING  Incomplete  Blood Culture (routine x 2)     Status: None (Preliminary result)   Collection Time: 07/27/22  9:12 PM   Specimen: BLOOD LEFT FOREARM  Result Value Ref Range Status   Specimen Description BLOOD LEFT FOREARM  Final   Special Requests   Final    BOTTLES DRAWN AEROBIC AND ANAEROBIC Blood Culture adequate volume   Culture   Final    NO GROWTH 2 DAYS Performed at [facility], [address] [place], [place] [address]    Report Status PENDING  Incomplete  MRSA Next Gen by PCR, Nasal     Status: None   Collection Time: 07/28/22 11:59 AM   Specimen: Nasal Mucosa; Nasal Swab  Result Value Ref Range Status   MRSA by PCR Next Gen NOT DETECTED NOT DETECTED Final    Comment: (NOTE) The GeneXpert MRSA Assay (FDA approved for NASAL specimens only), is one component of a comprehensive MRSA colonization surveillance program. It is not intended to diagnose MRSA infection nor to guide or monitor treatment for MRSA infections. Test performance is not FDA approved in patients less than 21 years old. Performed at [facility], [address], [place], [place] [address]     Labs: CBC: Recent Labs  Lab 07/27/22 2110 07/28/22 0434 07/29/22 0432  WBC 20.8* 15.5* 14.1*  NEUTROABS 18.9*  --   --   HGB 14.8 13.0 13.2  HCT 44.0 38.6* 39.4  MCV 92.2 93.2 93.6  PLT 166 147* 140*   Basic Metabolic Panel: Recent Labs  Lab 07/27/22 2110 07/28/22 0434 07/29/22 0432  NA 132* 138 137  K 4.1 3.6 4.0  CL 100 107 106  CO2 20* 23 24  GLUCOSE 184* 77 143*  BUN 26* 22 22  CREATININE 1.11 0.97 1.32*  CALCIUM 8.9  8.8* 8.6*  MG  --  1.7 2.0  PHOS  --  2.9  --    Liver Function Tests: Recent Labs  Lab 07/27/22 2110 07/28/22 0434  AST 25 26  ALT 31 28  ALKPHOS 65 54  BILITOT 1.0 1.0  PROT 7.0 6.0*  ALBUMIN 3.9 3.3*   CBG: Recent Labs  Lab 07/28/22 0733 07/28/22 1124 07/28/22 1543 07/28/22 2102 07/29/22 0903  GLUCAP 74 145* 81 213* 190*    Discharge time spent: greater than 40  minutes.  Signed: Kendell Bane, MD [facility] 07/29/2022

## 2022-07-30 DIAGNOSIS — I10 Essential (primary) hypertension: Secondary | ICD-10-CM | POA: Diagnosis not present

## 2022-07-30 DIAGNOSIS — R6510 Systemic inflammatory response syndrome (SIRS) of non-infectious origin without acute organ dysfunction: Secondary | ICD-10-CM | POA: Diagnosis not present

## 2022-07-31 LAB — CULTURE, BLOOD (ROUTINE X 2): Special Requests: ADEQUATE

## 2022-08-01 LAB — CULTURE, BLOOD (ROUTINE X 2)
Culture: NO GROWTH
Culture: NO GROWTH
Special Requests: ADEQUATE

## 2022-08-05 DIAGNOSIS — R6510 Systemic inflammatory response syndrome (SIRS) of non-infectious origin without acute organ dysfunction: Secondary | ICD-10-CM | POA: Diagnosis not present

## 2022-08-17 DIAGNOSIS — J9601 Acute respiratory failure with hypoxia: Secondary | ICD-10-CM | POA: Diagnosis not present

## 2022-08-27 ENCOUNTER — Ambulatory Visit (INDEPENDENT_AMBULATORY_CARE_PROVIDER_SITE_OTHER): Payer: Medicare Other

## 2022-08-27 VITALS — BP 159/77 | HR 83 | Temp 97.3°F | Resp 16 | Ht 70.0 in | Wt 180.8 lb

## 2022-08-27 DIAGNOSIS — E7800 Pure hypercholesterolemia, unspecified: Secondary | ICD-10-CM

## 2022-08-27 MED ORDER — INCLISIRAN SODIUM 284 MG/1.5ML ~~LOC~~ SOSY
284.0000 mg | PREFILLED_SYRINGE | Freq: Once | SUBCUTANEOUS | Status: AC
  Administered 2022-08-27: 284 mg via SUBCUTANEOUS
  Filled 2022-08-27: qty 1.5

## 2022-08-27 NOTE — Progress Notes (Signed)
 Diagnosis: Hyperlipidemia  Provider:  Chilton Greathouse MD  Procedure: Injection  Leqvio (inclisiran), Dose: 284 mg, Site: subcutaneous, Number of injections: 1  Post Care: Patient declined observation Left arm injection  Discharge: Condition: Good, Destination: Home . AVS Declined  Performed by:  Rico Ala, LPN

## 2022-08-28 ENCOUNTER — Ambulatory Visit: Payer: Medicare Other | Admitting: Urology

## 2022-08-28 DIAGNOSIS — Z8673 Personal history of transient ischemic attack (TIA), and cerebral infarction without residual deficits: Secondary | ICD-10-CM | POA: Insufficient documentation

## 2022-08-28 DIAGNOSIS — Z87442 Personal history of urinary calculi: Secondary | ICD-10-CM | POA: Insufficient documentation

## 2022-08-28 NOTE — Progress Notes (Deleted)
Name: David Pruitt DOB: 05-Dec-1945 MRN: [id number]  History of Present Illness: Mr. Meinecke is a 77 y.o. male who presents today for follow up visit at [facility]. - GU history: 1. Buried penis. Underwent complex repair by Dr. Gillian Shields on 05/22/2019 including anterior urethroplasty, buried penis repair, mons plasty, and split thickness skin graft to penis. 2. Penile lichen sclerosus. 3. Kidney stones.  - No stones or hydronephrosis seen on abdominal MRI on 07/01/2022. 4. Renal cysts. - Abdominal MRI on 07/01/2022 showed "Multiple simple, benign fluid signal renal cortical cysts.Marland KitchenMarland KitchenNo solid component or suspicious contrast enhancement." 5. Left hydrocele.  6. Prior episode of epididymoorchitis. In September 2022. 7. Family history of prostate cancer. - Patient's PSA has been normal: 0.50 on 01/08/2017; 0.68 on 03/09/2019.  At last visit with Dr. Pete Glatter on 10/09/2020: - Doing well; asymptomatic. PVR = 72 ml. - The plan was follow-up as needed. Discussed prostate cancer screening with a PSA and DRE in several months.  Since last visit: Admitted 07/27/2022 - 07/29/2022 for SIRS with suspicion for sepsis. Per discharge summary: "Patient was tachypneic, tachycardic, febrile and has leukocytosis with left shift (meets SIRS criteria). No known source of infection at this time -possible cellulitis versus epididymitis, with mild erythema edema of scrotal and penile". Treated empirically with IV vancomycin, metronidazole and cefepime,  de-escalate/discontinue based on blood culture negative, switch to p.o. ciprofloxacin. MRSA PCR negative-IV vancomycin discontinued.  Today: He reports ***  He {Actions; denies-reports:120008} penile / scrotal redness, warmth, tenderness, swelling, or other concerns. He {Actions; denies-reports:120008} fevers. He {Actions; denies-reports:120008} urinary complaints.  He {Actions; denies-reports:120008} increased urinary urgency, frequency,  nocturia, dysuria, gross hematuria, hesitancy, straining to void, or sensations of incomplete emptying.   Fall Screening: Do you usually have a device to assist in your mobility? {yes/no:20286} ***cane / ***walker / ***wheelchair   Medications: Current Outpatient Medications  Medication Sig Dispense Refill   clopidogrel (PLAVIX) 75 MG tablet Take 1 tablet (75 mg total) by mouth daily.     Coenzyme Q10-Fish Oil-Vit E (CO-Q 10 OMEGA-3 FISH OIL) CAPS Take 1 capsule by mouth daily.     gabapentin (NEURONTIN) 100 MG capsule Take 100 mg by mouth 3 (three) times daily.     glipiZIDE (GLUCOTROL XL) 5 MG 24 hr tablet Take 5 mg by mouth daily.     glucosamine-chondroitin 500-400 MG tablet Take 1 tablet by mouth daily.     metFORMIN (GLUCOPHAGE-XR) 500 MG 24 hr tablet Take 500 mg by mouth daily.     metoprolol succinate (TOPROL-XL) 50 MG 24 hr tablet Take 50 mg by mouth daily.     Multiple Vitamins-Minerals (MULTIVITAMIN WITH MINERALS) tablet Take 1 tablet by mouth daily.     pantoprazole (PROTONIX) 40 MG tablet Take 1 tablet (40 mg total) by mouth daily. (Patient taking differently: Take 40 mg by mouth daily as needed (reflux).) 90 tablet 3   Semaglutide, 2 MG/DOSE, (OZEMPIC, 2 MG/DOSE,) 8 MG/3ML SOPN Inject 2 mg into the skin once a week. 3 mL 5   Turmeric Curcumin 500 MG CAPS Take 1 capsule by mouth daily.     vitamin B-12 (CYANOCOBALAMIN) 1000 MCG tablet Take 1,000 mcg daily by mouth.     No current facility-administered medications for this visit.    Allergies: Allergies  Allergen Reactions   Dapagliflozin     Other reaction(s): Yeast infection   Statins     Other reaction(s): Unknown    Past Medical History:  Diagnosis Date  CKD (chronic kidney disease), stage IIIa (HCC) 08/15/2020   CVA (cerebral infarction)    2006   DM (dermatomyositis)    Gastritis    GERD (gastroesophageal reflux disease)    Hemorrhoids    Hypercholesteremia    Hypertension    Lower GI bleed    SECONDARY  TO DIVERTICULA,S/P BLEED   Overweight (BMI 25.0-29.9) 08/15/2020   Shortness of breath dyspnea    Type 2 diabetes mellitus (HCC)    Past Surgical History:  Procedure Laterality Date   COLONOSCOPY  12/2009   RMR: Lower GI bleed due to a single diverticulum status post Endo clipping.  Scattered diverticulosis.   COLONOSCOPY N/A 05/24/2014   Procedure: COLONOSCOPY;  Surgeon: Malissa Hippo, MD;  [facility]: [facility];  Service: Endoscopy;  Laterality: N/A;  1030   COLONOSCOPY N/A 03/15/2019   Diverticulosis, 5 polyps removed.  Pathology revealed tubular adenomas.  Next colonoscopy in 3 years.   COLONOSCOPY WITH PROPOFOL N/A 08/16/2020   Procedure: COLONOSCOPY WITH PROPOFOL;  Surgeon: Malissa Hippo, MD;  [facility]: [facility];  Service: Endoscopy;  Laterality: N/A;   ESOPHAGOGASTRODUODENOSCOPY  12/2009   RMR: Mild esophagitis, gastritis, duodenitis.  Gastric biopsies negative for H. pylori   ESOPHAGOGASTRODUODENOSCOPY (EGD) WITH PROPOFOL N/A 08/15/2020   Procedure: ESOPHAGOGASTRODUODENOSCOPY (EGD) WITH PROPOFOL;  Surgeon: Corbin Ade, MD;  [facility]: [facility];  Service: Endoscopy;  Laterality: N/A;  covid + (asymptomatic)   GIVENS CAPSULE STUDY N/A 08/29/2020   Procedure: GIVENS CAPSULE STUDY;  Surgeon: Malissa Hippo, MD;  [facility];  Service: Endoscopy;  Laterality: N/A;   POLYPECTOMY  03/15/2019   Procedure: POLYPECTOMY;  Surgeon: Corbin Ade, MD;  [facility];  Service: Endoscopy;;   Family History  Problem Relation Age of Onset   Colon cancer Father    Stroke Father    Social History   Socioeconomic History   Marital status: Married    Spouse name: Not on file   Number of children: Not on file   Years of education: Not on file   Highest education level: Not on file  Occupational History   Not on file  Tobacco Use   Smoking status: Former    Current packs/day: 1.00    Average packs/day: 1 pack/day for 40.0 years (40.0 ttl pk-yrs)     Types: Cigarettes   Smokeless tobacco: Never  Vaping Use   Vaping status: Never Used  Substance and Sexual Activity   Alcohol use: Yes    Comment: occasionally   Drug use: No   Sexual activity: Yes    Partners: Female    Comment: spouse  Other Topics Concern   Not on file  Social History Narrative   Not on file   Social Determinants of Health   Financial Resource Strain: Not on file  Food Insecurity: No Food Insecurity (07/28/2022)   Hunger Vital Sign    Worried About Running Out of Food in the Last Year: Never true    Ran Out of Food in the Last Year: Never true  Transportation Needs: No Transportation Needs (07/28/2022)   PRAPARE - Administrator, Civil Service (Medical): No    Lack of Transportation (Non-Medical): No  Physical Activity: Not on file  Stress: Not on file  Social Connections: Not on file  Intimate Partner Violence: Not At Risk (07/28/2022)   Humiliation, Afraid, Rape, and Kick questionnaire    Fear of Current or Ex-Partner: No  Emotionally Abused: No    Physically Abused: No    Sexually Abused: No    Review of Systems Constitutional: Patient ***denies any unintentional weight loss or change in strength lntegumentary: Patient ***denies any rashes or pruritus Eyes: Patient denies ***dry eyes ENT: Patient ***denies dry mouth Cardiovascular: Patient ***denies chest pain or syncope Respiratory: Patient ***denies shortness of breath Gastrointestinal: Patient ***denies nausea, vomiting, constipation, or diarrhea Musculoskeletal: Patient ***denies muscle cramps or weakness Neurologic: Patient ***denies convulsions or seizures Psychiatric: Patient ***denies memory problems Allergic/Immunologic: Patient ***denies recent allergic reaction(s) Hematologic/Lymphatic: Patient denies bleeding tendencies Endocrine: Patient ***denies heat/cold intolerance  GU: As per HPI.  OBJECTIVE There were no vitals filed for this visit. There is no height or  weight on file to calculate BMI.  Physical Examination  Constitutional: ***No obvious distress; patient is ***non-toxic appearing  Cardiovascular: ***No visible lower extremity edema.  Respiratory: The patient does ***not have audible wheezing/stridor; respirations do ***not appear labored  Gastrointestinal: Abdomen ***non-distended Musculoskeletal: ***Normal ROM of UEs  Skin: ***No obvious rashes/open sores  Neurologic: CN 2-12 grossly ***intact Psychiatric: Answered questions ***appropriately with ***normal affect  Hematologic/Lymphatic/Immunologic: ***No obvious bruises or sites of spontaneous bleeding  UA: {Desc; negative/positive:13464} for *** WBC/hpf, *** RBC/hpf, bacteria (***) PVR: *** ml  ASSESSMENT SIRS (systemic inflammatory response syndrome) [facility])  Hospital discharge follow-up ***  Will plan for follow up in *** months / ***1 year or sooner if needed. Pt verbalized understanding and agreement. All questions were answered.  PLAN Advised the following: 1. *** 2. ***No follow-ups on file.  No orders of the defined types were placed in this encounter.   It has been explained that the patient is to follow regularly with their PCP in addition to all other providers involved in their care and to follow instructions provided by these respective offices. Patient advised to contact urology clinic if any urologic-pertaining questions, concerns, new symptoms or problems arise in the interim period.  There are no Patient Instructions on file for this visit.  Electronically signed by:  Donnita Falls, FNP   08/28/22    8:51 AM

## 2022-09-24 DIAGNOSIS — E1165 Type 2 diabetes mellitus with hyperglycemia: Secondary | ICD-10-CM | POA: Diagnosis not present

## 2022-09-24 DIAGNOSIS — E7800 Pure hypercholesterolemia, unspecified: Secondary | ICD-10-CM | POA: Diagnosis not present

## 2022-10-02 DIAGNOSIS — N1831 Chronic kidney disease, stage 3a: Secondary | ICD-10-CM | POA: Diagnosis not present

## 2022-10-02 DIAGNOSIS — E1122 Type 2 diabetes mellitus with diabetic chronic kidney disease: Secondary | ICD-10-CM | POA: Diagnosis not present

## 2022-10-02 DIAGNOSIS — J069 Acute upper respiratory infection, unspecified: Secondary | ICD-10-CM | POA: Diagnosis not present

## 2022-10-02 DIAGNOSIS — R6889 Other general symptoms and signs: Secondary | ICD-10-CM | POA: Diagnosis not present

## 2022-10-30 DIAGNOSIS — H5211 Myopia, right eye: Secondary | ICD-10-CM | POA: Diagnosis not present

## 2022-10-30 DIAGNOSIS — H43812 Vitreous degeneration, left eye: Secondary | ICD-10-CM | POA: Diagnosis not present

## 2022-11-02 DIAGNOSIS — E1122 Type 2 diabetes mellitus with diabetic chronic kidney disease: Secondary | ICD-10-CM | POA: Diagnosis not present

## 2022-11-18 DIAGNOSIS — I10 Essential (primary) hypertension: Secondary | ICD-10-CM | POA: Diagnosis not present

## 2022-11-18 DIAGNOSIS — E0840 Diabetes mellitus due to underlying condition with diabetic neuropathy, unspecified: Secondary | ICD-10-CM | POA: Diagnosis not present

## 2022-11-18 DIAGNOSIS — E1165 Type 2 diabetes mellitus with hyperglycemia: Secondary | ICD-10-CM | POA: Diagnosis not present

## 2022-11-18 DIAGNOSIS — Z23 Encounter for immunization: Secondary | ICD-10-CM | POA: Diagnosis not present

## 2022-11-18 DIAGNOSIS — I639 Cerebral infarction, unspecified: Secondary | ICD-10-CM | POA: Diagnosis not present

## 2022-11-18 DIAGNOSIS — Z789 Other specified health status: Secondary | ICD-10-CM | POA: Diagnosis not present

## 2022-11-18 DIAGNOSIS — E7800 Pure hypercholesterolemia, unspecified: Secondary | ICD-10-CM | POA: Diagnosis not present

## 2022-11-18 DIAGNOSIS — Z8673 Personal history of transient ischemic attack (TIA), and cerebral infarction without residual deficits: Secondary | ICD-10-CM | POA: Diagnosis not present

## 2022-12-03 DIAGNOSIS — D0462 Carcinoma in situ of skin of left upper limb, including shoulder: Secondary | ICD-10-CM | POA: Diagnosis not present

## 2022-12-03 DIAGNOSIS — L814 Other melanin hyperpigmentation: Secondary | ICD-10-CM | POA: Diagnosis not present

## 2022-12-03 DIAGNOSIS — D225 Melanocytic nevi of trunk: Secondary | ICD-10-CM | POA: Diagnosis not present

## 2022-12-03 DIAGNOSIS — L821 Other seborrheic keratosis: Secondary | ICD-10-CM | POA: Diagnosis not present

## 2022-12-03 DIAGNOSIS — L570 Actinic keratosis: Secondary | ICD-10-CM | POA: Diagnosis not present

## 2022-12-03 DIAGNOSIS — D485 Neoplasm of uncertain behavior of skin: Secondary | ICD-10-CM | POA: Diagnosis not present

## 2022-12-12 DIAGNOSIS — E1122 Type 2 diabetes mellitus with diabetic chronic kidney disease: Secondary | ICD-10-CM | POA: Diagnosis not present

## 2022-12-12 DIAGNOSIS — I10 Essential (primary) hypertension: Secondary | ICD-10-CM | POA: Diagnosis not present

## 2022-12-12 DIAGNOSIS — G720 Drug-induced myopathy: Secondary | ICD-10-CM | POA: Diagnosis not present

## 2022-12-12 DIAGNOSIS — E782 Mixed hyperlipidemia: Secondary | ICD-10-CM | POA: Diagnosis not present

## 2022-12-12 DIAGNOSIS — E1140 Type 2 diabetes mellitus with diabetic neuropathy, unspecified: Secondary | ICD-10-CM | POA: Diagnosis not present

## 2022-12-14 DIAGNOSIS — D0462 Carcinoma in situ of skin of left upper limb, including shoulder: Secondary | ICD-10-CM | POA: Diagnosis not present

## 2023-01-12 DIAGNOSIS — E782 Mixed hyperlipidemia: Secondary | ICD-10-CM | POA: Diagnosis not present

## 2023-01-12 DIAGNOSIS — G720 Drug-induced myopathy: Secondary | ICD-10-CM | POA: Diagnosis not present

## 2023-01-12 DIAGNOSIS — E1122 Type 2 diabetes mellitus with diabetic chronic kidney disease: Secondary | ICD-10-CM | POA: Diagnosis not present

## 2023-02-03 ENCOUNTER — Encounter: Payer: Self-pay | Admitting: Pulmonary Disease

## 2023-03-01 ENCOUNTER — Ambulatory Visit: Payer: Medicare Other

## 2023-03-01 ENCOUNTER — Telehealth: Payer: Self-pay

## 2023-03-01 VITALS — BP 134/82 | HR 78 | Temp 97.5°F | Resp 16 | Ht 70.0 in | Wt 183.4 lb

## 2023-03-01 DIAGNOSIS — E7800 Pure hypercholesterolemia, unspecified: Secondary | ICD-10-CM

## 2023-03-01 MED ORDER — INCLISIRAN SODIUM 284 MG/1.5ML ~~LOC~~ SOSY
284.0000 mg | PREFILLED_SYRINGE | Freq: Once | SUBCUTANEOUS | Status: AC
  Administered 2023-03-01: 284 mg via SUBCUTANEOUS

## 2023-03-01 NOTE — Telephone Encounter (Addendum)
 Auth Submission: APPROVED Site of care: Site of care: [facility] Payer: [employer] medicare Medication & CPT/J Code(s) submitted: Leqvio (Inclisiran) [id number] Route of submission (phone, fax, portal): portal  Phone # Fax # Auth type: Buy/Bill PB Units/visits requested: 284mg  x 2 doses Reference number: [id number] Approval from: 03/01/23 to 03/04/24

## 2023-03-01 NOTE — Progress Notes (Signed)
 Diagnosis: Hyperlipidemia  Provider:  Chilton Greathouse MD  Procedure: Injection  Leqvio (inclisiran), Dose: 284 mg, Site: subcutaneous, Number of injections: 1  Injection Site(s): Right arm  Post Care:  right arm injection  Discharge: Condition: Good, Destination: Home . AVS Declined  Performed by:  Rico Ala, LPN

## 2023-03-01 NOTE — Telephone Encounter (Signed)
Insurance is asking for updated chart notes. I called Dr. Gilman Schmidt office at [phone] and left a message for Adelina Mings to fax those chart notes to [phone].

## 2023-03-03 ENCOUNTER — Telehealth: Payer: Self-pay | Admitting: Pharmacy Technician

## 2023-03-03 NOTE — Telephone Encounter (Signed)
Patient has new insurance and Wilber Bihari has been denied.    I have informed MD office (Dr. Talmage Nap / Adelina Mings RN) and faxed the letter to MD office.They will need to do an appeal. Fax: [phone] Phone: [phone] Left message with Boyd Kerbs  Patient received a sample on 03/01/23 (patient will not be charged) Fyi flag was entered for 1x dose and now has been removed.

## 2023-03-04 ENCOUNTER — Telehealth: Payer: Self-pay | Admitting: Pharmacy Technician

## 2023-03-04 NOTE — Telephone Encounter (Addendum)
[employer]ia Pruitt foundation: approved 03/23/23 - 03/21/24  Id: [id number] Phone: [phone]  Grant amount: $[id number]

## 2023-03-11 DIAGNOSIS — I639 Cerebral infarction, unspecified: Secondary | ICD-10-CM | POA: Diagnosis not present

## 2023-03-11 DIAGNOSIS — E7800 Pure hypercholesterolemia, unspecified: Secondary | ICD-10-CM | POA: Diagnosis not present

## 2023-03-11 DIAGNOSIS — E1165 Type 2 diabetes mellitus with hyperglycemia: Secondary | ICD-10-CM | POA: Diagnosis not present

## 2023-03-24 DIAGNOSIS — Z789 Other specified health status: Secondary | ICD-10-CM | POA: Diagnosis not present

## 2023-03-24 DIAGNOSIS — E7800 Pure hypercholesterolemia, unspecified: Secondary | ICD-10-CM | POA: Diagnosis not present

## 2023-03-24 DIAGNOSIS — E1165 Type 2 diabetes mellitus with hyperglycemia: Secondary | ICD-10-CM | POA: Diagnosis not present

## 2023-03-24 DIAGNOSIS — I639 Cerebral infarction, unspecified: Secondary | ICD-10-CM | POA: Diagnosis not present

## 2023-03-24 DIAGNOSIS — I10 Essential (primary) hypertension: Secondary | ICD-10-CM | POA: Diagnosis not present

## 2023-03-24 DIAGNOSIS — Z8673 Personal history of transient ischemic attack (TIA), and cerebral infarction without residual deficits: Secondary | ICD-10-CM | POA: Diagnosis not present

## 2023-03-24 DIAGNOSIS — E0840 Diabetes mellitus due to underlying condition with diabetic neuropathy, unspecified: Secondary | ICD-10-CM | POA: Diagnosis not present

## 2023-03-30 ENCOUNTER — Encounter: Payer: Self-pay | Admitting: Internal Medicine

## 2023-03-30 ENCOUNTER — Ambulatory Visit: Admitting: Internal Medicine

## 2023-03-30 VITALS — BP 119/71 | HR 94 | Temp 97.7°F | Ht 70.0 in | Wt 183.4 lb

## 2023-03-30 DIAGNOSIS — K625 Hemorrhage of anus and rectum: Secondary | ICD-10-CM

## 2023-03-30 DIAGNOSIS — Z860100 Personal history of colon polyps, unspecified: Secondary | ICD-10-CM | POA: Diagnosis not present

## 2023-03-30 DIAGNOSIS — Z09 Encounter for follow-up examination after completed treatment for conditions other than malignant neoplasm: Secondary | ICD-10-CM | POA: Diagnosis not present

## 2023-03-30 DIAGNOSIS — K219 Gastro-esophageal reflux disease without esophagitis: Secondary | ICD-10-CM

## 2023-03-30 NOTE — Progress Notes (Signed)
 Primary Care Physician:  Assunta Found, MD Primary Gastroenterologist:  Dr.   Pre-Procedure History & Physical: HPI:  David Pruitt is a 78 y.o. male here for for an episode of fecal urgency diarrhea and some paper hematochezia last week.  Was doing well on fiber Gummies daily 2-day history of diarrhea with some rectal bleeding.  No abdominal pain no fever or chills.  No melena.  History of GI bleeding for which is thoroughly evaluated over the past couple years with EGD colonoscopy VCE felt to have diverticular etiology.  Also, multiple colonic polyps removed previously was slated originally for surveillance colonoscopy this year.  Reflux well-controlled on pantoprazole 40 mg daily.  Fairly recent H&H was normal (dating last week's episode).  Past Medical History:  Diagnosis Date   CKD (chronic kidney disease), stage IIIa (HCC) 08/15/2020   CVA (cerebral infarction)    2006   DM (dermatomyositis)    Gastritis    GERD (gastroesophageal reflux disease)    Hemorrhoids    Hypercholesteremia    Hypertension    Lower GI bleed    SECONDARY TO DIVERTICULA,S/P BLEED   Overweight (BMI 25.0-29.9) 08/15/2020   Shortness of breath dyspnea    Type 2 diabetes mellitus (HCC)     Past Surgical History:  Procedure Laterality Date   COLONOSCOPY  12/2009   RMR: Lower GI bleed due to a single diverticulum status post Endo clipping.  Scattered diverticulosis.   COLONOSCOPY N/A 05/24/2014   Procedure: COLONOSCOPY;  Surgeon: Malissa Hippo, MD;  [facility];  Service: Endoscopy;  Laterality: N/A;  1030   COLONOSCOPY N/A 03/15/2019   Diverticulosis, 5 polyps removed.  Pathology revealed tubular adenomas.  Next colonoscopy in 3 years.   COLONOSCOPY WITH PROPOFOL N/A 08/16/2020   Procedure: COLONOSCOPY WITH PROPOFOL;  Surgeon: Malissa Hippo, MD;  [facility];  Service: Endoscopy;  Laterality: N/A;   ESOPHAGOGASTRODUODENOSCOPY  12/2009   RMR: Mild esophagitis, gastritis,  duodenitis.  Gastric biopsies negative for H. pylori   ESOPHAGOGASTRODUODENOSCOPY (EGD) WITH PROPOFOL N/A 08/15/2020   Procedure: ESOPHAGOGASTRODUODENOSCOPY (EGD) WITH PROPOFOL;  Surgeon: Corbin Ade, MD;  [facility];  Service: Endoscopy;  Laterality: N/A;  covid + (asymptomatic)   GIVENS CAPSULE STUDY N/A 08/29/2020   Procedure: GIVENS CAPSULE STUDY;  Surgeon: Malissa Hippo, MD;  [facility];  Service: Endoscopy;  Laterality: N/A;   POLYPECTOMY  03/15/2019   Procedure: POLYPECTOMY;  Surgeon: Corbin Ade, MD;  [facility];  Service: Endoscopy;;    Prior to Admission medications   Medication Sig Start Date End Date Taking? Authorizing Provider  clopidogrel (PLAVIX) 75 MG tablet Take 1 tablet (75 mg total) by mouth daily. 08/19/20  Yes Vassie Loll, MD  Coenzyme Q10-Fish Oil-Vit E (CO-Q 10 OMEGA-3 FISH OIL) CAPS Take 1 capsule by mouth daily.   Yes [provider]  gabapentin (NEURONTIN) 100 MG capsule Take 100 mg by mouth 3 (three) times daily. 10/22/21  Yes [provider]  glipiZIDE (GLUCOTROL XL) 5 MG 24 hr tablet Take 5 mg by mouth daily.   Yes [provider]  glucosamine-chondroitin 500-400 MG tablet Take 1 tablet by mouth daily.   Yes [provider]  metFORMIN (GLUCOPHAGE-XR) 500 MG 24 hr tablet Take 500 mg by mouth daily. 10/28/21  Yes [provider]  metoprolol succinate (TOPROL-XL) 50 MG 24 hr tablet Take 50 mg by mouth daily. 10/23/19  Yes [provider]  Multiple Vitamins-Minerals (MULTIVITAMIN WITH MINERALS) tablet Take 1 tablet by mouth daily.   Yes [provider]  pantoprazole (PROTONIX) 40 MG tablet Take 1 tablet (40 mg total) by mouth daily. Patient taking differently: Take 40 mg by mouth daily as needed (reflux). 04/29/21  Yes Paizleigh Wilds, Gerrit Friends, MD  Semaglutide, 2 MG/DOSE, (OZEMPIC, 2 MG/DOSE,) 8 MG/3ML SOPN Inject 2 mg into the skin once a week. 10/02/21  Yes   Turmeric  Curcumin 500 MG CAPS Take 1 capsule by mouth daily.   Yes [provider]  vitamin B-12 (CYANOCOBALAMIN) 1000 MCG tablet Take 1,000 mcg daily by mouth.   Yes [provider]    Allergies as of 03/30/2023 - Review Complete 03/30/2023  Allergen Reaction Noted   Dapagliflozin  10/31/2021   Statins  10/31/2021    Family History  Problem Relation Age of Onset   Colon cancer Father    Stroke Father     Social History   Socioeconomic History   Marital status: Married    Spouse name: Not on file   Number of children: Not on file   Years of education: Not on file   Highest education level: Not on file  Occupational History   Not on file  Tobacco Use   Smoking status: Former    Current packs/day: 1.00    Average packs/day: 1 pack/day for 40.0 years (40.0 ttl pk-yrs)    Types: Cigarettes   Smokeless tobacco: Never  Vaping Use   Vaping status: Never Used  Substance and Sexual Activity   Alcohol use: Yes    Comment: occasionally   Drug use: No   Sexual activity: Yes    Partners: Female    Comment: spouse  Other Topics Concern   Not on file  Social History Narrative   Not on file   Social Drivers of Health   Financial Resource Strain: Not on file  Food Insecurity: No Food Insecurity (07/28/2022)   Hunger Vital Sign    Worried About Running Out of Food in the Last Year: Never true    Ran Out of Food in the Last Year: Never true  Transportation Needs: No Transportation Needs (07/28/2022)   PRAPARE - Administrator, Civil Service (Medical): No    Lack of Transportation (Non-Medical): No  Physical Activity: Not on file  Stress: Not on file  Social Connections: Not on file  Intimate Partner Violence: Not At Risk (07/28/2022)   Humiliation, Afraid, Rape, and Kick questionnaire    Fear of Current or Ex-Partner: No    Emotionally Abused: No    Physically Abused: No    Sexually Abused: No    Review of Systems: See HPI, otherwise negative  ROS  Physical Exam: BP 119/71 (BP Location: Left Arm, Patient Position: Sitting, Cuff Size: Normal)   Pulse 94   Temp 97.7 F (36.5 C) (Oral)   Ht 5\' 10"  (1.778 m)   Wt 183 lb 6.4 oz (83.2 kg)   SpO2 99%   BMI 26.32 kg/m  General:   Alert,  Well-developed, well-nourished, pleasant and cooperative in NAD Neck:  Supple; no masses or thyromegaly. No significant cervical adenopathy. Lungs:  Clear throughout to auscultation.   No wheezes, crackles, or rhonchi. No acute distress. Heart:  Regular rate and rhythm; no murmurs, clicks, rubs,  or gallops. Abdomen: Non-distended, normal bowel sounds.  Soft and nontender without appreciable mass or hepatosplenomegaly.  Rectal: Does have grade 3 hemorrhoids.  Nontender exam no mass in rectal  vault scant brown stool is heme strongly Hemoccult positive  Impression/Plan: 78 year old gentleman with abdominal cramps and diarrhea urgency last week punctuated by periods of paper hematochezia.  At this point no abdominal pain.  Feels like he needs to strain on occasion.  Urgency has resolved.  May be dealing with food intolerance or low-grade food borne illness with anorectal bleeding.  Does not sound like colitis necessarily.  He has a history of GI bleeding multiple colonic polyps removed previously. He needs a better fiber supplement  Recommendations:  Stop fiber Gummies; begin Metamucil daily wafers, caplets, powder okay Schedule a diagnostic colonoscopy for rectal bleeding in the near future (ASA 3).  The risks, benefits, limitations, alternatives and imponderables have been reviewed with the patient. Questions have been answered. All parties are agreeable.       Notice: This dictation was prepared with Dragon dictation along with smaller phrase technology. Any transcriptional errors that result from this process are unintentional and may not be corrected upon review.

## 2023-03-30 NOTE — Patient Instructions (Signed)
 It was good to see you again today  You do have hemorrhoids which may be contributing to your bleeding.  However, given your history of multiple colon polyps removed previously, I recommend a diagnostic colonoscopy now (hematochezia) ASA 3  Stop fiber Gummies; begin Metamucil powder, wafers or caplets.  1 dose daily  Further recommendations to follow.

## 2023-04-21 ENCOUNTER — Other Ambulatory Visit: Payer: Self-pay | Admitting: *Deleted

## 2023-04-21 ENCOUNTER — Encounter: Payer: Self-pay | Admitting: *Deleted

## 2023-04-21 ENCOUNTER — Telehealth: Payer: Self-pay | Admitting: *Deleted

## 2023-04-21 MED ORDER — PEG 3350-KCL-NA BICARB-NACL 420 G PO SOLR: 4000.0000 mL | Freq: Once | ORAL | 0 refills | Status: AC

## 2023-04-21 NOTE — Telephone Encounter (Signed)
 Pt called to reschedule from 05/24/23. He says his wife has a doctors appointment that morning. He has been rescheduled to 05/26/23 at 9 am. Updated instructions mailed.

## 2023-04-22 DIAGNOSIS — J069 Acute upper respiratory infection, unspecified: Secondary | ICD-10-CM | POA: Diagnosis not present

## 2023-04-22 DIAGNOSIS — R6889 Other general symptoms and signs: Secondary | ICD-10-CM | POA: Diagnosis not present

## 2023-05-05 DIAGNOSIS — C4402 Squamous cell carcinoma of skin of lip: Secondary | ICD-10-CM | POA: Diagnosis not present

## 2023-05-05 DIAGNOSIS — D3701 Neoplasm of uncertain behavior of lip: Secondary | ICD-10-CM | POA: Diagnosis not present

## 2023-05-19 ENCOUNTER — Encounter (HOSPITAL_COMMUNITY): Payer: Self-pay

## 2023-05-19 NOTE — Patient Instructions (Signed)
 David Pruitt  05/19/2023     @PREFPERIOPPHARMACY @   Your procedure is scheduled on  05/26/2023.   Report to [facility] at  0700  A.M.   Call this number if you have problems the morning of surgery:  [phone]  If you experience any cold or flu symptoms such as cough, fever, chills, shortness of breath, etc. between now and your scheduled surgery, please notify us  at the above number.   Remember:        Your last dose of ozempic  should have been on 05/18/2023.           DO NOT take any medications for diabetes the morning of your procedure.   Follow the diet and prep instructions given to you by the office.   You may drink clear liquids until  0500 am on 05/26/2023.    Clear liquids allowed are:                    Water , Juice (No red color; non-citric and without pulp; diabetics please choose diet or no sugar options), Carbonated beverages (diabetics please choose diet or no sugar options), Clear Tea (No creamer, milk, or cream, including half & half and powdered creamer), Black Coffee Only (No creamer, milk or cream, including half & half and powdered creamer), Plain Jell-O Only (No red color; diabetics please choose no sugar options), Clear Sports drink (No red color; diabetics please choose diet or no sugar options), and Plain Popsicles Only (No red color; diabetics please choose no sugar options)    Take these medicines the morning of surgery with A SIP OF WATER                              gabapentin , metoprolol , pantoprazole .    Do not wear jewelry, make-up or nail polish, including gel polish,  artificial nails, or any other type of covering on natural nails (fingers and  toes).  Do not wear lotions, powders, or perfumes, or deodorant.  Do not shave 48 hours prior to surgery.  Men may shave face and neck.  Do not bring valuables to the hospital.  [facility] is not responsible for any belongings or valuables.  Contacts, dentures or bridgework may not be  worn into surgery.  Leave your suitcase in the car.  After surgery it may be brought to your room.  For patients admitted to the hospital, discharge time will be determined by your treatment team.  Patients discharged the day of surgery will not be allowed to drive home and must have someone with them for 24 hours.    Special instructions:   DO NOT smoke tobacco or vape for 24 hours before your procedure.  Please read over the following fact sheets that you were given. Anesthesia Post-op Instructions and Care and Recovery After Surgery      Colonoscopy, Adult, Care After The following information offers guidance on how to care for yourself after your procedure. Your health care provider may also give you more specific instructions. If you have problems or questions, contact your health care provider. What can I expect after the procedure? After the procedure, it is common to have: A small amount of blood in your stool for 24 hours after the procedure. Some gas. Mild cramping or bloating of your abdomen. Follow these instructions at home: Eating and drinking  Drink enough fluid to keep your urine  pale yellow. Follow instructions from your health care provider about eating or drinking restrictions. Resume your normal diet as told by your health care provider. Avoid heavy or fried foods that are hard to digest. Activity Rest as told by your health care provider. Avoid sitting for a long time without moving. Get up to take short walks every 1-2 hours. This is important to improve blood flow and breathing. Ask for help if you feel weak or unsteady. Return to your normal activities as told by your health care provider. Ask your health care provider what activities are safe for you. Managing cramping and bloating  Try walking around when you have cramps or feel bloated. If directed, apply heat to your abdomen as told by your health care provider. Use the heat source that your health care  provider recommends, such as a moist heat pack or a heating pad. Place a towel between your skin and the heat source. Leave the heat on for 20-30 minutes. Remove the heat if your skin turns bright red. This is especially important if you are unable to feel pain, heat, or cold. You have a greater risk of getting burned. General instructions If you were given a sedative during the procedure, it can affect you for several hours. Do not drive or operate machinery until your health care provider says that it is safe. For the first 24 hours after the procedure: Do not sign important documents. Do not drink alcohol. Do your regular daily activities at a slower pace than normal. Eat soft foods that are easy to digest. Take over-the-counter and prescription medicines only as told by your health care provider. Keep all follow-up visits. This is important. Contact a health care provider if: You have blood in your stool 2-3 days after the procedure. Get help right away if: You have more than a small spotting of blood in your stool. You have large blood clots in your stool. You have swelling of your abdomen. You have nausea or vomiting. You have a fever. You have increasing pain in your abdomen that is not relieved with medicine. These symptoms may be an emergency. Get help right away. Call 911. Do not wait to see if the symptoms will go away. Do not drive yourself to the hospital. Summary After the procedure, it is common to have a small amount of blood in your stool. You may also have mild cramping and bloating of your abdomen. If you were given a sedative during the procedure, it can affect you for several hours. Do not drive or operate machinery until your health care provider says that it is safe. Get help right away if you have a lot of blood in your stool, nausea or vomiting, a fever, or increased pain in your abdomen. This information is not intended to replace advice given to you by your  health care provider. Make sure you discuss any questions you have with your health care provider. Document Revised: 02/10/2022 Document Reviewed: 08/21/2020 Elsevier Patient Education  2024 [employer]General Anesthesia, Adult, Care After The following information offers guidance on how to care for yourself after your procedure. Your health care provider may also give you more specific instructions. If you have problems or questions, contact your health care provider. What can I expect after the procedure? After the procedure, it is common for people to: Have pain or discomfort at the IV site. Have nausea or vomiting. Have a sore throat or hoarseness. Have trouble concentrating. Feel cold or chills. Feel  weak, sleepy, or tired (fatigue). Have soreness and body aches. These can affect parts of the body that were not involved in surgery. Follow these instructions at home: For the time period you were told by your health care provider:  Rest. Do not participate in activities where you could fall or become injured. Do not drive or use machinery. Do not drink alcohol. Do not take sleeping pills or medicines that cause drowsiness. Do not make important decisions or sign legal documents. Do not take care of children on your own. General instructions Drink enough fluid to keep your urine pale yellow. If you have sleep apnea, surgery and certain medicines can increase your risk for breathing problems. Follow instructions from your health care provider about wearing your sleep device: Anytime you are sleeping, including during daytime naps. While taking prescription pain medicines, sleeping medicines, or medicines that make you drowsy. Return to your normal activities as told by your health care provider. Ask your health care provider what activities are safe for you. Take over-the-counter and prescription medicines only as told by your health care provider. Do not use any products that  contain nicotine or tobacco. These products include cigarettes, chewing tobacco, and vaping devices, such as e-cigarettes. These can delay incision healing after surgery. If you need help quitting, ask your health care provider. Contact a health care provider if: You have nausea or vomiting that does not get better with medicine. You vomit every time you eat or drink. You have pain that does not get better with medicine. You cannot urinate or have bloody urine. You develop a skin rash. You have a fever. Get help right away if: You have trouble breathing. You have chest pain. You vomit blood. These symptoms may be an emergency. Get help right away. Call 911. Do not wait to see if the symptoms will go away. Do not drive yourself to the hospital. Summary After the procedure, it is common to have a sore throat, hoarseness, nausea, vomiting, or to feel weak, sleepy, or fatigue. For the time period you were told by your health care provider, do not drive or use machinery. Get help right away if you have difficulty breathing, have chest pain, or vomit blood. These symptoms may be an emergency. This information is not intended to replace advice given to you by your health care provider. Make sure you discuss any questions you have with your health care provider. Document Revised: 03/28/2021 Document Reviewed: 03/28/2021 Elsevier Patient Education  2024 [employer].

## 2023-05-20 ENCOUNTER — Encounter: Payer: Self-pay | Admitting: Pulmonary Disease

## 2023-05-20 ENCOUNTER — Encounter (HOSPITAL_COMMUNITY)
Admission: RE | Admit: 2023-05-20 | Discharge: 2023-05-20 | Disposition: A | Discharge status: Home / Self Care | Source: Ambulatory Visit | Attending: Internal Medicine | Admitting: Internal Medicine

## 2023-05-20 ENCOUNTER — Encounter (HOSPITAL_COMMUNITY): Payer: Self-pay

## 2023-05-20 VITALS — BP 130/83 | HR 89 | Temp 98.0°F | Resp 18 | Ht 70.0 in | Wt 183.4 lb

## 2023-05-20 DIAGNOSIS — E119 Type 2 diabetes mellitus without complications: Secondary | ICD-10-CM | POA: Diagnosis not present

## 2023-05-20 DIAGNOSIS — Z01812 Encounter for preprocedural laboratory examination: Secondary | ICD-10-CM | POA: Insufficient documentation

## 2023-05-20 LAB — BASIC METABOLIC PANEL WITH GFR
Anion gap: 8 (ref 5–15)
BUN: 27 mg/dL — ABNORMAL HIGH (ref 8–23)
CO2: 26 mmol/L (ref 22–32)
Calcium: 9.8 mg/dL (ref 8.9–10.3)
Chloride: 100 mmol/L (ref 98–111)
Creatinine, Ser: 1.27 mg/dL — ABNORMAL HIGH (ref 0.61–1.24)
GFR, Estimated: 59 mL/min — ABNORMAL LOW (ref >60)
Glucose, Bld: 160 mg/dL — ABNORMAL HIGH (ref 70–99)
Potassium: 4.2 mmol/L (ref 3.5–5.1)
Sodium: 134 mmol/L — ABNORMAL LOW (ref 135–145)

## 2023-05-25 ENCOUNTER — Telehealth: Payer: Self-pay | Admitting: *Deleted

## 2023-05-25 NOTE — Telephone Encounter (Signed)
 Pt informed that procedure for tomorrow will be cancelled, will call back with date and time that we can get him rescheduled to.

## 2023-05-25 NOTE — Progress Notes (Signed)
 Spoke with patient, informed him his procedure was canceled for 05/26/23 due to a water  issue at the hospital.  He stated Dr. Drucilla Georgis office had already called him.  Patient voiced understanding.

## 2023-05-25 NOTE — Telephone Encounter (Signed)
 Spoke with pt. He has been rescheduled for 5/22. Ok rm 1/2 per CRNA at [facility]. Pt aware will send new instructions to his mychart

## 2023-05-31 DIAGNOSIS — L814 Other melanin hyperpigmentation: Secondary | ICD-10-CM | POA: Diagnosis not present

## 2023-05-31 DIAGNOSIS — Z08 Encounter for follow-up examination after completed treatment for malignant neoplasm: Secondary | ICD-10-CM | POA: Diagnosis not present

## 2023-05-31 DIAGNOSIS — C001 Malignant neoplasm of external lower lip: Secondary | ICD-10-CM | POA: Diagnosis not present

## 2023-05-31 DIAGNOSIS — Z85828 Personal history of other malignant neoplasm of skin: Secondary | ICD-10-CM | POA: Diagnosis not present

## 2023-05-31 DIAGNOSIS — L821 Other seborrheic keratosis: Secondary | ICD-10-CM | POA: Diagnosis not present

## 2023-05-31 DIAGNOSIS — D225 Melanocytic nevi of trunk: Secondary | ICD-10-CM | POA: Diagnosis not present

## 2023-06-03 ENCOUNTER — Ambulatory Visit (HOSPITAL_COMMUNITY)

## 2023-06-03 ENCOUNTER — Encounter (HOSPITAL_COMMUNITY)
Admission: RE | Disposition: A | Discharge status: Home / Self Care | Payer: Self-pay | Source: Ambulatory Visit | Attending: Internal Medicine

## 2023-06-03 ENCOUNTER — Other Ambulatory Visit: Payer: Self-pay

## 2023-06-03 ENCOUNTER — Encounter (HOSPITAL_COMMUNITY): Payer: Self-pay | Admitting: Internal Medicine

## 2023-06-03 ENCOUNTER — Ambulatory Visit (HOSPITAL_COMMUNITY)
Admission: RE | Admit: 2023-06-03 | Discharge: 2023-06-03 | Disposition: A | Discharge status: Home / Self Care | Source: Ambulatory Visit | Attending: Internal Medicine | Admitting: Internal Medicine

## 2023-06-03 ENCOUNTER — Ambulatory Visit (HOSPITAL_BASED_OUTPATIENT_CLINIC_OR_DEPARTMENT_OTHER)

## 2023-06-03 DIAGNOSIS — I10 Essential (primary) hypertension: Secondary | ICD-10-CM | POA: Diagnosis not present

## 2023-06-03 DIAGNOSIS — K219 Gastro-esophageal reflux disease without esophagitis: Secondary | ICD-10-CM | POA: Diagnosis not present

## 2023-06-03 DIAGNOSIS — E1122 Type 2 diabetes mellitus with diabetic chronic kidney disease: Secondary | ICD-10-CM | POA: Diagnosis not present

## 2023-06-03 DIAGNOSIS — K5730 Diverticulosis of large intestine without perforation or abscess without bleeding: Secondary | ICD-10-CM

## 2023-06-03 DIAGNOSIS — Z7984 Long term (current) use of oral hypoglycemic drugs: Secondary | ICD-10-CM | POA: Insufficient documentation

## 2023-06-03 DIAGNOSIS — E119 Type 2 diabetes mellitus without complications: Secondary | ICD-10-CM | POA: Diagnosis not present

## 2023-06-03 DIAGNOSIS — N1831 Chronic kidney disease, stage 3a: Secondary | ICD-10-CM | POA: Insufficient documentation

## 2023-06-03 DIAGNOSIS — D123 Benign neoplasm of transverse colon: Secondary | ICD-10-CM

## 2023-06-03 DIAGNOSIS — K920 Hematemesis: Secondary | ICD-10-CM

## 2023-06-03 DIAGNOSIS — I129 Hypertensive chronic kidney disease with stage 1 through stage 4 chronic kidney disease, or unspecified chronic kidney disease: Secondary | ICD-10-CM | POA: Diagnosis not present

## 2023-06-03 DIAGNOSIS — Z87891 Personal history of nicotine dependence: Secondary | ICD-10-CM | POA: Insufficient documentation

## 2023-06-03 DIAGNOSIS — K635 Polyp of colon: Secondary | ICD-10-CM | POA: Diagnosis not present

## 2023-06-03 DIAGNOSIS — K921 Melena: Secondary | ICD-10-CM | POA: Diagnosis not present

## 2023-06-03 DIAGNOSIS — G4730 Sleep apnea, unspecified: Secondary | ICD-10-CM | POA: Insufficient documentation

## 2023-06-03 HISTORY — PX: COLONOSCOPY: SHX5424

## 2023-06-03 LAB — GLUCOSE, CAPILLARY: Glucose-Capillary: 174 mg/dL — ABNORMAL HIGH (ref 70–99)

## 2023-06-03 SURGERY — COLONOSCOPY
Anesthesia: General

## 2023-06-03 MED ORDER — PHENYLEPHRINE 80 MCG/ML (10ML) SYRINGE FOR IV PUSH (FOR BLOOD PRESSURE SUPPORT)
PREFILLED_SYRINGE | INTRAVENOUS | Status: DC | PRN
  Administered 2023-06-03: 80 ug via INTRAVENOUS
  Administered 2023-06-03 (×2): 160 ug via INTRAVENOUS

## 2023-06-03 MED ORDER — LIDOCAINE 2% (20 MG/ML) 5 ML SYRINGE
INTRAMUSCULAR | Status: DC | PRN
Start: 2023-06-03 — End: 2023-06-03
  Administered 2023-06-03: 60 mg via INTRAVENOUS

## 2023-06-03 MED ORDER — EPHEDRINE 5 MG/ML INJ
INTRAVENOUS | Status: AC
  Filled 2023-06-03: qty 5

## 2023-06-03 MED ORDER — PHENYLEPHRINE 80 MCG/ML (10ML) SYRINGE FOR IV PUSH (FOR BLOOD PRESSURE SUPPORT)
PREFILLED_SYRINGE | INTRAVENOUS | Status: AC
  Filled 2023-06-03: qty 10

## 2023-06-03 MED ORDER — EPHEDRINE SULFATE-NACL 50-0.9 MG/10ML-% IV SOSY
PREFILLED_SYRINGE | INTRAVENOUS | Status: DC | PRN
  Administered 2023-06-03 (×2): 5 mg via INTRAVENOUS

## 2023-06-03 MED ORDER — PROPOFOL 500 MG/50ML IV EMUL
INTRAVENOUS | Status: DC | PRN
Start: 2023-06-03 — End: 2023-06-03
  Administered 2023-06-03: 150 ug/kg/min via INTRAVENOUS

## 2023-06-03 MED ORDER — LACTATED RINGERS IV SOLN: INTRAVENOUS | Status: DC

## 2023-06-03 MED ORDER — PROPOFOL 10 MG/ML IV BOLUS
INTRAVENOUS | Status: DC | PRN
  Administered 2023-06-03: 70 mg via INTRAVENOUS

## 2023-06-03 MED ORDER — LIDOCAINE 2% (20 MG/ML) 5 ML SYRINGE
INTRAMUSCULAR | Status: AC
  Filled 2023-06-03: qty 5

## 2023-06-03 NOTE — Transfer of Care (Signed)
 Immediate Anesthesia Transfer of Care Note  Patient: David Pruitt  Procedure(s) Performed: COLONOSCOPY  Patient Location: Endoscopy Unit  Anesthesia Type:General  Level of Consciousness: drowsy and patient cooperative  Airway & Oxygen  Therapy: Patient Spontanous Breathing  Post-op Assessment: Report given to RN and Post -op Vital signs reviewed and stable  Post vital signs: Reviewed and stable  Last Vitals:  Vitals Value Taken Time  BP 97/60 06/03/23 1214  Temp 36.5 C 06/03/23 1208  Pulse 92 06/03/23 1214  Resp 10 06/03/23 1214  SpO2 98 % 06/03/23 1214    Last Pain:  Vitals:   06/03/23 1214  TempSrc:   PainSc: 0-No pain      Patients Stated Pain Goal: 8 (06/03/23 1014)  Complications: No notable events documented.

## 2023-06-03 NOTE — Discharge Instructions (Addendum)
  Colonoscopy Discharge Instructions  Read the instructions outlined below and refer to this sheet in the next few weeks. These discharge instructions provide you with general information on caring for yourself after you leave the hospital. Your doctor may also give you specific instructions. While your treatment has been planned according to the most current medical practices available, unavoidable complications occasionally occur. If you have any problems or questions after discharge, call Dr. Riley Cheadle at [phone]. ACTIVITY You may resume your regular activity, but move at a slower pace for the next 24 hours.  Take frequent rest periods for the next 24 hours.  Walking will help get rid of the air and reduce the bloated feeling in your belly (abdomen).  No driving for 24 hours (because of the medicine (anesthesia) used during the test).   Do not sign any important legal documents or operate any machinery for 24 hours (because of the anesthesia used during the test).  NUTRITION Drink plenty of fluids.  You may resume your normal diet as instructed by your doctor.  Begin with a light meal and progress to your normal diet. Heavy or fried foods are harder to digest and may make you feel sick to your stomach (nauseated).  Avoid alcoholic beverages for 24 hours or as instructed.  MEDICATIONS You may resume your normal medications unless your doctor tells you otherwise.  WHAT YOU CAN EXPECT TODAY Some feelings of bloating in the abdomen.  Passage of more gas than usual.  Spotting of blood in your stool or on the toilet paper.  IF YOU HAD POLYPS REMOVED DURING THE COLONOSCOPY: No aspirin products for 7 days or as instructed.  No alcohol for 7 days or as instructed.  Eat a soft diet for the next 24 hours.  FINDING OUT THE RESULTS OF YOUR TEST Not all test results are available during your visit. If your test results are not back during the visit, make an appointment with your caregiver to find out the  results. Do not assume everything is normal if you have not heard from your caregiver or the medical facility. It is important for you to follow up on all of your test results.  SEEK IMMEDIATE MEDICAL ATTENTION IF: You have more than a spotting of blood in your stool.  Your belly is swollen (abdominal distention).  You are nauseated or vomiting.  You have a temperature over 101.  You have abdominal pain or discomfort that is severe or gets worse throughout the day.       2 polyps removed.  Diverticulosis and polyp information provided   further recommendations to follow pending review of pathology report

## 2023-06-03 NOTE — Anesthesia Postprocedure Evaluation (Signed)
 Anesthesia Post Note  Patient: David Pruitt  Procedure(s) Performed: COLONOSCOPY  Patient location during evaluation: PACU Anesthesia Type: General Level of consciousness: awake and alert Pain management: pain level controlled Vital Signs Assessment: post-procedure vital signs reviewed and stable Respiratory status: spontaneous breathing, nonlabored ventilation, respiratory function stable and patient connected to nasal cannula oxygen  Cardiovascular status: blood pressure returned to baseline and stable Postop Assessment: no apparent nausea or vomiting Anesthetic complications: no  No notable events documented.   Last Vitals:  Vitals:   06/03/23 1208 06/03/23 1214  BP: (!) 121/52 97/60  Pulse: 84 92  Resp: 10 10  Temp: 36.5 C   SpO2: 97% 98%    Last Pain:  Vitals:   06/03/23 1214  TempSrc:   PainSc: 0-No pain                 Beacher Limerick

## 2023-06-03 NOTE — Op Note (Signed)
 [facility] Patient Name: David Pruitt Procedure Date: 06/03/2023 11:20 AM MRN: [id number] Date of Birth: 28-Jan-1945 Attending MD: Gemma Kelp , MD, [id number] CSN: [id number] Age: 78 Admit Type: Outpatient Procedure:                Colonoscopy Indications:              Hematochezia Providers:                Gemma Kelp, MD, Troy Furnish. Hazeline Lister RN, RN,                            Alisa App, Annell Barrow Referring MD:              Medicines:                Propofol  per Anesthesia Complications:            No immediate complications. Estimated Blood Loss:     Estimated blood loss was minimal. Procedure:                Pre-Anesthesia Assessment:                           - Prior to the procedure, a History and Physical                            was performed, and patient medications and                            allergies were reviewed. The patient's tolerance of                            previous anesthesia was also reviewed. The risks                            and benefits of the procedure and the sedation                            options and risks were discussed with the patient.                            All questions were answered, and informed consent                            was obtained. Prior Anticoagulants: The patient has                            taken no anticoagulant or antiplatelet agents. ASA                            Grade Assessment: III - A patient with severe                            systemic disease. After reviewing the risks and  benefits, the patient was deemed in satisfactory                            condition to undergo the procedure.                           After obtaining informed consent, the colonoscope                            was passed under direct vision. Throughout the                            procedure, the patient's blood pressure, pulse, and                            oxygen   saturations were monitored continuously. The                            [id number]) scope was introduced through the                            anus and advanced to the the cecum, identified by                            appendiceal orifice and ileocecal valve. The                            colonoscopy was performed without difficulty. The                            patient tolerated the procedure well. The quality                            of the bowel preparation was adequate. The patient                            tolerated the procedure well. The entire colon was                            well visualized. Scope In: 11:37:38 AM Scope Out: 12:03:05 PM Scope Withdrawal Time: 0 hours 12 minutes 38 seconds  Total Procedure Duration: 0 hours 25 minutes 27 seconds  Findings:      A 5 mm polyp was found in the splenic flexure. The polyp was sessile.       The polyp was removed with a cold snare. Resection and retrieval were       complete. Estimated blood loss was minimal.      A 10 mm polyp was found in the hepatic flexure. The polyp was       semi-pedunculated. The polyp was removed with a hot snare. Resection and       retrieval were complete. Estimated blood loss was minimal.      Many medium-mouthed diverticula were found in the entire colon.      The exam was otherwise without abnormality on direct and retroflexion  views. Hot snare polypectomy site sealed with para stat. Impression:               - One 5 mm polyp at the splenic flexure, removed                            with a cold snare. Resected and retrieved.                           - One 10 mm polyp at the hepatic flexure, removed                            with a hot snare. Resected and retrieved.                           - Diverticulosis in the entire examined colon.                           - The examination was otherwise normal on direct                            and retroflexion views. Moderate  Sedation:      Moderate (conscious) sedation was personally administered by an       anesthesia professional. The following parameters were monitored: oxygen        saturation, heart rate, blood pressure, respiratory rate, EKG, adequacy       of pulmonary ventilation, and response to care. Recommendation:           - Written discharge instructions were provided to                            the patient.                           - The signs and symptoms of potential delayed                            complications were discussed with the patient.                           - Patient has a contact number available for                            emergencies.                           - Return to normal activities tomorrow.                           - Advance diet as tolerated.                           - Continue present medications.                           - Repeat colonoscopy date to be determined after  pending pathology results are reviewed for                            screening purposes.                           - Return to GI office (date not yet determined). Procedure Code(s):        --- Professional ---                           [phone], Colonoscopy, flexible; with removal of                            tumor(s), polyp(s), or other lesion(s) by snare                            technique Diagnosis Code(s):        --- Professional ---                           D12.3, Benign neoplasm of transverse colon (hepatic                            flexure or splenic flexure)                           K92.1, Melena (includes Hematochezia)                           K57.30, Diverticulosis of large intestine without                            perforation or abscess without bleeding CPT copyright 2022 American Medical Association. All rights reserved. The codes documented in this report are preliminary and upon coder review may  be revised to meet current compliance  requirements. Windsor Hatcher. Jerelle Virden, MD Gemma Kelp, MD 06/03/2023 12:12:33 PM This report has been signed electronically. Number of Addenda: 0

## 2023-06-03 NOTE — Anesthesia Preprocedure Evaluation (Addendum)
 Anesthesia Evaluation  Patient identified by MRN, date of birth, ID band Patient awake    Reviewed: Allergy & Precautions, H&P , NPO status , Patient's Chart, lab work & pertinent test results  Airway Mallampati: II  TM Distance: >3 FB Neck ROM: Full    Dental no notable dental hx.    Pulmonary shortness of breath, sleep apnea , former smoker   Pulmonary exam normal breath sounds clear to auscultation       Cardiovascular hypertension, Normal cardiovascular exam Rhythm:Regular Rate:Tachycardia     Neuro/Psych  Neuromuscular disease  negative psych ROS   GI/Hepatic Neg liver ROS,GERD  ,,  Endo/Other  diabetes, Oral Hypoglycemic Agents    Renal/GU Renal disease     Musculoskeletal negative musculoskeletal ROS (+)    Abdominal   Peds negative pediatric ROS (+)  Hematology negative hematology ROS (+)   Anesthesia Other Findings   Reproductive/Obstetrics negative OB ROS                             Anesthesia Physical Anesthesia Plan  ASA: 3  Anesthesia Plan: General   Post-op Pain Management:    Induction: Intravenous  PONV Risk Score and Plan:   Airway Management Planned: Nasal Cannula  Additional Equipment:   Intra-op Plan:   Post-operative Plan:   Informed Consent: I have reviewed the patients History and Physical, chart, labs and discussed the procedure including the risks, benefits and alternatives for the proposed anesthesia with the patient or authorized representative who has indicated his/her understanding and acceptance.     Dental advisory given  Plan Discussed with: CRNA  Anesthesia Plan Comments:        Anesthesia Quick Evaluation

## 2023-06-03 NOTE — H&P (Signed)
 @LOGO @   Primary Care Physician:  Minus Amel, MD Primary Gastroenterologist:  Dr. Riley Cheadle  Pre-Procedure History & Physical: HPI:  David Pruitt is a 78 y.o. male here for  rectal bleeding.  History of colonic polyps.  Past Medical History:  Diagnosis Date   CKD (chronic kidney disease), stage IIIa (HCC) 08/15/2020   CVA (cerebral infarction)    2006   DM (dermatomyositis)    Gastritis    GERD (gastroesophageal reflux disease)    Hemorrhoids    Hypercholesteremia    Hypertension    Lower GI bleed    SECONDARY TO DIVERTICULA,S/P BLEED   Overweight (BMI 25.0-29.9) 08/15/2020   Shortness of breath dyspnea    Type 2 diabetes mellitus (HCC)     Past Surgical History:  Procedure Laterality Date   COLONOSCOPY  12/2009   RMR: Lower GI bleed due to a single diverticulum status post Endo clipping.  Scattered diverticulosis.   COLONOSCOPY N/A 05/24/2014   Procedure: COLONOSCOPY;  Surgeon: Ruby Corporal, MD;  [facility]: [facility];  Service: Endoscopy;  Laterality: N/A;  1030   COLONOSCOPY N/A 03/15/2019   Diverticulosis, 5 polyps removed.  Pathology revealed tubular adenomas.  Next colonoscopy in 3 years.   COLONOSCOPY WITH PROPOFOL  N/A 08/16/2020   Procedure: COLONOSCOPY WITH PROPOFOL ;  Surgeon: Ruby Corporal, MD;  Location: [facility];  Service: Endoscopy;  Laterality: N/A;   ESOPHAGOGASTRODUODENOSCOPY  12/2009   RMR: Mild esophagitis, gastritis, duodenitis.  Gastric biopsies negative for H. pylori   ESOPHAGOGASTRODUODENOSCOPY (EGD) WITH PROPOFOL  N/A 08/15/2020   Procedure: ESOPHAGOGASTRODUODENOSCOPY (EGD) WITH PROPOFOL ;  Surgeon: Suzette Espy, MD;  Location: [facility];  Service: Endoscopy;  Laterality: N/A;  covid + (asymptomatic)   GIVENS CAPSULE STUDY N/A 08/29/2020   Procedure: GIVENS CAPSULE STUDY;  Surgeon: Ruby Corporal, MD;  [facility]: [facility];  Service: Endoscopy;  Laterality: N/A;   POLYPECTOMY  03/15/2019   Procedure: POLYPECTOMY;  Surgeon: Suzette Espy, MD;  [facility]: [facility];  Service: Endoscopy;;    Prior to Admission medications   Medication Sig Start Date End Date Taking? Authorizing Provider  clopidogrel  (PLAVIX ) 75 MG tablet Take 1 tablet (75 mg total) by mouth daily. 08/19/20  Yes Justina Oman, MD  Coenzyme Q10-Fish Oil-Vit E (CO-Q 10 OMEGA-3 FISH OIL) CAPS Take 1 capsule by mouth daily.   Yes [provider]  gabapentin  (NEURONTIN ) 100 MG capsule Take 100 mg by mouth 3 (three) times daily. 10/22/21  Yes [provider]  glipiZIDE (GLUCOTROL XL) 5 MG 24 hr tablet Take 5 mg by mouth daily.   Yes [provider]  glucosamine-chondroitin 500-400 MG tablet Take 1 tablet by mouth daily.   Yes [provider]  metFORMIN (GLUCOPHAGE-XR) 500 MG 24 hr tablet Take 500 mg by mouth daily. 10/28/21  Yes [provider]  metoprolol  succinate (TOPROL -XL) 50 MG 24 hr tablet Take 50 mg by mouth daily. 10/23/19  Yes [provider]  Multiple Vitamins-Minerals (MULTIVITAMIN WITH MINERALS) tablet Take 1 tablet by mouth daily.   Yes [provider]  pantoprazole  (PROTONIX ) 40 MG tablet Take 1 tablet (40 mg total) by mouth daily. Patient taking differently: Take 40 mg by mouth daily as needed (reflux). 04/29/21  Yes Rudolfo Brandow, Windsor Hatcher, MD  Turmeric Curcumin 500 MG CAPS Take 1 capsule by mouth daily.   Yes [provider]  vitamin B-12 (CYANOCOBALAMIN ) 1000 MCG tablet Take 1,000 mcg daily by mouth.   Yes [provider]  Semaglutide , 2 MG/DOSE, (OZEMPIC , 2 MG/DOSE,) 8 MG/3ML SOPN Inject 2 mg into the skin once a week. 10/02/21       Allergies as of 04/21/2023 - Review Complete 03/30/2023  Allergen Reaction Noted   Dapagliflozin  10/31/2021   Statins  10/31/2021    Family History  Problem Relation Age of Onset   Colon cancer Father    Stroke Father     Social History   Socioeconomic History   Marital status: Married    Spouse name: Not on file   Number of  children: Not on file   Years of education: Not on file   Highest education level: Not on file  Occupational History   Not on file  Tobacco Use   Smoking status: Former    Current packs/day: 1.00    Average packs/day: 1 pack/day for 40.0 years (40.0 ttl pk-yrs)    Types: Cigarettes   Smokeless tobacco: Never  Vaping Use   Vaping status: Never Used  Substance and Sexual Activity   Alcohol use: Yes    Comment: occasionally   Drug use: No   Sexual activity: Yes    Partners: Female    Comment: spouse  Other Topics Concern   Not on file  Social History Narrative   Not on file   Social Drivers of Health   Financial Resource Strain: Not on file  Food Insecurity: No Food Insecurity (07/28/2022)   Hunger Vital Sign    Worried About Running Out of Food in the Last Year: Never true    Ran Out of Food in the Last Year: Never true  Transportation Needs: No Transportation Needs (07/28/2022)   PRAPARE - Administrator, Civil Service (Medical): No    Lack of Transportation (Non-Medical): No  Physical Activity: Not on file  Stress: Not on file  Social Connections: Not on file  Intimate Partner Violence: Not At Risk (07/28/2022)   Humiliation, Afraid, Rape, and Kick questionnaire    Fear of Current or Ex-Partner: No    Emotionally Abused: No    Physically Abused: No    Sexually Abused: No    Review of Systems: See HPI, otherwise negative ROS  Physical Exam: BP 113/72   Pulse 96   Temp 98.1 F (36.7 C) (Oral)   Resp 13   Wt 79.4 kg   SpO2 100%   BMI 25.11 kg/m  General:   Alert,  Well-developed, well-nourished, pleasant and cooperative in NAD Neck:  Supple; no masses or thyromegaly. No significant cervical adenopathy. Lungs:  Clear throughout to auscultation.   No wheezes, crackles, or rhonchi. No acute distress. Heart:  Regular rate and rhythm; no murmurs, clicks, rubs,  or gallops. Abdomen: Non-distended, normal bowel sounds.  Soft and nontender without  appreciable mass or hepatosplenomegaly.   Impression/Plan:    78 year old gentleman with history of colonic polyps presents with recent rectal bleeding.  Colonoscopy being performed today.  The risks, benefits, limitations, alternatives and imponderables have been reviewed with the patient. Questions have been answered. All parties are agreeable.      Since seen in the office patient states his bowel function is normalized.     Notice: This dictation was prepared with Dragon dictation along with smaller phrase technology. Any transcriptional errors that result from this process are unintentional and may not be corrected upon review.

## 2023-06-04 ENCOUNTER — Encounter (HOSPITAL_COMMUNITY): Payer: Self-pay | Admitting: Internal Medicine

## 2023-06-04 LAB — SURGICAL PATHOLOGY

## 2023-06-09 ENCOUNTER — Ambulatory Visit: Payer: Self-pay | Admitting: Internal Medicine

## 2023-06-09 DIAGNOSIS — Z0001 Encounter for general adult medical examination with abnormal findings: Secondary | ICD-10-CM | POA: Diagnosis not present

## 2023-06-09 DIAGNOSIS — E1122 Type 2 diabetes mellitus with diabetic chronic kidney disease: Secondary | ICD-10-CM | POA: Diagnosis not present

## 2023-06-09 DIAGNOSIS — N1831 Chronic kidney disease, stage 3a: Secondary | ICD-10-CM | POA: Diagnosis not present

## 2023-06-22 DIAGNOSIS — C001 Malignant neoplasm of external lower lip: Secondary | ICD-10-CM | POA: Diagnosis not present

## 2023-07-28 DIAGNOSIS — Z48817 Encounter for surgical aftercare following surgery on the skin and subcutaneous tissue: Secondary | ICD-10-CM | POA: Diagnosis not present

## 2023-08-30 ENCOUNTER — Ambulatory Visit: Payer: Medicare Other

## 2023-08-30 VITALS — BP 126/80 | HR 80 | Temp 97.6°F | Resp 18 | Ht 70.0 in | Wt 189.6 lb

## 2023-08-30 DIAGNOSIS — E7800 Pure hypercholesterolemia, unspecified: Secondary | ICD-10-CM

## 2023-08-30 MED ORDER — INCLISIRAN SODIUM 284 MG/1.5ML ~~LOC~~ SOSY
284.0000 mg | PREFILLED_SYRINGE | Freq: Once | SUBCUTANEOUS | Status: AC
Start: 2023-08-30 — End: 2023-08-30
  Administered 2023-08-30: 284 mg via SUBCUTANEOUS
  Filled 2023-08-30: qty 1.5

## 2023-08-30 NOTE — Progress Notes (Signed)
 Diagnosis: Hyperlipidemia  Provider:  Mannam, Praveen MD  Procedure: Injection  Leqvio  (inclisiran), Dose: 284 mg, Site: subcutaneous, Number of injections: 1  Injection Site(s): Left arm  Post Care: Patient declined observation  Discharge: Condition: Good, Destination: Home . AVS Provided  Performed by:  Leonard Feigel, RN

## 2023-09-04 ENCOUNTER — Encounter (HOSPITAL_COMMUNITY): Payer: Self-pay | Admitting: Emergency Medicine

## 2023-09-04 ENCOUNTER — Emergency Department (HOSPITAL_COMMUNITY)

## 2023-09-04 ENCOUNTER — Other Ambulatory Visit: Payer: Self-pay

## 2023-09-04 ENCOUNTER — Emergency Department (HOSPITAL_COMMUNITY)
Admission: EM | Admit: 2023-09-04 | Discharge: 2023-09-04 | Disposition: A | Discharge status: Home / Self Care | Attending: Student | Admitting: Student

## 2023-09-04 DIAGNOSIS — N1831 Chronic kidney disease, stage 3a: Secondary | ICD-10-CM | POA: Insufficient documentation

## 2023-09-04 DIAGNOSIS — Z7984 Long term (current) use of oral hypoglycemic drugs: Secondary | ICD-10-CM | POA: Insufficient documentation

## 2023-09-04 DIAGNOSIS — Z7902 Long term (current) use of antithrombotics/antiplatelets: Secondary | ICD-10-CM | POA: Insufficient documentation

## 2023-09-04 DIAGNOSIS — E1122 Type 2 diabetes mellitus with diabetic chronic kidney disease: Secondary | ICD-10-CM | POA: Diagnosis not present

## 2023-09-04 DIAGNOSIS — K5730 Diverticulosis of large intestine without perforation or abscess without bleeding: Secondary | ICD-10-CM | POA: Diagnosis not present

## 2023-09-04 DIAGNOSIS — I129 Hypertensive chronic kidney disease with stage 1 through stage 4 chronic kidney disease, or unspecified chronic kidney disease: Secondary | ICD-10-CM | POA: Insufficient documentation

## 2023-09-04 DIAGNOSIS — K449 Diaphragmatic hernia without obstruction or gangrene: Secondary | ICD-10-CM | POA: Diagnosis not present

## 2023-09-04 DIAGNOSIS — N200 Calculus of kidney: Secondary | ICD-10-CM

## 2023-09-04 DIAGNOSIS — R1032 Left lower quadrant pain: Secondary | ICD-10-CM | POA: Diagnosis not present

## 2023-09-04 DIAGNOSIS — Z79899 Other long term (current) drug therapy: Secondary | ICD-10-CM | POA: Diagnosis not present

## 2023-09-04 DIAGNOSIS — N132 Hydronephrosis with renal and ureteral calculous obstruction: Secondary | ICD-10-CM | POA: Diagnosis not present

## 2023-09-04 DIAGNOSIS — Z87891 Personal history of nicotine dependence: Secondary | ICD-10-CM | POA: Diagnosis not present

## 2023-09-04 DIAGNOSIS — N281 Cyst of kidney, acquired: Secondary | ICD-10-CM | POA: Diagnosis not present

## 2023-09-04 LAB — URINALYSIS, ROUTINE W REFLEX MICROSCOPIC
Bacteria, UA: NONE SEEN
Bilirubin Urine: NEGATIVE
Glucose, UA: 50 mg/dL — AB
Ketones, ur: NEGATIVE mg/dL
Leukocytes,Ua: NEGATIVE
Nitrite: NEGATIVE
Protein, ur: 30 mg/dL — AB
RBC / HPF: >50 RBC/hpf (ref 0–5)
Specific Gravity, Urine: >1.046 — ABNORMAL HIGH (ref 1.005–1.030)
pH: 5 (ref 5.0–8.0)

## 2023-09-04 LAB — LIPASE, BLOOD: Lipase: 41 U/L (ref 11–51)

## 2023-09-04 LAB — COMPREHENSIVE METABOLIC PANEL WITH GFR
ALT: 20 U/L (ref 0–44)
AST: 23 U/L (ref 15–41)
Albumin: 4.1 g/dL (ref 3.5–5.0)
Alkaline Phosphatase: 74 U/L (ref 38–126)
Anion gap: 13 (ref 5–15)
BUN: 26 mg/dL — ABNORMAL HIGH (ref 8–23)
CO2: 25 mmol/L (ref 22–32)
Calcium: 9.5 mg/dL (ref 8.9–10.3)
Chloride: 102 mmol/L (ref 98–111)
Creatinine, Ser: 1.49 mg/dL — ABNORMAL HIGH (ref 0.61–1.24)
GFR, Estimated: 48 mL/min — ABNORMAL LOW (ref >60)
Glucose, Bld: 211 mg/dL — ABNORMAL HIGH (ref 70–99)
Potassium: 4.8 mmol/L (ref 3.5–5.1)
Sodium: 140 mmol/L (ref 135–145)
Total Bilirubin: 1.1 mg/dL (ref 0.0–1.2)
Total Protein: 7.3 g/dL (ref 6.5–8.1)

## 2023-09-04 LAB — CBC
HCT: 48.5 % (ref 39.0–52.0)
Hemoglobin: 15.6 g/dL (ref 13.0–17.0)
MCH: 29.5 pg (ref 26.0–34.0)
MCHC: 32.2 g/dL (ref 30.0–36.0)
MCV: 91.7 fL (ref 80.0–100.0)
Platelets: 227 K/uL (ref 150–400)
RBC: 5.29 MIL/uL (ref 4.22–5.81)
RDW: 16.5 % — ABNORMAL HIGH (ref 11.5–15.5)
WBC: 15.9 K/uL — ABNORMAL HIGH (ref 4.0–10.5)
nRBC: 0 % (ref 0.0–0.2)

## 2023-09-04 MED ORDER — IOHEXOL 300 MG/ML  SOLN
100.0000 mL | Freq: Once | INTRAMUSCULAR | Status: AC | PRN
  Administered 2023-09-04: 100 mL via INTRAVENOUS

## 2023-09-04 MED ORDER — TAMSULOSIN HCL 0.4 MG PO CAPS: 0.4000 mg | ORAL_CAPSULE | Freq: Every day | ORAL | 0 refills | Status: AC

## 2023-09-04 MED ORDER — HYDROCODONE-ACETAMINOPHEN 5-325 MG PO TABS
1.0000 | ORAL_TABLET | Freq: Once | ORAL | Status: AC
  Administered 2023-09-04: 1 via ORAL
  Filled 2023-09-04: qty 1

## 2023-09-04 MED ORDER — LACTATED RINGERS IV BOLUS
1000.0000 mL | Freq: Once | INTRAVENOUS | Status: AC
  Administered 2023-09-04: 1000 mL via INTRAVENOUS

## 2023-09-04 MED ORDER — KETOROLAC TROMETHAMINE 15 MG/ML IJ SOLN
15.0000 mg | Freq: Once | INTRAMUSCULAR | Status: AC
  Administered 2023-09-04: 15 mg via INTRAVENOUS
  Filled 2023-09-04: qty 1

## 2023-09-04 MED ORDER — NAPROXEN 375 MG PO TABS: 375.0000 mg | ORAL_TABLET | Freq: Two times a day (BID) | ORAL | 0 refills | Status: DC

## 2023-09-04 MED ORDER — TAMSULOSIN HCL 0.4 MG PO CAPS
0.4000 mg | ORAL_CAPSULE | Freq: Once | ORAL | Status: AC
  Administered 2023-09-04: 0.4 mg via ORAL
  Filled 2023-09-04: qty 1

## 2023-09-04 MED ORDER — HYDROCODONE-ACETAMINOPHEN 5-325 MG PO TABS: 2.0000 | ORAL_TABLET | ORAL | 0 refills | Status: DC | PRN

## 2023-09-04 NOTE — ED Notes (Signed)
 Pre pack Norco 6 tablets given upon DC with instructions.

## 2023-09-04 NOTE — ED Triage Notes (Signed)
 Pt presents with LLQ pain and left lower back pain.  Has been intermittent but today has been constant and worsening.  Pt has had no n/v/d or shob.  No fever/chills.  Hx of kidney stones but states it normally only hurts in his back.

## 2023-09-04 NOTE — ED Notes (Signed)
 Patient transported to CT

## 2023-09-04 NOTE — ED Notes (Signed)
 ED Provider at bedside.

## 2023-09-05 NOTE — ED Provider Notes (Signed)
 [place] EMERGENCY DEPARTMENT AT [facility] Provider Note  CSN: [id number] Arrival date & time: 09/04/23 1928  Chief Complaint(s) Abdominal Pain  HPI David Pruitt is a 78 y.o. male with PMH CKD 3, previous CVA, dermatomyositis, HTN, HLD, T2DM who presents emerged part for evaluation of abdominal pain.  States that pain began abruptly this morning in the left flank and is radiating into the groin.  Pain is primarily in left lower quadrant at this time.  Denies associated nausea or vomiting.  Denies dysuria, chest pain, shortness of breath, headache, fever or other systemic symptoms.   Past Medical History Past Medical History:  Diagnosis Date   CKD (chronic kidney disease), stage IIIa (HCC) 08/15/2020   CVA (cerebral infarction)    2006   DM (dermatomyositis)    Gastritis    GERD (gastroesophageal reflux disease)    Hemorrhoids    Hypercholesteremia    Hypertension    Lower GI bleed    SECONDARY TO DIVERTICULA,S/P BLEED   Overweight (BMI 25.0-29.9) 08/15/2020   Shortness of breath dyspnea    Type 2 diabetes mellitus (HCC)    Patient Active Problem List   Diagnosis Date Noted   History of stroke 08/28/2022   History of kidney stones 08/28/2022   Family history of prostate cancer 10/09/2020   Class 1 obesity due to excess calories with body mass index (BMI) of 30.0 to 30.9 in adult    CKD (chronic kidney disease), stage IIIa (HCC) 08/15/2020   Overweight (BMI 25.0-29.9) 08/15/2020   Essential hypertension    Hypercholesteremia    Type 2 diabetes mellitus with hyperglycemia (HCC)    Diverticulitis of colon 10/25/2019   Anterior urethral stricture 06/22/2019   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 Authorizing Provider  HYDROcodone -acetaminophen  (NORCO/VICODIN) 5-325 MG tablet Take 2 tablets by mouth every 4 (four) hours as needed. 09/04/23  Yes Nica Friske, MD  naproxen  (NAPROSYN ) 375 MG tablet Take 1 tablet (375 mg total) by mouth 2 (two) times daily. 09/04/23  Yes Wakeelah Solan, MD  tamsulosin  (FLOMAX ) 0.4 MG CAPS capsule Take 1 capsule (0.4 mg total) by mouth daily for 7 days. 09/04/23 09/11/23 Yes Erwin Nishiyama, MD  clopidogrel  (PLAVIX ) 75 MG tablet Take 1 tablet (75 mg total) by mouth daily. 08/19/20   Ricky Fines, MD  Coenzyme Q10-Fish Oil-Vit E (CO-Q 10 OMEGA-3 FISH OIL) CAPS Take 1 capsule by mouth daily.    [provider]  gabapentin  (NEURONTIN ) 100 MG capsule Take 100 mg by mouth 3 (three) times daily. 10/22/21   [provider]  glipiZIDE (GLUCOTROL XL) 5 MG 24 hr tablet Take 5 mg by mouth daily.    [provider]  glucosamine-chondroitin 500-400 MG tablet Take 1 tablet by mouth daily.    [provider]  metFORMIN (GLUCOPHAGE-XR) 500 MG 24 hr tablet Take 500 mg by mouth daily. 10/28/21   [provider]  metoprolol  succinate (TOPROL -XL) 50 MG 24 hr tablet Take 50 mg by mouth daily. 10/23/19   [provider]  Multiple Vitamins-Minerals (MULTIVITAMIN WITH MINERALS) tablet Take 1 tablet by  mouth daily.    [provider]  pantoprazole  (PROTONIX ) 40 MG tablet Take 1 tablet (40 mg total) by mouth daily. Patient taking differently: Take 40 mg by mouth daily as needed (reflux). 04/29/21   Rourk, Lamar HERO, MD  Semaglutide , 2 MG/DOSE, (OZEMPIC , 2 MG/DOSE,) 8 MG/3ML SOPN Inject 2 mg into the skin once a week. 10/02/21     Turmeric Curcumin 500 MG CAPS Take 1 capsule by mouth daily.    [provider]  vitamin B-12 (CYANOCOBALAMIN ) 1000 MCG tablet Take 1,000 mcg daily  by mouth.    [provider]                                                                                                                                    Past Surgical History Past Surgical History:  Procedure Laterality Date   COLONOSCOPY  12/2009   RMR: Lower GI bleed due to a single diverticulum status post Endo clipping.  Scattered diverticulosis.   COLONOSCOPY N/A 05/24/2014   Procedure: COLONOSCOPY;  Surgeon: Claudis RAYMOND Rivet, MD;  Location: [facility];  Service: Endoscopy;  Laterality: N/A;  1030   COLONOSCOPY N/A 03/15/2019   Diverticulosis, 5 polyps removed.  Pathology revealed tubular adenomas.  Next colonoscopy in 3 years.   COLONOSCOPY N/A 06/03/2023   Procedure: COLONOSCOPY;  Surgeon: Shaaron Lamar HERO, MD;  [facility]: [facility];  Service: Endoscopy;  Laterality: N/A;  7:30 am, asa 3, ok room 1/2 per Mindy   COLONOSCOPY WITH PROPOFOL  N/A 08/16/2020   Procedure: COLONOSCOPY WITH PROPOFOL ;  Surgeon: Rivet Claudis RAYMOND, MD;  [facility]: [facility];  Service: Endoscopy;  Laterality: N/A;   ESOPHAGOGASTRODUODENOSCOPY  12/2009   RMR: Mild esophagitis, gastritis, duodenitis.  Gastric biopsies negative for H. pylori   ESOPHAGOGASTRODUODENOSCOPY (EGD) WITH PROPOFOL  N/A 08/15/2020   Procedure: ESOPHAGOGASTRODUODENOSCOPY (EGD) WITH PROPOFOL ;  Surgeon: Shaaron Lamar HERO, MD;  [facility]: [facility];  Service: Endoscopy;  Laterality: N/A;  covid + (asymptomatic)   GIVENS CAPSULE STUDY N/A 08/29/2020   Procedure: GIVENS CAPSULE STUDY;  Surgeon: Rivet Claudis RAYMOND, MD;  [facility]: [facility];  Service: Endoscopy;  Laterality: N/A;   POLYPECTOMY  03/15/2019   Procedure: POLYPECTOMY;  Surgeon: Shaaron Lamar HERO, MD;  [facility];  Service: Endoscopy;;   Family History Family History  Problem Relation Age of Onset   Colon cancer Father    Stroke Father     Social History Social History   Tobacco Use   Smoking status: Former    Current packs/day: 1.00    Average  packs/day: 1 pack/day for 40.0 years (40.0 ttl pk-yrs)    Types: Cigarettes   Smokeless tobacco: Never  Vaping Use   Vaping status: Never Used  Substance Use Topics   Alcohol use: Yes    Comment: occasionally   Drug use: No   Allergies Dapagliflozin and Statins  Review of Systems Review of  Systems  Gastrointestinal:  Positive for abdominal pain.  Genitourinary:  Positive for flank pain.    Physical Exam Vital Signs  I have reviewed the triage vital signs BP (!) 126/98 (BP Location: Right Arm)   Pulse 90   Temp 98 F (36.7 C) (Oral)   Resp 17   SpO2 98%   Physical Exam Constitutional:      General: He is not in acute distress.    Appearance: Normal appearance.  HENT:     Head: Normocephalic and atraumatic.     Nose: No congestion or rhinorrhea.  Eyes:     General:        Right eye: No discharge.        Left eye: No discharge.     Extraocular Movements: Extraocular movements intact.     Pupils: Pupils are equal, round, and reactive to light.  Cardiovascular:     Rate and Rhythm: Normal rate and regular rhythm.     Heart sounds: No murmur heard. Pulmonary:     Effort: No respiratory distress.     Breath sounds: No wheezing or rales.  Abdominal:     General: There is no distension.     Tenderness: There is abdominal tenderness in the left lower quadrant.  Musculoskeletal:        General: Normal range of motion.     Cervical back: Normal range of motion.  Skin:    General: Skin is warm and dry.  Neurological:     General: No focal deficit present.     Mental Status: He is alert.     ED Results and Treatments Labs (all labs ordered are listed, but only abnormal results are displayed) Labs Reviewed  COMPREHENSIVE METABOLIC PANEL WITH GFR - Abnormal; Notable for the following components:      Result Value   Glucose, Bld 211 (*)    BUN 26 (*)    Creatinine, Ser 1.49 (*)    GFR, Estimated 48 (*)    All other components within normal limits  CBC -  Abnormal; Notable for the following components:   WBC 15.9 (*)    RDW 16.5 (*)    All other components within normal limits  URINALYSIS, ROUTINE W REFLEX MICROSCOPIC - Abnormal; Notable for the following components:   APPearance HAZY (*)    Specific Gravity, Urine >1.046 (*)    Glucose, UA 50 (*)    Hgb urine dipstick LARGE (*)    Protein, ur 30 (*)    All other components within normal limits  LIPASE, BLOOD                                                                                                                          Radiology CT ABDOMEN PELVIS W CONTRAST Result Date: 09/04/2023 CLINICAL DATA:  LLQ abdominal pain. LLQ pain and left lower back pain. Has been intermittent but today has been constant and worsening. Pt has had no n/v/d or shob. No  fever/chills. Hx of kidney stones but states it normally only hurts in his back. EXAM: CT ABDOMEN AND PELVIS WITH CONTRAST TECHNIQUE: Multidetector CT imaging of the abdomen and pelvis was performed using the standard protocol following bolus administration of intravenous contrast. RADIATION DOSE REDUCTION: This exam was performed according to the departmental dose-optimization program which includes automated exposure control, adjustment of the mA and/or kV according to patient size and/or use of iterative reconstruction technique. CONTRAST:  OMNIPAQUE  IOHEXOL  300 MG/ML  SOLN COMPARISON:  CT abdomen pelvis 12/02/2018, MRI abdomen 07/01/2022 FINDINGS: Lower chest: Stable chronic subpleural left lower lobe 1.3 x 0.9 cm-no further follow-up indicated. Small hiatal hernia. Hepatobiliary: The hepatic parenchyma is diffusely hypodense compared to the splenic parenchyma consistent with fatty infiltration. No focal liver abnormality. No gallstones, gallbladder wall thickening, or pericholecystic fluid. No biliary dilatation. Pancreas: Stable fluid dense lesions of the pancreas measuring up to 2 cm are again noted and better evaluated on MRI abdomen  07/01/2022. Normal pancreatic contour. No surrounding inflammatory changes. No main pancreatic ductal dilatation. Spleen: Normal in size without focal abnormality. Adrenals/Urinary Tract: No adrenal nodule bilaterally. Asymmetric right perinephric fat stranding. Slightly delayed right nephrogram. Fluid density lesion likely represents a simple renal cyst. Simple renal cysts, in the absence of clinically indicated signs/symptoms, require no independent follow-up. Renal cortical scarring. 3 mm calcified stone within the proximal left ureter. Associated mild proximal hydroureteronephrosis. No right hydroureter. No right nephroureterolithiasis. The urinary bladder is unremarkable. On delayed imaging, there is no urothelial wall thickening and there are no filling defects in the opacified portions of the right collecting system and ureter. No excretion of intravenous contrast on the left. Stomach/Bowel: Stomach is within normal limits. No evidence of bowel wall thickening or dilatation. Colonic diverticulosis. Appendix appears normal. Vascular/Lymphatic: No abdominal aorta or iliac aneurysm. Severe atherosclerotic plaque of the aorta and its branches. No abdominal, pelvic, or inguinal lymphadenopathy. Reproductive: Prostate is unremarkable. Other: No intraperitoneal free fluid. No intraperitoneal free gas. No organized fluid collection. Musculoskeletal: No abdominal wall hernia or abnormality. No suspicious lytic or blastic osseous lesions. No acute displaced fracture. Multilevel degenerative changes of the spine. Mild retrolisthesis of L3 on L4 and L4 on L5. IMPRESSION: 1. Obstructive 3 mm proximal left ureterolithiasis. 2. Small hiatal hernia. 3. Hepatic steatosis. 4. Colonic diverticulosis with no acute diverticulitis. 5. Stable fluid dense lesions of the pancreas measuring up to 2 cm are again noted and better evaluated on MRI abdomen 07/01/2022. 6.  Aortic Atherosclerosis (ICD10-I70.0). Electronically Signed   By:  Morgane  Naveau M.D.   On: 09/04/2023 20:58    Pertinent labs & imaging results that were available during my care of the patient were reviewed by me and considered in my medical decision making (see MDM for details).  Medications Ordered in ED Medications  ketorolac  (TORADOL ) 15 MG/ML injection 15 mg (15 mg Intravenous Given 09/04/23 2009)  iohexol  (OMNIPAQUE ) 300 MG/ML solution 100 mL (100 mLs Intravenous Contrast Given 09/04/23 2034)  lactated ringers  bolus 1,000 mL (0 mLs Intravenous Stopped 09/04/23 2305)  tamsulosin  (FLOMAX ) capsule 0.4 mg (0.4 mg Oral Given 09/04/23 2130)  HYDROcodone -acetaminophen  (NORCO/VICODIN) 5-325 MG per tablet 1 tablet (1 tablet Oral Given 09/04/23 2206)  Procedures Procedures  (including critical care time)  Medical Decision Making / ED Course   This patient presents to the ED for concern of abdominal pain, this involves an extensive number of treatment options, and is a complaint that carries with it a high risk of complications and morbidity.  The differential diagnosis includes diverticulitis, epiploic appendagitis, colitis, gastroenteritis, constipation, nephrolithiasis, inflammatory bowel disease,   MDM: Patient seen emerged from for evaluation of abdominal pain.  Physical exam with tenderness in the left lower quadrant but is otherwise unremarkable.  Laboratory evaluation with a leukocytosis to 15.9, BUN 26, creatinine 1.49 which is a mild elevation for this patient.  Urinalysis reassuringly negative for infection.  CT abdomen pelvis with an obstructive 3 mm stone in the proximal left ureter.  Patient pain controlled Emergency Department and fluid resuscitated.  Started on Flomax  and he will need outpatient urology follow-up to ensure stone passage.  No evidence of septic stone in the ER today.  At this time he does not meet  inpatient criteria for admission and will be discharged with outpatient urology follow-up.  I placed an outpatient urology referral.   Additional history obtained: -Additional history obtained from spouse -External records from outside source obtained and reviewed including: Chart review including previous notes, labs, imaging, consultation notes   Lab Tests: -I ordered, reviewed, and interpreted labs.   The pertinent results include:   Labs Reviewed  COMPREHENSIVE METABOLIC PANEL WITH GFR - Abnormal; Notable for the following components:      Result Value   Glucose, Bld 211 (*)    BUN 26 (*)    Creatinine, Ser 1.49 (*)    GFR, Estimated 48 (*)    All other components within normal limits  CBC - Abnormal; Notable for the following components:   WBC 15.9 (*)    RDW 16.5 (*)    All other components within normal limits  URINALYSIS, ROUTINE W REFLEX MICROSCOPIC - Abnormal; Notable for the following components:   APPearance HAZY (*)    Specific Gravity, Urine >1.046 (*)    Glucose, UA 50 (*)    Hgb urine dipstick LARGE (*)    Protein, ur 30 (*)    All other components within normal limits  LIPASE, BLOOD      Imaging Studies ordered: I ordered imaging studies including CT abdomen pelvis I independently visualized and interpreted imaging. I agree with the radiologist interpretation   Medicines ordered and prescription drug management: Meds ordered this encounter  Medications   ketorolac  (TORADOL ) 15 MG/ML injection 15 mg   iohexol  (OMNIPAQUE ) 300 MG/ML solution 100 mL   lactated ringers  bolus 1,000 mL   tamsulosin  (FLOMAX ) capsule 0.4 mg   tamsulosin  (FLOMAX ) 0.4 MG CAPS capsule    Sig: Take 1 capsule (0.4 mg total) by mouth daily for 7 days.    Dispense:  7 capsule    Refill:  0   naproxen  (NAPROSYN ) 375 MG tablet    Sig: Take 1 tablet (375 mg total) by mouth 2 (two) times daily.    Dispense:  20 tablet    Refill:  0   HYDROcodone -acetaminophen  (NORCO/VICODIN) 5-325  MG per tablet 1 tablet    Refill:  0   HYDROcodone -acetaminophen  (NORCO/VICODIN) 5-325 MG tablet    Sig: Take 2 tablets by mouth every 4 (four) hours as needed.    Dispense:  6 tablet    Refill:  0    -I have reviewed the patients home medicines and have made adjustments as  needed  Critical interventions none    Cardiac Monitoring: The patient was maintained on a cardiac monitor.  I personally viewed and interpreted the cardiac monitored which showed an underlying rhythm of: NSR  Social Determinants of Health:  Factors impacting patients care include: none   Reevaluation: After the interventions noted above, I reevaluated the patient and found that they have :improved  Co morbidities that complicate the patient evaluation  Past Medical History:  Diagnosis Date   CKD (chronic kidney disease), stage IIIa (HCC) 08/15/2020   CVA (cerebral infarction)    2006   DM (dermatomyositis)    Gastritis    GERD (gastroesophageal reflux disease)    Hemorrhoids    Hypercholesteremia    Hypertension    Lower GI bleed    SECONDARY TO DIVERTICULA,S/P BLEED   Overweight (BMI 25.0-29.9) 08/15/2020   Shortness of breath dyspnea    Type 2 diabetes mellitus (HCC)       Dispostion: I considered admission for this patient, but this time he does not meet inpatient criteria for admission and will be discharged outpatient follow-up     Final Clinical Impression(s) / ED Diagnoses Final diagnoses:  Nephrolithiasis     @PCDICTATION @    Albertina Dixon, MD 09/05/23 1119

## 2023-09-06 ENCOUNTER — Other Ambulatory Visit: Payer: Self-pay

## 2023-09-06 DIAGNOSIS — N200 Calculus of kidney: Secondary | ICD-10-CM

## 2023-09-06 MED FILL — Hydrocodone-Acetaminophen Tab 5-325 MG: ORAL | Qty: 6 | Status: AC

## 2023-09-08 ENCOUNTER — Ambulatory Visit (HOSPITAL_COMMUNITY)
Admission: RE | Admit: 2023-09-08 | Discharge: 2023-09-08 | Disposition: A | Discharge status: Home / Self Care | Source: Ambulatory Visit | Attending: Urology | Admitting: Urology

## 2023-09-08 ENCOUNTER — Ambulatory Visit: Admitting: Urology

## 2023-09-08 ENCOUNTER — Encounter: Payer: Self-pay | Admitting: Urology

## 2023-09-08 VITALS — BP 114/66 | HR 76

## 2023-09-08 DIAGNOSIS — N200 Calculus of kidney: Secondary | ICD-10-CM

## 2023-09-08 DIAGNOSIS — N201 Calculus of ureter: Secondary | ICD-10-CM | POA: Diagnosis not present

## 2023-09-08 LAB — URINALYSIS, ROUTINE W REFLEX MICROSCOPIC
Bilirubin, UA: NEGATIVE
Glucose, UA: NEGATIVE
Ketones, UA: NEGATIVE
Leukocytes,UA: NEGATIVE
Nitrite, UA: NEGATIVE
RBC, UA: NEGATIVE
Specific Gravity, UA: 1.03 (ref 1.005–1.030)
Urobilinogen, Ur: 0.2 mg/dL (ref 0.2–1.0)
pH, UA: 6 (ref 5.0–7.5)

## 2023-09-08 LAB — MICROSCOPIC EXAMINATION: Bacteria, UA: NONE SEEN

## 2023-09-08 NOTE — Progress Notes (Signed)
 09/08/2023 11:31 AM   David Pruitt Jan 08, 1946 [id number]  Referring provider: Marvine Rush, MD [address] [place],  [place] [address]  nephrolithiasis  HPI: Mr David Pruitt is a 78yo here for evaluation of nephrolithiasis. He has had numerous stone events in the past with his first event in the 1970s. He developed left flank pain 8/23 and he presented to the ER and was diagnosed with a 3mm left proximal ureteral calculus. He believes he passed the calculus yesterday. KUb from today does not show a stone. He denies any flank pain currently. He has lost 25lbs over the past 18 months. No worsening LUTS. He drinks 3 cups of coffee daily and 10oz of water  daily.    PMH: Past Medical History:  Diagnosis Date   CKD (chronic kidney disease), stage IIIa (HCC) 08/15/2020   CVA (cerebral infarction)    2006   DM (dermatomyositis)    Gastritis    GERD (gastroesophageal reflux disease)    Hemorrhoids    Hypercholesteremia    Hypertension    Lower GI bleed    SECONDARY TO DIVERTICULA,S/P BLEED   Overweight (BMI 25.0-29.9) 08/15/2020   Shortness of breath dyspnea    Type 2 diabetes mellitus (HCC)     Surgical History: Past Surgical History:  Procedure Laterality Date   COLONOSCOPY  12/2009   RMR: Lower GI bleed due to a single diverticulum status post Endo clipping.  Scattered diverticulosis.   COLONOSCOPY N/A 05/24/2014   Procedure: COLONOSCOPY;  Surgeon: Claudis RAYMOND Rivet, MD;  [facility];  Service: Endoscopy;  Laterality: N/A;  1030   COLONOSCOPY N/A 03/15/2019   Diverticulosis, 5 polyps removed.  Pathology revealed tubular adenomas.  Next colonoscopy in 3 years.   COLONOSCOPY N/A 06/03/2023   Procedure: COLONOSCOPY;  Surgeon: Shaaron Lamar HERO, MD;  [facility];  Service: Endoscopy;  Laterality: N/A;  7:30 am, asa 3, ok room 1/2 per Mindy   COLONOSCOPY WITH PROPOFOL  N/A 08/16/2020   Procedure: COLONOSCOPY WITH PROPOFOL ;  Surgeon: Rivet Claudis RAYMOND, MD;   [facility];  Service: Endoscopy;  Laterality: N/A;   ESOPHAGOGASTRODUODENOSCOPY  12/2009   RMR: Mild esophagitis, gastritis, duodenitis.  Gastric biopsies negative for H. pylori   ESOPHAGOGASTRODUODENOSCOPY (EGD) WITH PROPOFOL  N/A 08/15/2020   Procedure: ESOPHAGOGASTRODUODENOSCOPY (EGD) WITH PROPOFOL ;  Surgeon: Shaaron Lamar HERO, MD;  [facility];  Service: Endoscopy;  Laterality: N/A;  covid + (asymptomatic)   GIVENS CAPSULE STUDY N/A 08/29/2020   Procedure: GIVENS CAPSULE STUDY;  Surgeon: Rivet Claudis RAYMOND, MD;  [facility];  Service: Endoscopy;  Laterality: N/A;   POLYPECTOMY  03/15/2019   Procedure: POLYPECTOMY;  Surgeon: Shaaron Lamar HERO, MD;  [facility];  Service: Endoscopy;;    Home Medications:  Allergies as of 09/08/2023       Reactions   Dapagliflozin    Other reaction(s): Yeast infection   Statins    Other reaction(s): Unknown        Medication List        Accurate as of September 08, 2023 11:31 AM. If you have any questions, ask your nurse or doctor.          clopidogrel  75 MG tablet Commonly known as: PLAVIX  Take 1 tablet (75 mg total) by mouth daily.   CO-Q 10 Omega-3 Fish Oil Caps Take 1 capsule by mouth daily.   cyanocobalamin  1000 MCG tablet Commonly known as: VITAMIN B12 Take 1,000 mcg daily by mouth.  gabapentin  100 MG capsule Commonly known as: NEURONTIN  Take 100 mg by mouth 3 (three) times daily.   glipiZIDE 5 MG 24 hr tablet Commonly known as: GLUCOTROL XL Take 5 mg by mouth daily.   glucosamine-chondroitin 500-400 MG tablet Take 1 tablet by mouth daily.   HYDROcodone -acetaminophen  5-325 MG tablet Commonly known as: NORCO/VICODIN Take 2 tablets by mouth every 4 (four) hours as needed.   metFORMIN 500 MG 24 hr tablet Commonly known as: GLUCOPHAGE-XR Take 500 mg by mouth daily.   metoprolol  succinate 50 MG 24 hr tablet Commonly known as: TOPROL -XL Take 50 mg by mouth daily.   multivitamin with  minerals tablet Take 1 tablet by mouth daily.   naproxen  375 MG tablet Commonly known as: NAPROSYN  Take 1 tablet (375 mg total) by mouth 2 (two) times daily.   Ozempic  (2 MG/DOSE) 8 MG/3ML Sopn Generic drug: Semaglutide  (2 MG/DOSE) Inject 2 mg into the skin once a week.   pantoprazole  40 MG tablet Commonly known as: PROTONIX  Take 1 tablet (40 mg total) by mouth daily. What changed:  when to take this reasons to take this   tamsulosin  0.4 MG Caps capsule Commonly known as: FLOMAX  Take 1 capsule (0.4 mg total) by mouth daily for 7 days.   Turmeric Curcumin 500 MG Caps Take 1 capsule by mouth daily.        Allergies:  Allergies  Allergen Reactions   Dapagliflozin     Other reaction(s): Yeast infection   Statins     Other reaction(s): Unknown    Family History: Family History  Problem Relation Age of Onset   Colon cancer Father    Stroke Father     Social History:  reports that he has quit smoking. His smoking use included cigarettes. He has a 40 pack-year smoking history. He has never used smokeless tobacco. He reports current alcohol use. He reports that he does not use drugs.  ROS: All other review of systems were reviewed and are negative except what is noted above in HPI  Physical Exam: BP 114/66   Pulse 76   Constitutional:  Alert and oriented, No acute distress. HEENT:  AT, moist mucus membranes.  Trachea midline, no masses. Cardiovascular: No clubbing, cyanosis, or edema. Respiratory: Normal respiratory effort, no increased work of breathing. GI: Abdomen is soft, nontender, nondistended, no abdominal masses GU: No CVA tenderness.  Lymph: No cervical or inguinal lymphadenopathy. Skin: No rashes, bruises or suspicious lesions. Neurologic: Grossly intact, no focal deficits, moving all 4 extremities. Psychiatric: Normal mood and affect.  Laboratory Data: Lab Results  Component Value Date   WBC 15.9 (H) 09/04/2023   HGB 15.6 09/04/2023   HCT 48.5  09/04/2023   MCV 91.7 09/04/2023   PLT 227 09/04/2023    Lab Results  Component Value Date   CREATININE 1.49 (H) 09/04/2023    No results found for: PSA  No results found for: TESTOSTERONE  Lab Results  Component Value Date   HGBA1C 7.1 (H) 07/28/2022    Urinalysis    Component Value Date/Time   COLORURINE YELLOW 09/04/2023 2130   APPEARANCEUR HAZY (A) 09/04/2023 2130   APPEARANCEUR Clear 10/09/2020 1438   LABSPEC >1.046 (H) 09/04/2023 2130   PHURINE 5.0 09/04/2023 2130   GLUCOSEU 50 (A) 09/04/2023 2130   HGBUR LARGE (A) 09/04/2023 2130   BILIRUBINUR NEGATIVE 09/04/2023 2130   BILIRUBINUR Negative 10/09/2020 1438   KETONESUR NEGATIVE 09/04/2023 2130   PROTEINUR 30 (A) 09/04/2023 2130   NITRITE NEGATIVE 09/04/2023  2130   LEUKOCYTESUR NEGATIVE 09/04/2023 2130    Lab Results  Component Value Date   LABMICR Comment 10/09/2020   BACTERIA NONE SEEN 09/04/2023    Pertinent Imaging: Ct 8/23 and KUb today: Images reviewed and discussed with the patient  Results for orders placed during the hospital encounter of 08/15/10  DG Abd 1 View  Narrative *RADIOLOGY REPORT*  Clinical Data: Question foreign body.  ABDOMEN - 1 VIEW  Comparison: [facility] portable chest x-ray 12/20/2009.  Findings: No metallic surgical clip or foreign body seen within visualized abdomen/pelvis.  Normal bowel gas pattern with slight to moderate retained colonic feces, degenerative disc vertebral changes lumbar spine.  IMPRESSION:  1.  No metallic surgical clip or foreign body visualized. 2.  No acute findings.  Original Report Authenticated By: SEABRON CHARLENA ELLEN, M.D.  No results found for this or any previous visit.  No results found for this or any previous visit.  No results found for this or any previous visit.  Results for orders placed during the hospital encounter of 01/12/22  US  RENAL  Narrative CLINICAL DATA:  Left renal mass  EXAM: RENAL / URINARY  TRACT ULTRASOUND COMPLETE  COMPARISON:  CT 12/02/2018.  FINDINGS: Right Kidney:  Renal measurements: 11.4 x 6.7 x 5.3 cm = volume: 214.8 mL. Echogenicity within normal limits. No mass or hydronephrosis visualized.  Left Kidney:  Renal measurements: 11.0 x 6.0 x 5.2 cm = volume: 180.0 mL. No hydronephrosis. Echogenicity within normal limits. Cystic lesion in the left upper pole measuring 1.8 x 1.5 x 2.0 cm, appears simple. No other renal mass identified on limited views (prior CT noted 2 lesions).  Bladder:  Appears normal for degree of bladder distention.  Other:  None.  IMPRESSION: Cystic lesion in the left upper pole measures 1.8 x 1.5 x 2.0 cm, appears to be a simple cyst on limited views. No other renal mass identified (prior CT noted 2 lesions). Non-emergent contrast enhanced renal protocol CT or MRI as an outpatient is recommended for further evaluation.   Electronically Signed By: Jacob  Kahn M.D. On: 01/14/2022 09:34  No results found for this or any previous visit.  No results found for this or any previous visit.  No results found for this or any previous visit.   Assessment & Plan:    1. Kidney stones (Primary) -Dietary handout given -followup 6 months with a KUB. - Urinalysis, Routine w reflex microscopic   No follow-ups on file.  Belvie Clara, MD  [facility] [place]

## 2023-09-08 NOTE — Patient Instructions (Signed)

## 2023-09-22 ENCOUNTER — Encounter (INDEPENDENT_AMBULATORY_CARE_PROVIDER_SITE_OTHER): Payer: Self-pay | Admitting: *Deleted

## 2023-09-22 DIAGNOSIS — E7800 Pure hypercholesterolemia, unspecified: Secondary | ICD-10-CM | POA: Diagnosis not present

## 2023-09-22 DIAGNOSIS — E1165 Type 2 diabetes mellitus with hyperglycemia: Secondary | ICD-10-CM | POA: Diagnosis not present

## 2023-09-29 DIAGNOSIS — E7800 Pure hypercholesterolemia, unspecified: Secondary | ICD-10-CM | POA: Diagnosis not present

## 2023-09-29 DIAGNOSIS — E1165 Type 2 diabetes mellitus with hyperglycemia: Secondary | ICD-10-CM | POA: Diagnosis not present

## 2023-09-29 DIAGNOSIS — I10 Essential (primary) hypertension: Secondary | ICD-10-CM | POA: Diagnosis not present

## 2023-09-29 DIAGNOSIS — Z789 Other specified health status: Secondary | ICD-10-CM | POA: Diagnosis not present

## 2023-09-29 DIAGNOSIS — Z23 Encounter for immunization: Secondary | ICD-10-CM | POA: Diagnosis not present

## 2023-09-29 DIAGNOSIS — E0840 Diabetes mellitus due to underlying condition with diabetic neuropathy, unspecified: Secondary | ICD-10-CM | POA: Diagnosis not present

## 2023-09-29 DIAGNOSIS — E7841 Elevated Lipoprotein(a): Secondary | ICD-10-CM | POA: Diagnosis not present

## 2023-10-07 ENCOUNTER — Encounter: Payer: Self-pay | Admitting: Pulmonary Disease

## 2024-01-14 ENCOUNTER — Encounter: Payer: Self-pay | Admitting: Pulmonary Disease

## 2024-01-17 ENCOUNTER — Other Ambulatory Visit (HOSPITAL_COMMUNITY): Payer: Self-pay | Admitting: Pharmacist

## 2024-01-17 ENCOUNTER — Encounter: Payer: Self-pay | Admitting: Internal Medicine

## 2024-01-17 ENCOUNTER — Encounter: Payer: Self-pay | Admitting: Endocrinology

## 2024-01-17 ENCOUNTER — Ambulatory Visit: Payer: PRIVATE HEALTH INSURANCE | Admitting: Internal Medicine

## 2024-01-17 VITALS — BP 108/72 | HR 82 | Temp 98.1°F | Ht 70.0 in | Wt 185.6 lb

## 2024-01-17 DIAGNOSIS — K219 Gastro-esophageal reflux disease without esophagitis: Secondary | ICD-10-CM

## 2024-01-17 DIAGNOSIS — I639 Cerebral infarction, unspecified: Secondary | ICD-10-CM | POA: Insufficient documentation

## 2024-01-17 DIAGNOSIS — R194 Change in bowel habit: Secondary | ICD-10-CM | POA: Diagnosis not present

## 2024-01-17 DIAGNOSIS — Z860100 Personal history of colon polyps, unspecified: Secondary | ICD-10-CM

## 2024-01-17 DIAGNOSIS — R198 Other specified symptoms and signs involving the digestive system and abdomen: Secondary | ICD-10-CM

## 2024-01-17 MED ORDER — DICYCLOMINE HCL 10 MG PO CAPS: 10.0000 mg | ORAL_CAPSULE | Freq: Every day | ORAL | 3 refills | Status: AC

## 2024-01-17 NOTE — Patient Instructions (Addendum)
 It was nice to see you again today!  I am suspicious that metformin has contributed to your bowel symptoms chronically for a while.  Ozempic  is likely compensated for the loose bowels caused by metformin.  I would like you to ask your endocrinologist to stop the metformin for 3 months as a therapeutic trial  For now we will also add dicyclomine  or Bentyl  10 mg tablet each morning to your regimen to decrease episodes of urgency (dispense 30 with 3 refills)  Continue Protonix  40 mg daily for reflux  Repeat colonoscopy in 3 years depending on overall health.  Will plan to see you back in the office in 3 months

## 2024-01-17 NOTE — Progress Notes (Signed)
 "   Gastroenterology Progress Note    Primary Care Physician:  Marvine Rush, MD Primary Gastroenterologist:  Dr. Shaaron  Pre-Procedure History & Physical: HPI:  David Pruitt is a 79 y.o. male here for follow-up longstanding altered bowel function.  Historically, with long history of postprandial diarrhea his longstanding diabetes history of chronic metformin use Ozempic  added to his regimen a couple of years ago states diarrhea actually slowed down but he still has episodes of urgency and what sounds like hyperdefecation but not diarrhea multiple episodes urgency may or may not lead to a bowel movement after his first bowel movement of the day.  Recent colonoscopy reassuring for diverticulosis and polyps for which he was invited back for repeat colonoscopy in 3 years.  GERD well-controlled on Protonix  40 mg daily  Past Medical History:  Diagnosis Date   CKD (chronic kidney disease), stage IIIa (HCC) 08/15/2020   CVA (cerebral infarction)    2006   DM (dermatomyositis)    Gastritis    GERD (gastroesophageal reflux disease)    Hemorrhoids    Hypercholesteremia    Hypertension    Lower GI bleed    SECONDARY TO DIVERTICULA,S/P BLEED   Overweight (BMI 25.0-29.9) 08/15/2020   Shortness of breath dyspnea    Type 2 diabetes mellitus (HCC)     Past Surgical History:  Procedure Laterality Date   COLONOSCOPY  12/2009   RMR: Lower GI bleed due to a single diverticulum status post Endo clipping.  Scattered diverticulosis.   COLONOSCOPY N/A 05/24/2014   Procedure: COLONOSCOPY;  Surgeon: Claudis RAYMOND Rivet, MD;  [facility];  Service: Endoscopy;  Laterality: N/A;  1030   COLONOSCOPY N/A 03/15/2019   Diverticulosis, 5 polyps removed.  Pathology revealed tubular adenomas.  Next colonoscopy in 3 years.   COLONOSCOPY N/A 06/03/2023   Procedure: COLONOSCOPY;  Surgeon: Shaaron Lamar HERO, MD;  [facility];  Service: Endoscopy;  Laterality: N/A;  7:30 am, asa 3, ok room 1/2 per  Mindy   COLONOSCOPY WITH PROPOFOL  N/A 08/16/2020   Procedure: COLONOSCOPY WITH PROPOFOL ;  Surgeon: Rivet Claudis RAYMOND, MD;  [facility];  Service: Endoscopy;  Laterality: N/A;   ESOPHAGOGASTRODUODENOSCOPY  12/2009   RMR: Mild esophagitis, gastritis, duodenitis.  Gastric biopsies negative for H. pylori   ESOPHAGOGASTRODUODENOSCOPY (EGD) WITH PROPOFOL  N/A 08/15/2020   Procedure: ESOPHAGOGASTRODUODENOSCOPY (EGD) WITH PROPOFOL ;  Surgeon: Shaaron Lamar HERO, MD;  [facility];  Service: Endoscopy;  Laterality: N/A;  covid + (asymptomatic)   GIVENS CAPSULE STUDY N/A 08/29/2020   Procedure: GIVENS CAPSULE STUDY;  Surgeon: Rivet Claudis RAYMOND, MD;  [facility];  Service: Endoscopy;  Laterality: N/A;   POLYPECTOMY  03/15/2019   Procedure: POLYPECTOMY;  Surgeon: Shaaron Lamar HERO, MD;  [facility];  Service: Endoscopy;;    Prior to Admission medications  Medication Sig Start Date End Date Taking? Authorizing Provider  ACCU-CHEK GUIDE TEST test strip 1 each by Other route 2 (two) times daily. 12/07/23  Yes [provider]  clopidogrel  (PLAVIX ) 75 MG tablet Take 1 tablet (75 mg total) by mouth daily. 08/19/20  Yes Ricky Fines, MD  Coenzyme Q10-Fish Oil-Vit E (CO-Q 10 OMEGA-3 FISH OIL) CAPS Take 1 capsule by mouth daily.   Yes [provider]  gabapentin  (NEURONTIN ) 100 MG capsule Take 100 mg by mouth 3 (three) times daily. 10/22/21  Yes [provider]  glipiZIDE (GLUCOTROL XL) 5 MG 24 hr tablet Take 5 mg by mouth daily.  Yes [provider]  glucosamine-chondroitin 500-400 MG tablet Take 1 tablet by mouth daily.   Yes [provider]  metFORMIN (GLUCOPHAGE-XR) 500 MG 24 hr tablet Take 500 mg by mouth daily. 10/28/21  Yes [provider]  metoprolol  succinate (TOPROL -XL) 50 MG 24 hr tablet Take 50 mg by mouth daily. 10/23/19  Yes [provider]  Multiple Vitamins-Minerals (MULTIVITAMIN WITH MINERALS) tablet  Take 1 tablet by mouth daily.   Yes [provider]  pantoprazole  (PROTONIX ) 40 MG tablet Take 1 tablet (40 mg total) by mouth daily. Patient taking differently: Take 40 mg by mouth daily as needed (reflux). 04/29/21  Yes Fredrica Capano, Lamar HERO, MD  Semaglutide , 2 MG/DOSE, (OZEMPIC , 2 MG/DOSE,) 8 MG/3ML SOPN Inject 2 mg into the skin once a week. 10/02/21  Yes   Turmeric Curcumin 500 MG CAPS Take 1 capsule by mouth daily.   Yes [provider]  vitamin B-12 (CYANOCOBALAMIN ) 1000 MCG tablet Take 1,000 mcg daily by mouth.   Yes [provider]    Allergies as of 01/17/2024 - Review Complete 01/17/2024  Allergen Reaction Noted   Dapagliflozin  10/31/2021   Statins  10/31/2021    Family History  Problem Relation Age of Onset   Colon cancer Father    Stroke Father     Social History   Socioeconomic History   Marital status: Married    Spouse name: Not on file   Number of children: Not on file   Years of education: Not on file   Highest education level: Not on file  Occupational History   Not on file  Tobacco Use   Smoking status: Former    Current packs/day: 1.00    Average packs/day: 1 pack/day for 40.0 years (40.0 ttl pk-yrs)    Types: Cigarettes   Smokeless tobacco: Never  Vaping Use   Vaping status: Never Used  Substance and Sexual Activity   Alcohol use: Yes    Comment: occasionally   Drug use: No   Sexual activity: Yes    Partners: Female    Comment: spouse  Other Topics Concern   Not on file  Social History Narrative   Not on file   Social Drivers of Health   Tobacco Use: Medium Risk (01/17/2024)   Patient History    Smoking Tobacco Use: Former    Smokeless Tobacco Use: Never    Passive Exposure: Not on Actuary Strain: Not on file  Food Insecurity: No Food Insecurity (07/28/2022)   Hunger Vital Sign    Worried About Running Out of Food in the Last Year: Never true    Ran Out of Food in the Last Year: Never true   Transportation Needs: No Transportation Needs (07/28/2022)   PRAPARE - Administrator, Civil Service (Medical): No    Lack of Transportation (Non-Medical): No  Physical Activity: Not on file  Stress: Not on file  Social Connections: Not on file  Intimate Partner Violence: Not At Risk (07/28/2022)   Humiliation, Afraid, Rape, and Kick questionnaire    Fear of Current or Ex-Partner: No    Emotionally Abused: No    Physically Abused: No    Sexually Abused: No  Depression (PHQ2-9): Not on file  Alcohol Screen: Not on file  Housing: Low Risk (07/28/2022)   Housing    Last Housing Risk Score: 0  Utilities: Not At Risk (07/28/2022)   A[facility] Utilities    Threatened with loss of utilities: No  Health  Literacy: Not on file    Review of Systems   See HPI, otherwise negative ROS  Physical Exam: BP 108/72   Pulse 82   Temp 98.1 F (36.7 C) (Oral)   Ht 5' 10 (1.778 m)   Wt 185 lb 9.6 oz (84.2 kg)   SpO2 99%   BMI 26.63 kg/m  General:   Alert,  Well-developed, well-nourished, pleasant and cooperative in NAD Lungs:  Clear throughout to auscultation.   No wheezes, crackles, or rhonchi. No acute distress. Heart:  Regular rate and rhythm; no murmurs, clicks, rubs,  or gallops. Abdomen: Non-distended, normal bowel sounds.  Soft and nontender without appreciable mass or hepatosplenomegaly.  Pulses:  Normal pulses noted. Extremities:  Without clubbing or edema.   Impression/Plan:   Pleasant 79 year old gentleman multiple comorbidities including longstanding diabetes history of chronic diarrhea while on metformin as Ozempic  was added to his regimen diarrhea actually improved but he still has episodes of urgency much of the time without a productive bowel movement when he gets the call to stool.  I am suspicious metformin may be a contributing factor to his bowel symptoms.  Addition of Ozempic  is likely compensated for his episodes of diarrhea but he still has urgency and  hyperdefecation without frank diarrhea  Recommendations: I would like you to ask your endocrinologist to stop the metformin for 3 months as a therapeutic trial  For now we will also add dicyclomine  or Bentyl  10 mg tablet each morning to your regimen to decrease episodes of urgency (dispense 30 with 3 refills)  Continue Protonix  40 mg daily for reflux  Will plan to see you back in the office in 3 months  Notice: This dictation was prepared with Dragon dictation along with smaller phrase technology. Any transcriptional errors that result from this process are unintentional and may not be corrected upon review.  "

## 2024-01-17 NOTE — Addendum Note (Signed)
 Addended by: WELLINGTON MILLING on: 01/17/2024 04:14 PM   Modules accepted: Orders

## 2024-01-18 ENCOUNTER — Telehealth: Payer: Self-pay | Admitting: Pharmacy Technician

## 2024-01-18 NOTE — Telephone Encounter (Signed)
 ERROR

## 2024-01-18 NOTE — Telephone Encounter (Addendum)
 Auth Submission: APPROVED Site of care: Site of care: [facility] Payer: [facility] Medicare Medication & CPT/J Code(s) submitted: Leqvio  (Inclisiran) J1306 Diagnosis Code: E78.00 Route of submission (phone, fax, portal): portal Phone # Fax # Auth type: Buy/Bill PB Units/visits requested: 284mg  q51mo Reference number: [id number] Approval from: 01/20/2024 to 01/19/2025

## 2024-01-25 ENCOUNTER — Encounter: Payer: Self-pay | Admitting: Endocrinology

## 2024-03-06 ENCOUNTER — Ambulatory Visit

## 2024-03-15 ENCOUNTER — Ambulatory Visit: Admitting: Urology
